# Patient Record
Sex: Male | Born: 1950 | Race: Black or African American | Hispanic: No | Marital: Married | Smoking: Former smoker
Health system: Southern US, Community
[De-identification: ages and names within clinical notes are randomized; demographics above are authoritative.]

## PROBLEM LIST (undated history)

## (undated) DIAGNOSIS — M1990 Unspecified osteoarthritis, unspecified site: Secondary | ICD-10-CM

## (undated) DIAGNOSIS — Z720 Tobacco use: Secondary | ICD-10-CM

## (undated) DIAGNOSIS — I10 Essential (primary) hypertension: Secondary | ICD-10-CM

## (undated) DIAGNOSIS — C61 Malignant neoplasm of prostate: Secondary | ICD-10-CM

## (undated) HISTORY — PX: PROSTATE BIOPSY: SHX241

## (undated) HISTORY — PX: COLONOSCOPY: SHX174

## (undated) HISTORY — DX: Tobacco use: Z72.0

## (undated) HISTORY — DX: Malignant neoplasm of prostate: C61

## (undated) HISTORY — PX: OTHER SURGICAL HISTORY: SHX169

## (undated) HISTORY — PX: ROTATOR CUFF REPAIR: SHX139

## (undated) HISTORY — PX: BACK SURGERY: SHX140

## (undated) HISTORY — PX: WRIST SURGERY: SHX841

---

## 1997-07-14 ENCOUNTER — Ambulatory Visit (HOSPITAL_COMMUNITY): Admission: RE | Admit: 1997-07-14 | Discharge: 1997-07-14 | Payer: Self-pay | Admitting: Cardiology

## 1997-07-15 ENCOUNTER — Ambulatory Visit (HOSPITAL_COMMUNITY): Admission: RE | Admit: 1997-07-15 | Discharge: 1997-07-15 | Payer: Self-pay | Admitting: Cardiology

## 1997-12-13 ENCOUNTER — Encounter: Payer: Self-pay | Admitting: Neurosurgery

## 1997-12-13 ENCOUNTER — Inpatient Hospital Stay (HOSPITAL_COMMUNITY): Admission: RE | Admit: 1997-12-13 | Discharge: 1997-12-16 | Payer: Self-pay | Admitting: Neurosurgery

## 1997-12-30 ENCOUNTER — Emergency Department (HOSPITAL_COMMUNITY): Admission: EM | Admit: 1997-12-30 | Discharge: 1997-12-30 | Payer: Self-pay | Admitting: Emergency Medicine

## 1999-04-30 ENCOUNTER — Encounter: Payer: Self-pay | Admitting: Cardiology

## 1999-04-30 ENCOUNTER — Encounter: Admission: RE | Admit: 1999-04-30 | Discharge: 1999-04-30 | Payer: Self-pay | Admitting: Cardiology

## 1999-06-03 ENCOUNTER — Encounter: Payer: Self-pay | Admitting: Neurosurgery

## 1999-06-03 ENCOUNTER — Ambulatory Visit (HOSPITAL_COMMUNITY): Admission: RE | Admit: 1999-06-03 | Discharge: 1999-06-03 | Payer: Self-pay | Admitting: Neurosurgery

## 2000-05-01 ENCOUNTER — Encounter: Admission: RE | Admit: 2000-05-01 | Discharge: 2000-05-01 | Payer: Self-pay | Admitting: Cardiology

## 2000-05-01 ENCOUNTER — Encounter: Payer: Self-pay | Admitting: Cardiology

## 2003-02-25 ENCOUNTER — Encounter: Admission: RE | Admit: 2003-02-25 | Discharge: 2003-02-25 | Payer: Self-pay | Admitting: Cardiology

## 2003-05-20 ENCOUNTER — Ambulatory Visit (HOSPITAL_BASED_OUTPATIENT_CLINIC_OR_DEPARTMENT_OTHER): Admission: RE | Admit: 2003-05-20 | Discharge: 2003-05-20 | Payer: Self-pay | Admitting: Urology

## 2003-05-20 ENCOUNTER — Ambulatory Visit (HOSPITAL_COMMUNITY): Admission: RE | Admit: 2003-05-20 | Discharge: 2003-05-20 | Payer: Self-pay | Admitting: Urology

## 2007-02-26 ENCOUNTER — Encounter: Admission: RE | Admit: 2007-02-26 | Discharge: 2007-02-26 | Payer: Self-pay | Admitting: Cardiology

## 2009-06-28 ENCOUNTER — Ambulatory Visit (HOSPITAL_BASED_OUTPATIENT_CLINIC_OR_DEPARTMENT_OTHER): Admission: RE | Admit: 2009-06-28 | Discharge: 2009-06-28 | Payer: Self-pay | Admitting: Orthopedic Surgery

## 2010-04-16 LAB — POCT HEMOGLOBIN-HEMACUE: Hemoglobin: 15.7 g/dL (ref 13.0–17.0)

## 2010-04-16 LAB — BASIC METABOLIC PANEL
BUN: 12 mg/dL (ref 6–23)
CO2: 25 mEq/L (ref 19–32)
Calcium: 9.2 mg/dL (ref 8.4–10.5)
Chloride: 109 mEq/L (ref 96–112)
Creatinine, Ser: 0.95 mg/dL (ref 0.4–1.5)
GFR calc Af Amer: >60 mL/min (ref >60)
GFR calc non Af Amer: >60 mL/min (ref >60)
Glucose, Bld: 86 mg/dL (ref 70–99)
Potassium: 3.5 mEq/L (ref 3.5–5.1)
Sodium: 139 mEq/L (ref 135–145)

## 2010-06-15 NOTE — Op Note (Signed)
Jason [name], Jason [name]                         ACCOUNT NO.:  [id number]   MEDICAL RECORD NO.:  [id number]                   PATIENT TYPE:  AMB   LOCATION:  [facility]                                 FACILITY:  [facility]   PHYSICIAN:  Lindaann Slough, M.D.               DATE OF BIRTH:  05-27-1950   DATE OF PROCEDURE:  05/20/2003  DATE OF DISCHARGE:                                 OPERATIVE REPORT   PREOPERATIVE DIAGNOSIS:  Left hydrocele.   POSTOPERATIVE DIAGNOSIS:  Left hydrocele.   PROCEDURE:  Left hydrocelectomy.   SURGEON:  Lindaann Slough, M.D.   ANESTHESIA:  General.   INDICATIONS:  Patient is a 60 year old male who had been complaining of  swelling on the left scrotum, associated with pain.  He was found on  physical examination to have a large left hydrocele.  He is scheduled today  for a left hydrocelectomy.   Under general anesthesia, the patient was prepped and draped and placed in  the supine position.  The scrotum was infiltrated with 0.25% Marcaine.  Then  a longitudinal incision was made on the scrotum.  The incision was carried  down the tunica vaginalis, which was then incised.  About 500 cc of fluid  was drained out of the hydrocele sac.  Then the tunica vaginalis was  plicated using the Lord's technique with #3-0 chromic.  Hemostasis was  secured with electrocautery.  Then the scrotum was closed in two layers with  3-0 chromic.   Patient tolerated the procedure well and left the OR in satisfactory  condition to post anesthesia care unit.                                               Lindaann Slough, M.D.    MN/MEDQ  D:  05/20/2003  T:  05/20/2003  Job:  [id number]   cc:   Osvaldo Shipper. Spruill, M.D.  [address] [address]  [place]  [place] [address]  Fax: [phone]

## 2011-12-19 ENCOUNTER — Other Ambulatory Visit: Payer: Self-pay | Admitting: Cardiology

## 2011-12-19 ENCOUNTER — Ambulatory Visit
Admission: RE | Admit: 2011-12-19 | Discharge: 2011-12-19 | Disposition: A | Discharge status: Home / Self Care | Payer: BC Managed Care – PPO | Source: Ambulatory Visit | Attending: Cardiology | Admitting: Cardiology

## 2011-12-19 DIAGNOSIS — I10 Essential (primary) hypertension: Secondary | ICD-10-CM

## 2012-07-20 ENCOUNTER — Other Ambulatory Visit: Payer: Self-pay | Admitting: Cardiology

## 2012-07-20 DIAGNOSIS — M25511 Pain in right shoulder: Secondary | ICD-10-CM

## 2012-07-24 ENCOUNTER — Ambulatory Visit
Admission: RE | Admit: 2012-07-24 | Discharge: 2012-07-24 | Disposition: A | Discharge status: Home / Self Care | Payer: BC Managed Care – PPO | Source: Ambulatory Visit | Attending: Cardiology | Admitting: Cardiology

## 2012-07-24 DIAGNOSIS — M25511 Pain in right shoulder: Secondary | ICD-10-CM

## 2013-10-25 ENCOUNTER — Ambulatory Visit
Admission: RE | Admit: 2013-10-25 | Discharge: 2013-10-25 | Disposition: A | Discharge status: Home / Self Care | Payer: BC Managed Care – PPO | Source: Ambulatory Visit | Attending: Cardiology | Admitting: Cardiology

## 2013-10-25 ENCOUNTER — Other Ambulatory Visit: Payer: Self-pay | Admitting: Cardiology

## 2013-10-25 DIAGNOSIS — I158 Other secondary hypertension: Secondary | ICD-10-CM

## 2018-01-05 DIAGNOSIS — I10 Essential (primary) hypertension: Secondary | ICD-10-CM | POA: Diagnosis not present

## 2018-01-05 DIAGNOSIS — J209 Acute bronchitis, unspecified: Secondary | ICD-10-CM | POA: Diagnosis not present

## 2018-02-03 DIAGNOSIS — Z1211 Encounter for screening for malignant neoplasm of colon: Secondary | ICD-10-CM | POA: Diagnosis not present

## 2018-02-03 DIAGNOSIS — Z23 Encounter for immunization: Secondary | ICD-10-CM | POA: Diagnosis not present

## 2018-02-03 DIAGNOSIS — Z720 Tobacco use: Secondary | ICD-10-CM | POA: Diagnosis not present

## 2018-02-03 DIAGNOSIS — Z79899 Other long term (current) drug therapy: Secondary | ICD-10-CM | POA: Diagnosis not present

## 2018-02-03 DIAGNOSIS — I10 Essential (primary) hypertension: Secondary | ICD-10-CM | POA: Diagnosis not present

## 2018-09-15 DIAGNOSIS — R972 Elevated prostate specific antigen [PSA]: Secondary | ICD-10-CM | POA: Diagnosis not present

## 2018-10-16 DIAGNOSIS — D125 Benign neoplasm of sigmoid colon: Secondary | ICD-10-CM | POA: Diagnosis not present

## 2018-10-16 DIAGNOSIS — K5730 Diverticulosis of large intestine without perforation or abscess without bleeding: Secondary | ICD-10-CM | POA: Diagnosis not present

## 2018-10-16 DIAGNOSIS — D123 Benign neoplasm of transverse colon: Secondary | ICD-10-CM | POA: Diagnosis not present

## 2018-10-16 DIAGNOSIS — D122 Benign neoplasm of ascending colon: Secondary | ICD-10-CM | POA: Diagnosis not present

## 2018-10-16 DIAGNOSIS — K635 Polyp of colon: Secondary | ICD-10-CM | POA: Diagnosis not present

## 2018-10-16 DIAGNOSIS — Z86010 Personal history of colonic polyps: Secondary | ICD-10-CM | POA: Diagnosis not present

## 2019-06-24 DIAGNOSIS — F17200 Nicotine dependence, unspecified, uncomplicated: Secondary | ICD-10-CM | POA: Insufficient documentation

## 2019-06-24 DIAGNOSIS — J301 Allergic rhinitis due to pollen: Secondary | ICD-10-CM | POA: Insufficient documentation

## 2020-04-12 ENCOUNTER — Other Ambulatory Visit: Payer: Self-pay | Admitting: *Deleted

## 2020-04-12 DIAGNOSIS — F17210 Nicotine dependence, cigarettes, uncomplicated: Secondary | ICD-10-CM

## 2020-04-12 DIAGNOSIS — Z87891 Personal history of nicotine dependence: Secondary | ICD-10-CM

## 2020-05-05 ENCOUNTER — Other Ambulatory Visit: Payer: Self-pay | Admitting: Nephrology

## 2020-05-05 DIAGNOSIS — N1832 Chronic kidney disease, stage 3b: Secondary | ICD-10-CM

## 2020-05-18 ENCOUNTER — Other Ambulatory Visit: Payer: BC Managed Care – PPO

## 2020-05-22 ENCOUNTER — Other Ambulatory Visit: Payer: Self-pay

## 2020-05-22 ENCOUNTER — Ambulatory Visit (INDEPENDENT_AMBULATORY_CARE_PROVIDER_SITE_OTHER): Payer: BC Managed Care – PPO | Admitting: Acute Care

## 2020-05-22 ENCOUNTER — Encounter: Payer: Self-pay | Admitting: Acute Care

## 2020-05-22 ENCOUNTER — Telehealth: Payer: Self-pay | Admitting: Acute Care

## 2020-05-22 ENCOUNTER — Ambulatory Visit
Admission: RE | Admit: 2020-05-22 | Discharge: 2020-05-22 | Disposition: A | Discharge status: Home / Self Care | Payer: BC Managed Care – PPO | Source: Ambulatory Visit | Attending: Acute Care | Admitting: Acute Care

## 2020-05-22 VITALS — BP 118/74 | HR 102 | Temp 98.0°F | Ht 70.0 in | Wt 187.4 lb

## 2020-05-22 DIAGNOSIS — F17210 Nicotine dependence, cigarettes, uncomplicated: Secondary | ICD-10-CM

## 2020-05-22 DIAGNOSIS — Z87891 Personal history of nicotine dependence: Secondary | ICD-10-CM

## 2020-05-22 DIAGNOSIS — Z122 Encounter for screening for malignant neoplasm of respiratory organs: Secondary | ICD-10-CM

## 2020-05-22 NOTE — Progress Notes (Signed)
Shared Decision Making Visit Lung Cancer Screening Program [phone])   Eligibility:  Age 70 y.o.  Pack Years Smoking History Calculation 52 pack year smoking history (# packs/per year x # years smoked)  Recent History of coughing up blood  no  Unexplained weight loss? no ( >Than 15 pounds within the last 6 months )  Prior History Lung / other cancer no (Diagnosis within the last 5 years already requiring surveillance chest CT Scans).  Smoking Status Current Smoker  Former Smokers: Years since quit: NA  Quit Date: NA  Visit Components:  Discussion included one or more decision making aids. yes  Discussion included risk/benefits of screening. yes  Discussion included potential follow up diagnostic testing for abnormal scans. yes  Discussion included meaning and risk of over diagnosis. yes  Discussion included meaning and risk of False Positives. yes  Discussion included meaning of total radiation exposure. yes  Counseling Included:  Importance of adherence to annual lung cancer LDCT screening. yes  Impact of comorbidities on ability to participate in the program. yes  Ability and willingness to under diagnostic treatment. yes  Smoking Cessation Counseling:  Current Smokers:   Discussed importance of smoking cessation. yes  Information about tobacco cessation classes and interventions provided to patient. yes  Patient provided with "ticket" for LDCT Scan. yes  Symptomatic Patient. no  Counseling  Diagnosis Code: Tobacco Use Z72.0  Asymptomatic Patient yes  Counseling (Intermediate counseling: > three minutes counseling) N0539  Former Smokers:   Discussed the importance of maintaining cigarette abstinence. yes  Diagnosis Code: Personal History of Nicotine Dependence. J67.341  Information about tobacco cessation classes and interventions provided to patient. Yes  Patient provided with "ticket" for LDCT Scan. yes  Written Order for Lung Cancer  Screening with LDCT placed in Epic. Yes (CT Chest Lung Cancer Screening Low Dose W/O CM) [id number] Z12.2-Screening of respiratory organs Z87.891-Personal history of nicotine dependence\  I have spent 25 minutes of face to face time with Mr. Masini discussing the risks and benefits of lung cancer screening. We viewed a power point together that explained in detail the above noted topics. We paused at intervals to allow for questions to be asked and answered to ensure understanding.We discussed that the single most powerful action that he can take to decrease his risk of developing lung cancer is to quit smoking. We discussed whether or not he is ready to commit to setting a quit date. We discussed options for tools to aid in quitting smoking including nicotine replacement therapy, non-nicotine medications, support groups, Quit Smart classes, and behavior modification. We discussed that often times setting smaller, more achievable goals, such as eliminating 1 cigarette a day for a week and then 2 cigarettes a day for a week can be helpful in slowly decreasing the number of cigarettes smoked. This allows for a sense of accomplishment as well as providing a clinical benefit. I gave him the " Be Stronger Than Your Excuses" card with contact information for community resources, classes, free nicotine replacement therapy, and access to mobile apps, text messaging, and on-line smoking cessation help. I have also given him my card and contact information in the event he needs to contact me. We discussed the time and location of the scan, and that either Doroteo Glassman RN or I will call with the results within 24-48 hours of receiving them. I have offered him  a copy of the power point we viewed  as a resource in the event they need reinforcement of  the concepts we discussed today in the office. The patient verbalized understanding of all of  the above and had no further questions upon leaving the office. They have my  contact information in the event they have any further questions.  I spent 3-4 minutes counseling on smoking cessation and the health risks of continued tobacco abuse.  I explained to the patient that there has been a high incidence of coronary artery disease noted on these exams. I explained that this is a non-gated exam therefore degree or severity cannot be determined. This patient is currently on statin therapy. I have asked the patient to follow-up with their PCP regarding any incidental finding of coronary artery disease and management with diet or medication as their PCP  feels is clinically indicated. The patient verbalized understanding of the above and had no further questions upon completion of the visit.      Magdalen Spatz, NP 05/22/2020 9:40 AM

## 2020-05-22 NOTE — Telephone Encounter (Signed)
Spoke with Olivia Mackie from [place]. She was calling in regards to patient's lung cancer screening CT. Below are the impressions:   IMPRESSION: 1. Spiculated 21.7 mm apical segment right upper lobe nodule, highly worrisome for adenocarcinoma. Lung-RADS 4X, highly suspicious. Additional imaging evaluation or consultation with Pulmonology or Thoracic Surgery recommended. These results will be called to the ordering clinician or representative by the Radiologist Assistant, and communication documented in the PACS or [employer]. 2. Enlarged low right paratracheal and AP window lymph nodes, worrisome for metastatic disease. Enlarged mediastinal lymph nodes can also be seen in the setting of interstitial lung disease. 3. Lower lung zone predominant coarsened pulmonary parenchymal ground-glass and bronchiectasis, findings which can be seen with interstitial lung disease such as chronic hypersensitivity pneumonitis or nonspecific interstitial pneumonitis. 4.  Aortic atherosclerosis (ICD10-I70.0). 5.  Emphysema (ICD10-J43.9).  SG, can you please advise? Thanks!

## 2020-05-22 NOTE — Patient Instructions (Signed)
Thank you for participating in the [facility] Lung Cancer Screening Program. It was our pleasure to meet you today. We will call you with the results of your scan within the next few days. Your scan will be assigned a Lung RADS category score by the physicians reading the scans.  This Lung RADS score determines follow up scanning.  See below for description of categories, and follow up screening recommendations. We will be in touch to schedule your follow up screening annually or based on recommendations of our providers. We will fax a copy of your scan results to your Primary Care Physician, or the physician who referred you to the program, to ensure they have the results. Please call the office if you have any questions or concerns regarding your scanning experience or results.  Our office number is [phone]. Please speak with Denise Phelps, RN. She is our Lung Cancer Screening RN. If she is unavailable when you call, please have the office staff send her a message. She will return your call at her earliest convenience. Remember, if your scan is normal, we will scan you annually as long as you continue to meet the criteria for the program. (Age 55-77, Current smoker or smoker who has quit within the last 15 years). If you are a smoker, remember, quitting is the single most powerful action that you can take to decrease your risk of lung cancer and other pulmonary, breathing related problems. We know quitting is hard, and we are here to help.  Please let us know if there is anything we can do to help you meet your goal of quitting. If you are a former smoker, congratulations. We are proud of you! Remain smoke free! Remember you can refer friends or family members through the number above.  We will screen them to make sure they meet criteria for the program. Thank you for helping us take better care of you by participating in Lung Screening.  Lung RADS Categories:  Lung RADS 1: no nodules  or definitely non-concerning nodules.  Recommendation is for a repeat annual scan in 12 months.  Lung RADS 2:  nodules that are non-concerning in appearance and behavior with a very low likelihood of becoming an active cancer. Recommendation is for a repeat annual scan in 12 months.  Lung RADS 3: nodules that are probably non-concerning , includes nodules with a low likelihood of becoming an active cancer.  Recommendation is for a 6-month repeat screening scan. Often noted after an upper respiratory illness. We will be in touch to make sure you have no questions, and to schedule your 6-month scan.  Lung RADS 4 A: nodules with concerning findings, recommendation is most often for a follow up scan in 3 months or additional testing based on our provider's assessment of the scan. We will be in touch to make sure you have no questions and to schedule the recommended 3 month follow up scan.  Lung RADS 4 B:  indicates findings that are concerning. We will be in touch with you to schedule additional diagnostic testing based on our provider's  assessment of the scan.   

## 2020-05-23 ENCOUNTER — Other Ambulatory Visit: Payer: Self-pay | Admitting: Acute Care

## 2020-05-23 DIAGNOSIS — R911 Solitary pulmonary nodule: Secondary | ICD-10-CM

## 2020-05-23 NOTE — Progress Notes (Signed)
I have called the patient with the results of his low dose Ct. I explained that his scan was read as a Lung RADS 4 X which  indicates highly suspicious findings for which additional diagnostic testing and or tissue sampling is recommended. I explained that the next steps in his work up would be a PET scan and PFT's.I told him someone would be calling to get these scheduled as soon as possible. I have placed the orders. He has verbalized understanding, and had no questions prior to completion of the call. He is awaiting a call to schedule additional diagnostics. Langley Gauss, please fax results to PCP and let them know we are handling additional diagnostic work up. Thanks so much

## 2020-05-23 NOTE — Telephone Encounter (Signed)
This will be called through the screening program. Thanks so much. 

## 2020-05-23 NOTE — Progress Notes (Signed)
I have attempted to call the patient with the results of his low dose CT. There was no answer. I have asked the patient to call the office at [phone] for the results of his scan. I will await his return call.   Langley Gauss, if he calls the office today, please text me so I can return his call in a timely manner. Thanks so much

## 2020-05-23 NOTE — Telephone Encounter (Signed)
Noted.  Will close encounter.  

## 2020-05-25 ENCOUNTER — Telehealth: Payer: Self-pay | Admitting: Acute Care

## 2020-05-25 NOTE — Telephone Encounter (Signed)
Left message for pt to call to schedule PFT with CT prior.

## 2020-05-29 NOTE — Telephone Encounter (Signed)
Spoke with pt and scheduled Covid test and PFT. Pt verbalized understanding. Nothing further needed.

## 2020-05-29 NOTE — Telephone Encounter (Signed)
LMTC x 1  

## 2020-05-31 ENCOUNTER — Other Ambulatory Visit: Payer: Self-pay

## 2020-05-31 ENCOUNTER — Ambulatory Visit (INDEPENDENT_AMBULATORY_CARE_PROVIDER_SITE_OTHER): Payer: Medicare Other | Admitting: Pulmonary Disease

## 2020-05-31 ENCOUNTER — Encounter: Payer: Self-pay | Admitting: Pulmonary Disease

## 2020-05-31 ENCOUNTER — Telehealth: Payer: Self-pay | Admitting: Pulmonary Disease

## 2020-05-31 VITALS — BP 116/64 | HR 68 | Temp 98.1°F | Ht 70.0 in | Wt 190.0 lb

## 2020-05-31 DIAGNOSIS — Z87891 Personal history of nicotine dependence: Secondary | ICD-10-CM

## 2020-05-31 DIAGNOSIS — R918 Other nonspecific abnormal finding of lung field: Secondary | ICD-10-CM

## 2020-05-31 DIAGNOSIS — R590 Localized enlarged lymph nodes: Secondary | ICD-10-CM

## 2020-05-31 DIAGNOSIS — J432 Centrilobular emphysema: Secondary | ICD-10-CM

## 2020-05-31 NOTE — Patient Instructions (Signed)
Thank you for visiting Dr. Valeta Harms at [facility]. Today we recommend the following:  Orders Placed This Encounter  Procedures  . Procedural/ Surgical Case Request: VIDEO BRONCHOSCOPY WITH ENDOBRONCHIAL NAVIGATION, VIDEO BRONCHOSCOPY WITH ENDOBRONCHIAL ULTRASOUND  . Ambulatory referral to Pulmonology   Tentative Date: 06/06/2020 for bronchoscopy  Expect calls from scheduling as well as pre-op services.   Return in about 6 weeks (around 07/12/2020).    Please do your part to reduce the spread of COVID-19.

## 2020-05-31 NOTE — Telephone Encounter (Signed)
I scheduled pt for ENB/EBUS on 5/10 at 11:00 at [facility].  Pt scheduled for covid test on 5/9.  CT is scheduled for 5/6 at [facility].  I called & spoke to [facility] and she is going to have disk sent to [facility].  I spoke to pt & gave him appt info.

## 2020-05-31 NOTE — H&P (View-Only) (Signed)
Synopsis: Referred in May 2022 for lung nodule by Lujean Amel, MD  Subjective:   PATIENT ID: Jason [name]. GENDER: male DOB: November 07, 1950, MRN: [id number]  Chief Complaint  Patient presents with  . Consult    Pt is being referred by Eric Form, NP due to lung mass seen on CT.  Pt denies any complaints of SOB. States that he does have an occasional cough.    70 yo M, PMH prostate cancer (recent dx),, tobacco abuse, smoker for 30 years.  History of back surgery.  No family history of lung cancer.  Patient was enrolled in our lung cancer screening program.  First lung cancer screening image was 05/22/2020 which revealed a right upper lobe 2.1 cm cavitary nodule with associated paraseptal emphysema and a low right paratracheal lymph node measuring 11 mm.  Concerning for a primary bronchogenic carcinoma.  Patient has also associated centrilobular emphysema.  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  Patient denies shortness of breath or weight loss.  He has had general anesthesia before for a back surgery.  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     Past Medical History:  Diagnosis Date  . Prostate cancer ([place])   . Tobacco use      Family History  Problem Relation Age of Onset  . Heart failure Mother      Past Surgical History:  Procedure Laterality Date  . BACK SURGERY    . PROSTATE BIOPSY      Social History   Socioeconomic History  . Marital status: Married    Spouse name: Not on file  . Number of children: Not on file  . Years of education: Not on file  . Highest education level: Not on file  Occupational History  . Not on file  Tobacco Use  . Smoking status: Former Smoker    Packs/day: 1.50    Years: 35.00    Pack years: 52.50    Types: Cigarettes    Start date: 80    Quit date: 05/26/2020    Years since quitting: 0.0  . Smokeless tobacco: Never Used  Substance and Sexual Activity  .  Alcohol use: Not on file  . Drug use: Not on file  . Sexual activity: Not on file  Other Topics Concern  . Not on file  Social History Narrative  . Not on file   Social Determinants of Health   Financial Resource Strain: Not on file  Food Insecurity: Not on file  Transportation Needs: Not on file  Physical Activity: Not on file  Stress: Not on file  Social Connections: Not on file  Intimate Partner Violence: Not on file     Not on File   Outpatient Medications Prior to Visit  Medication Sig Dispense Refill  . atorvastatin (LIPITOR) 10 MG tablet Take 1 tablet by mouth daily.    Marland Kitchen azelastine (OPTIVAR) 0.05 % ophthalmic solution SMARTSIG:1 Drop(s) In Eye(s) Twice Daily PRN    . fluticasone (FLONASE) 50 MCG/ACT nasal spray 2 sprays    . levocetirizine (XYZAL) 5 MG tablet 1 tablet in the evening    . losartan-hydrochlorothiazide (HYZAAR) 100-12.5 MG tablet Take 1 tablet by mouth daily.    . montelukast (SINGULAIR) 10 MG tablet 1 tablet    . valsartan-hydrochlorothiazide (DIOVAN-HCT) 320-25 MG tablet Take 1 tablet by mouth daily.     No facility-administered medications prior to visit.    Review of  Systems  Constitutional: Negative for chills, fever, malaise/fatigue and weight loss.  HENT: Negative for hearing loss, sore throat and tinnitus.   Eyes: Negative for blurred vision and double vision.  Respiratory: Negative for cough, hemoptysis, sputum production, shortness of breath, wheezing and stridor.   Cardiovascular: Negative for chest pain, palpitations, orthopnea, leg swelling and PND.  Gastrointestinal: Negative for abdominal pain, constipation, diarrhea, heartburn, nausea and vomiting.  Genitourinary: Negative for dysuria, hematuria and urgency.  Musculoskeletal: Negative for joint pain and myalgias.  Skin: Negative for itching and rash.  Neurological: Negative for dizziness, tingling, weakness and headaches.  Endo/Heme/Allergies: Negative for environmental allergies.  Does not bruise/bleed easily.  Psychiatric/Behavioral: Negative for depression. The patient is not nervous/anxious and does not have insomnia.   All other systems reviewed and are negative.    Objective:  Physical Exam Vitals reviewed.  Constitutional:      General: He is not in acute distress.    Appearance: He is well-developed.  HENT:     Head: Normocephalic and atraumatic.  Eyes:     General: No scleral icterus.    Conjunctiva/sclera: Conjunctivae normal.     Pupils: Pupils are equal, round, and reactive to light.  Neck:     Vascular: No JVD.     Trachea: No tracheal deviation.  Cardiovascular:     Rate and Rhythm: Normal rate and regular rhythm.     Heart sounds: Normal heart sounds. No murmur heard.   Pulmonary:     Effort: Pulmonary effort is normal. No tachypnea, accessory muscle usage or respiratory distress.     Breath sounds: Normal breath sounds. No stridor. No wheezing, rhonchi or rales.  Abdominal:     General: Bowel sounds are normal. There is no distension.     Palpations: Abdomen is soft.     Tenderness: There is no abdominal tenderness.  Musculoskeletal:        General: No tenderness.     Cervical back: Neck supple.  Lymphadenopathy:     Cervical: No cervical adenopathy.  Skin:    General: Skin is warm and dry.     Capillary Refill: Capillary refill takes less than 2 seconds.     Findings: No rash.  Neurological:     Mental Status: He is alert and oriented to person, place, and time.  Psychiatric:        Behavior: Behavior normal.      Vitals:   05/31/20 1339  BP: 116/64  Pulse: 68  Temp: 98.1 F (36.7 C)  TempSrc: Temporal  SpO2: 97%  Weight: 190 lb (86.2 kg)  Height: 5\' 10"  (1.778 m)   97% on RA BMI Readings from Last 3 Encounters:  05/31/20 27.26 kg/m  05/22/20 26.89 kg/m   Wt Readings from Last 3 Encounters:  05/31/20 190 lb (86.2 kg)  05/22/20 187 lb 6.4 oz (85 kg)     CBC    Component Value Date/Time   HGB 15.7  06/28/2009 0804    Chest Imaging: 05/22/2020: CT lung cancer screening 2.1 cm right upper lobe apical cavitary nodule concerning for primary bronchogenic carcinoma with associated mediastinal adenopathy concerning for an advanced stage disease. The patient's images have been independently reviewed by me.    Pulmonary Functions Testing Results: No flowsheet data found.  FeNO:   Pathology:   Echocardiogram:   Heart Catheterization:     Assessment & Plan:     ICD-10-CM   1. Lung mass  R91.8 Ambulatory referral to Pulmonology    Procedural/  Surgical Case Request: VIDEO BRONCHOSCOPY WITH ENDOBRONCHIAL NAVIGATION, VIDEO BRONCHOSCOPY WITH ENDOBRONCHIAL ULTRASOUND  2. Mediastinal adenopathy  R59.0   3. Former smoker  Z87.891   4. Centrilobular emphysema ([place])  J43.2     Discussion:  This is a 70 year old gentleman, recent diagnosis of a 2 cm right upper lobe lung nodule with associated mediastinal adenopathy concerning for an advanced age bronchogenic carcinoma.  Also has associated centrilobular emphysema.  He is a former smoker recently quit after his CT scan findings.  Also with a new diagnosis of prostate cancer.  Plan: Discussed risks benefits and alternatives to bronchoscopy  PET scan scheduled  SUPER D CT scheduled  Tentative bronch on 06/06/2020 Orders placed.  Plans for ENB + EBUS, if nodes neg and pet re-assuring patient may be a surgical candidate.     Current Outpatient Medications:  .  atorvastatin (LIPITOR) 10 MG tablet, Take 1 tablet by mouth daily., Disp: , Rfl:  .  azelastine (OPTIVAR) 0.05 % ophthalmic solution, SMARTSIG:1 Drop(s) In Eye(s) Twice Daily PRN, Disp: , Rfl:  .  fluticasone (FLONASE) 50 MCG/ACT nasal spray, 2 sprays, Disp: , Rfl:  .  levocetirizine (XYZAL) 5 MG tablet, 1 tablet in the evening, Disp: , Rfl:  .  losartan-hydrochlorothiazide (HYZAAR) 100-12.5 MG tablet, Take 1 tablet by mouth daily., Disp: , Rfl:  .  montelukast (SINGULAIR) 10 MG  tablet, 1 tablet, Disp: , Rfl:  .  valsartan-hydrochlorothiazide (DIOVAN-HCT) 320-25 MG tablet, Take 1 tablet by mouth daily., Disp: , Rfl:   I spent 62 minutes dedicated to the care of this patient on the date of this encounter to include pre-visit review of records, face-to-face time with the patient discussing conditions above, post visit ordering of testing, clinical documentation with the electronic health record, making appropriate referrals as documented, and communicating necessary findings to members of the patients care team.   Garner Nash, [facility] 05/31/2020 2:05 PM

## 2020-05-31 NOTE — Progress Notes (Signed)
Synopsis: Referred in May 2022 for lung nodule by Lujean Amel, MD  Subjective:   PATIENT ID: Jason [name]. GENDER: male DOB: 07-15-1950, MRN: [id number]  Chief Complaint  Patient presents with  . Consult    Pt is being referred by Eric Form, NP due to lung mass seen on CT.  Pt denies any complaints of SOB. States that he does have an occasional cough.    70 yo M, PMH prostate cancer (recent dx),, tobacco abuse, smoker for 30 years.  History of back surgery.  No family history of lung cancer.  Patient was enrolled in our lung cancer screening program.  First lung cancer screening image was 05/22/2020 which revealed a right upper lobe 2.1 cm cavitary nodule with associated paraseptal emphysema and a low right paratracheal lymph node measuring 11 mm.  Concerning for a primary bronchogenic carcinoma.  Patient has also associated centrilobular emphysema.  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  Patient denies shortness of breath or weight loss.  He has had general anesthesia before for a back surgery.  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     Past Medical History:  Diagnosis Date  . Prostate cancer ([place])   . Tobacco use      Family History  Problem Relation Age of Onset  . Heart failure Mother      Past Surgical History:  Procedure Laterality Date  . BACK SURGERY    . PROSTATE BIOPSY      Social History   Socioeconomic History  . Marital status: Married    Spouse name: Not on file  . Number of children: Not on file  . Years of education: Not on file  . Highest education level: Not on file  Occupational History  . Not on file  Tobacco Use  . Smoking status: Former Smoker    Packs/day: 1.50    Years: 35.00    Pack years: 52.50    Types: Cigarettes    Start date: 62    Quit date: 05/26/2020    Years since quitting: 0.0  . Smokeless tobacco: Never Used  Substance and Sexual Activity  .  Alcohol use: Not on file  . Drug use: Not on file  . Sexual activity: Not on file  Other Topics Concern  . Not on file  Social History Narrative  . Not on file   Social Determinants of Health   Financial Resource Strain: Not on file  Food Insecurity: Not on file  Transportation Needs: Not on file  Physical Activity: Not on file  Stress: Not on file  Social Connections: Not on file  Intimate Partner Violence: Not on file     Not on File   Outpatient Medications Prior to Visit  Medication Sig Dispense Refill  . atorvastatin (LIPITOR) 10 MG tablet Take 1 tablet by mouth daily.    Marland Kitchen azelastine (OPTIVAR) 0.05 % ophthalmic solution SMARTSIG:1 Drop(s) In Eye(s) Twice Daily PRN    . fluticasone (FLONASE) 50 MCG/ACT nasal spray 2 sprays    . levocetirizine (XYZAL) 5 MG tablet 1 tablet in the evening    . losartan-hydrochlorothiazide (HYZAAR) 100-12.5 MG tablet Take 1 tablet by mouth daily.    . montelukast (SINGULAIR) 10 MG tablet 1 tablet    . valsartan-hydrochlorothiazide (DIOVAN-HCT) 320-25 MG tablet Take 1 tablet by mouth daily.     No facility-administered medications prior to visit.    Review of  Systems  Constitutional: Negative for chills, fever, malaise/fatigue and weight loss.  HENT: Negative for hearing loss, sore throat and tinnitus.   Eyes: Negative for blurred vision and double vision.  Respiratory: Negative for cough, hemoptysis, sputum production, shortness of breath, wheezing and stridor.   Cardiovascular: Negative for chest pain, palpitations, orthopnea, leg swelling and PND.  Gastrointestinal: Negative for abdominal pain, constipation, diarrhea, heartburn, nausea and vomiting.  Genitourinary: Negative for dysuria, hematuria and urgency.  Musculoskeletal: Negative for joint pain and myalgias.  Skin: Negative for itching and rash.  Neurological: Negative for dizziness, tingling, weakness and headaches.  Endo/Heme/Allergies: Negative for environmental allergies.  Does not bruise/bleed easily.  Psychiatric/Behavioral: Negative for depression. The patient is not nervous/anxious and does not have insomnia.   All other systems reviewed and are negative.    Objective:  Physical Exam Vitals reviewed.  Constitutional:      General: He is not in acute distress.    Appearance: He is well-developed.  HENT:     Head: Normocephalic and atraumatic.  Eyes:     General: No scleral icterus.    Conjunctiva/sclera: Conjunctivae normal.     Pupils: Pupils are equal, round, and reactive to light.  Neck:     Vascular: No JVD.     Trachea: No tracheal deviation.  Cardiovascular:     Rate and Rhythm: Normal rate and regular rhythm.     Heart sounds: Normal heart sounds. No murmur heard.   Pulmonary:     Effort: Pulmonary effort is normal. No tachypnea, accessory muscle usage or respiratory distress.     Breath sounds: Normal breath sounds. No stridor. No wheezing, rhonchi or rales.  Abdominal:     General: Bowel sounds are normal. There is no distension.     Palpations: Abdomen is soft.     Tenderness: There is no abdominal tenderness.  Musculoskeletal:        General: No tenderness.     Cervical back: Neck supple.  Lymphadenopathy:     Cervical: No cervical adenopathy.  Skin:    General: Skin is warm and dry.     Capillary Refill: Capillary refill takes less than 2 seconds.     Findings: No rash.  Neurological:     Mental Status: He is alert and oriented to person, place, and time.  Psychiatric:        Behavior: Behavior normal.      Vitals:   05/31/20 1339  BP: 116/64  Pulse: 68  Temp: 98.1 F (36.7 C)  TempSrc: Temporal  SpO2: 97%  Weight: 190 lb (86.2 kg)  Height: 5\' 10"  (1.778 m)   97% on RA BMI Readings from Last 3 Encounters:  05/31/20 27.26 kg/m  05/22/20 26.89 kg/m   Wt Readings from Last 3 Encounters:  05/31/20 190 lb (86.2 kg)  05/22/20 187 lb 6.4 oz (85 kg)     CBC    Component Value Date/Time   HGB 15.7  06/28/2009 0804    Chest Imaging: 05/22/2020: CT lung cancer screening 2.1 cm right upper lobe apical cavitary nodule concerning for primary bronchogenic carcinoma with associated mediastinal adenopathy concerning for an advanced stage disease. The patient's images have been independently reviewed by me.    Pulmonary Functions Testing Results: No flowsheet data found.  FeNO:   Pathology:   Echocardiogram:   Heart Catheterization:     Assessment & Plan:     ICD-10-CM   1. Lung mass  R91.8 Ambulatory referral to Pulmonology    Procedural/  Surgical Case Request: VIDEO BRONCHOSCOPY WITH ENDOBRONCHIAL NAVIGATION, VIDEO BRONCHOSCOPY WITH ENDOBRONCHIAL ULTRASOUND  2. Mediastinal adenopathy  R59.0   3. Former smoker  Z87.891   4. Centrilobular emphysema ([place])  J43.2     Discussion:  This is a 70 year old gentleman, recent diagnosis of a 2 cm right upper lobe lung nodule with associated mediastinal adenopathy concerning for an advanced age bronchogenic carcinoma.  Also has associated centrilobular emphysema.  He is a former smoker recently quit after his CT scan findings.  Also with a new diagnosis of prostate cancer.  Plan: Discussed risks benefits and alternatives to bronchoscopy  PET scan scheduled  SUPER D CT scheduled  Tentative bronch on 06/06/2020 Orders placed.  Plans for ENB + EBUS, if nodes neg and pet re-assuring patient may be a surgical candidate.     Current Outpatient Medications:  .  atorvastatin (LIPITOR) 10 MG tablet, Take 1 tablet by mouth daily., Disp: , Rfl:  .  azelastine (OPTIVAR) 0.05 % ophthalmic solution, SMARTSIG:1 Drop(s) In Eye(s) Twice Daily PRN, Disp: , Rfl:  .  fluticasone (FLONASE) 50 MCG/ACT nasal spray, 2 sprays, Disp: , Rfl:  .  levocetirizine (XYZAL) 5 MG tablet, 1 tablet in the evening, Disp: , Rfl:  .  losartan-hydrochlorothiazide (HYZAAR) 100-12.5 MG tablet, Take 1 tablet by mouth daily., Disp: , Rfl:  .  montelukast (SINGULAIR) 10 MG  tablet, 1 tablet, Disp: , Rfl:  .  valsartan-hydrochlorothiazide (DIOVAN-HCT) 320-25 MG tablet, Take 1 tablet by mouth daily., Disp: , Rfl:   I spent 62 minutes dedicated to the care of this patient on the date of this encounter to include pre-visit review of records, face-to-face time with the patient discussing conditions above, post visit ordering of testing, clinical documentation with the electronic health record, making appropriate referrals as documented, and communicating necessary findings to members of the patients care team.   Garner Nash, [facility] 05/31/2020 2:05 PM

## 2020-06-02 ENCOUNTER — Ambulatory Visit (HOSPITAL_COMMUNITY)
Admission: RE | Admit: 2020-06-02 | Discharge: 2020-06-02 | Disposition: A | Discharge status: Home / Self Care | Payer: Medicare Other | Source: Ambulatory Visit | Attending: Acute Care | Admitting: Acute Care

## 2020-06-02 ENCOUNTER — Other Ambulatory Visit: Payer: Self-pay

## 2020-06-02 ENCOUNTER — Encounter (HOSPITAL_COMMUNITY): Payer: Self-pay

## 2020-06-02 DIAGNOSIS — R911 Solitary pulmonary nodule: Secondary | ICD-10-CM | POA: Insufficient documentation

## 2020-06-02 DIAGNOSIS — J439 Emphysema, unspecified: Secondary | ICD-10-CM | POA: Insufficient documentation

## 2020-06-02 DIAGNOSIS — I700 Atherosclerosis of aorta: Secondary | ICD-10-CM | POA: Insufficient documentation

## 2020-06-02 DIAGNOSIS — R590 Localized enlarged lymph nodes: Secondary | ICD-10-CM | POA: Diagnosis not present

## 2020-06-02 LAB — GLUCOSE, CAPILLARY: Glucose-Capillary: 90 mg/dL (ref 70–99)

## 2020-06-02 MED ORDER — FLUDEOXYGLUCOSE F - 18 (FDG) INJECTION
9.6300 | Freq: Once | INTRAVENOUS | Status: AC
  Administered 2020-06-02: 9.63 via INTRAVENOUS

## 2020-06-02 NOTE — Progress Notes (Signed)
Pt. Has follow up with Dr. Valeta Harms 5/10 for EBUS for tissue diagnosis.

## 2020-06-05 ENCOUNTER — Other Ambulatory Visit (HOSPITAL_COMMUNITY)
Admission: RE | Admit: 2020-06-05 | Discharge: 2020-06-05 | Disposition: A | Discharge status: Home / Self Care | Payer: Medicare Other | Source: Ambulatory Visit | Attending: Pulmonary Disease | Admitting: Pulmonary Disease

## 2020-06-05 ENCOUNTER — Other Ambulatory Visit (HOSPITAL_COMMUNITY): Payer: BC Managed Care – PPO

## 2020-06-05 ENCOUNTER — Encounter (HOSPITAL_COMMUNITY): Payer: Self-pay | Admitting: Pulmonary Disease

## 2020-06-05 DIAGNOSIS — Z20822 Contact with and (suspected) exposure to covid-19: Secondary | ICD-10-CM | POA: Diagnosis not present

## 2020-06-05 DIAGNOSIS — Z01812 Encounter for preprocedural laboratory examination: Secondary | ICD-10-CM | POA: Insufficient documentation

## 2020-06-05 LAB — SARS CORONAVIRUS 2 (TAT 6-24 HRS): SARS Coronavirus 2: NEGATIVE

## 2020-06-05 NOTE — Progress Notes (Signed)
Spoke with pt for pre-op call. Pt denies cardiac history. Pt is treated for HTN.   Covid test done today, result pending. Pt states he's been in quarantine since the test was done and understands that he stays in quarantine until he comes to the hospital tomorrow.

## 2020-06-06 ENCOUNTER — Encounter (HOSPITAL_COMMUNITY)
Admission: RE | Disposition: A | Discharge status: Home / Self Care | Payer: Self-pay | Source: Home / Self Care | Attending: Pulmonary Disease

## 2020-06-06 ENCOUNTER — Encounter (HOSPITAL_COMMUNITY): Payer: Self-pay | Admitting: Pulmonary Disease

## 2020-06-06 ENCOUNTER — Ambulatory Visit (HOSPITAL_COMMUNITY)
Admission: RE | Admit: 2020-06-06 | Discharge: 2020-06-06 | Disposition: A | Discharge status: Home / Self Care | Payer: Medicare Other | Attending: Pulmonary Disease | Admitting: Pulmonary Disease

## 2020-06-06 ENCOUNTER — Ambulatory Visit (HOSPITAL_COMMUNITY): Payer: Medicare Other | Admitting: Anesthesiology

## 2020-06-06 ENCOUNTER — Ambulatory Visit (HOSPITAL_COMMUNITY): Payer: Medicare Other

## 2020-06-06 ENCOUNTER — Other Ambulatory Visit: Payer: Self-pay

## 2020-06-06 DIAGNOSIS — R918 Other nonspecific abnormal finding of lung field: Secondary | ICD-10-CM | POA: Diagnosis not present

## 2020-06-06 DIAGNOSIS — Z8546 Personal history of malignant neoplasm of prostate: Secondary | ICD-10-CM | POA: Diagnosis not present

## 2020-06-06 DIAGNOSIS — J432 Centrilobular emphysema: Secondary | ICD-10-CM | POA: Diagnosis not present

## 2020-06-06 DIAGNOSIS — Z79899 Other long term (current) drug therapy: Secondary | ICD-10-CM | POA: Diagnosis not present

## 2020-06-06 DIAGNOSIS — R590 Localized enlarged lymph nodes: Secondary | ICD-10-CM | POA: Diagnosis present

## 2020-06-06 DIAGNOSIS — C3411 Malignant neoplasm of upper lobe, right bronchus or lung: Secondary | ICD-10-CM | POA: Insufficient documentation

## 2020-06-06 DIAGNOSIS — Z9889 Other specified postprocedural states: Secondary | ICD-10-CM

## 2020-06-06 DIAGNOSIS — R911 Solitary pulmonary nodule: Secondary | ICD-10-CM | POA: Diagnosis present

## 2020-06-06 DIAGNOSIS — Z87891 Personal history of nicotine dependence: Secondary | ICD-10-CM | POA: Diagnosis not present

## 2020-06-06 HISTORY — DX: Unspecified osteoarthritis, unspecified site: M19.90

## 2020-06-06 HISTORY — PX: BRONCHIAL WASHINGS: SHX5105

## 2020-06-06 HISTORY — PX: BRONCHIAL BIOPSY: SHX5109

## 2020-06-06 HISTORY — PX: BRONCHIAL NEEDLE ASPIRATION BIOPSY: SHX5106

## 2020-06-06 HISTORY — PX: VIDEO BRONCHOSCOPY WITH ENDOBRONCHIAL ULTRASOUND: SHX6177

## 2020-06-06 HISTORY — DX: Essential (primary) hypertension: I10

## 2020-06-06 HISTORY — PX: BRONCHIAL BRUSHINGS: SHX5108

## 2020-06-06 HISTORY — PX: VIDEO BRONCHOSCOPY WITH ENDOBRONCHIAL NAVIGATION: SHX6175

## 2020-06-06 LAB — BASIC METABOLIC PANEL
Anion gap: 5 (ref 5–15)
BUN: 25 mg/dL — ABNORMAL HIGH (ref 8–23)
CO2: 22 mmol/L (ref 22–32)
Calcium: 8.9 mg/dL (ref 8.9–10.3)
Chloride: 108 mmol/L (ref 98–111)
Creatinine, Ser: 1.87 mg/dL — ABNORMAL HIGH (ref 0.61–1.24)
GFR, Estimated: 38 mL/min — ABNORMAL LOW (ref >60)
Glucose, Bld: 97 mg/dL (ref 70–99)
Potassium: 4.2 mmol/L (ref 3.5–5.1)
Sodium: 135 mmol/L (ref 135–145)

## 2020-06-06 SURGERY — VIDEO BRONCHOSCOPY WITH ENDOBRONCHIAL NAVIGATION
Anesthesia: General | Laterality: Right

## 2020-06-06 MED ORDER — ROCURONIUM BROMIDE 10 MG/ML (PF) SYRINGE
PREFILLED_SYRINGE | INTRAVENOUS | Status: DC | PRN
  Administered 2020-06-06: 10 mg via INTRAVENOUS
  Administered 2020-06-06: 60 mg via INTRAVENOUS

## 2020-06-06 MED ORDER — PHENYLEPHRINE 40 MCG/ML (10ML) SYRINGE FOR IV PUSH (FOR BLOOD PRESSURE SUPPORT)
PREFILLED_SYRINGE | INTRAVENOUS | Status: DC | PRN
  Administered 2020-06-06: 60 ug via INTRAVENOUS
  Administered 2020-06-06: 200 ug via INTRAVENOUS
  Administered 2020-06-06: 40 ug via INTRAVENOUS
  Administered 2020-06-06: 120 ug via INTRAVENOUS
  Administered 2020-06-06: 40 ug via INTRAVENOUS

## 2020-06-06 MED ORDER — LACTATED RINGERS IV SOLN: INTRAVENOUS | Status: DC

## 2020-06-06 MED ORDER — CHLORHEXIDINE GLUCONATE 0.12 % MT SOLN
OROMUCOSAL | Status: AC
  Administered 2020-06-06: 15 mL via OROMUCOSAL
  Filled 2020-06-06: qty 15

## 2020-06-06 MED ORDER — FENTANYL CITRATE (PF) 250 MCG/5ML IJ SOLN
INTRAMUSCULAR | Status: DC | PRN
  Administered 2020-06-06: 100 ug via INTRAVENOUS

## 2020-06-06 MED ORDER — MIDAZOLAM HCL 2 MG/2ML IJ SOLN
INTRAMUSCULAR | Status: DC | PRN
  Administered 2020-06-06: 2 mg via INTRAVENOUS

## 2020-06-06 MED ORDER — SUGAMMADEX SODIUM 200 MG/2ML IV SOLN
INTRAVENOUS | Status: DC | PRN
  Administered 2020-06-06: 400 mg via INTRAVENOUS

## 2020-06-06 MED ORDER — ONDANSETRON HCL 4 MG/2ML IJ SOLN
INTRAMUSCULAR | Status: DC | PRN
  Administered 2020-06-06: 4 mg via INTRAVENOUS

## 2020-06-06 MED ORDER — DEXAMETHASONE SODIUM PHOSPHATE 10 MG/ML IJ SOLN
INTRAMUSCULAR | Status: DC | PRN
  Administered 2020-06-06: 10 mg via INTRAVENOUS

## 2020-06-06 MED ORDER — PROPOFOL 10 MG/ML IV BOLUS
INTRAVENOUS | Status: DC | PRN
  Administered 2020-06-06: 20 mg via INTRAVENOUS
  Administered 2020-06-06: 90 mg via INTRAVENOUS

## 2020-06-06 MED ORDER — LIDOCAINE 2% (20 MG/ML) 5 ML SYRINGE
INTRAMUSCULAR | Status: DC | PRN
  Administered 2020-06-06: 60 mg via INTRAVENOUS

## 2020-06-06 MED ORDER — CHLORHEXIDINE GLUCONATE 0.12 % MT SOLN
15.0000 mL | Freq: Once | OROMUCOSAL | Status: AC
  Filled 2020-06-06: qty 15

## 2020-06-06 SURGICAL SUPPLY — 54 items
ADAPTER BRONCH F/PENTAX (ADAPTER) ×4 IMPLANT
ADAPTER VALVE BIOPSY EBUS (MISCELLANEOUS) IMPLANT
ADPR BSCP EDG PNTX (ADAPTER) ×3
ADPTR VALVE BIOPSY EBUS (MISCELLANEOUS)
BRUSH CYTOL CELLEBRITY 1.5X140 (MISCELLANEOUS) ×4 IMPLANT
BRUSH SUPERTRAX BIOPSY (INSTRUMENTS) IMPLANT
BRUSH SUPERTRAX NDL-TIP CYTO (INSTRUMENTS) ×4 IMPLANT
CANISTER SUCT 3000ML PPV (MISCELLANEOUS) ×4 IMPLANT
CHANNEL WORK EXTEND EDGE 180 (KITS) IMPLANT
CHANNEL WORK EXTEND EDGE 45 (KITS) IMPLANT
CHANNEL WORK EXTEND EDGE 90 (KITS) IMPLANT
CONT SPEC 4OZ CLIKSEAL STRL BL (MISCELLANEOUS) ×4 IMPLANT
COVER BACK TABLE 60X90IN (DRAPES) ×4 IMPLANT
COVER DOME SNAP 22 D (MISCELLANEOUS) ×4 IMPLANT
FILTER STRAW FLUID ASPIR (MISCELLANEOUS) IMPLANT
FORCEPS BIOP RJ4 1.8 (CUTTING FORCEPS) IMPLANT
FORCEPS BIOP SUPERTRX PREMAR (INSTRUMENTS) ×4 IMPLANT
GAUZE SPONGE 4X4 12PLY STRL (GAUZE/BANDAGES/DRESSINGS) ×4 IMPLANT
GLOVE BIO SURGEON STRL SZ7.5 (GLOVE) ×4 IMPLANT
GLOVE SURG SS PI 7.5 STRL IVOR (GLOVE) ×8 IMPLANT
GOWN STRL REUS W/ TWL LRG LVL3 (GOWN DISPOSABLE) ×6 IMPLANT
GOWN STRL REUS W/TWL LRG LVL3 (GOWN DISPOSABLE) ×8
KIT CLEAN ENDO COMPLIANCE (KITS) ×8 IMPLANT
KIT LOCATABLE GUIDE (CANNULA) IMPLANT
KIT MARKER FIDUCIAL DELIVERY (KITS) IMPLANT
KIT PROCEDURE EDGE 180 (KITS) IMPLANT
KIT PROCEDURE EDGE 45 (KITS) IMPLANT
KIT PROCEDURE EDGE 90 (KITS) IMPLANT
KIT TURNOVER KIT B (KITS) ×4 IMPLANT
MARKER FIDUCIAL SL 2BAND .9X13 (Implant Marker) IMPLANT
MARKER SKIN DUAL TIP RULER LAB (MISCELLANEOUS) ×4 IMPLANT
NDL EBUS SONO TIP PENTAX (NEEDLE) ×3 IMPLANT
NDL SUPERTRX PREMARK BIOPSY (NEEDLE) ×3 IMPLANT
NEEDLE EBUS SONO TIP PENTAX (NEEDLE) ×4 IMPLANT
NEEDLE SUPERTRX PREMARK BIOPSY (NEEDLE) ×4 IMPLANT
NS IRRIG 1000ML POUR BTL (IV SOLUTION) ×4 IMPLANT
OIL SILICONE PENTAX (PARTS (SERVICE/REPAIRS)) ×4 IMPLANT
PAD ARMBOARD 7.5X6 YLW CONV (MISCELLANEOUS) ×8 IMPLANT
PATCHES PATIENT (LABEL) ×12 IMPLANT
SOL ANTI FOG 6CC (MISCELLANEOUS) ×3 IMPLANT
SOLUTION ANTI FOG 6CC (MISCELLANEOUS) ×1
SYR 20CC LL (SYRINGE) ×8 IMPLANT
SYR 20ML ECCENTRIC (SYRINGE) ×8 IMPLANT
SYR 50ML SLIP (SYRINGE) ×4 IMPLANT
SYR 5ML LUER SLIP (SYRINGE) ×4 IMPLANT
TOWEL OR 17X24 6PK STRL BLUE (TOWEL DISPOSABLE) ×4 IMPLANT
TRAP SPECIMEN MUCOUS 40CC (MISCELLANEOUS) IMPLANT
TUBE CONNECTING 20X1/4 (TUBING) ×8 IMPLANT
UNDERPAD 30X30 (UNDERPADS AND DIAPERS) ×4 IMPLANT
VALVE BIOPSY  SINGLE USE (MISCELLANEOUS) ×4
VALVE BIOPSY SINGLE USE (MISCELLANEOUS) ×3 IMPLANT
VALVE DISPOSABLE (MISCELLANEOUS) ×4 IMPLANT
VALVE SUCTION BRONCHIO DISP (MISCELLANEOUS) ×4 IMPLANT
WATER STERILE IRR 1000ML POUR (IV SOLUTION) ×4 IMPLANT

## 2020-06-06 NOTE — Discharge Instructions (Signed)
Flexible Bronchoscopy, Care After This sheet gives you information about how to care for yourself after your test. Your doctor may also give you more specific instructions. If you have problems or questions, contact your doctor. Follow these instructions at home: Eating and drinking  Do not eat or drink anything (not even water) for 2 hours after your test, or until your numbing medicine (local anesthetic) wears off.  When your numbness is gone and your cough and gag reflexes have come back, you may: ? Eat only soft foods. ? Slowly drink liquids.  The day after the test, go back to your normal diet. Driving  Do not drive for 24 hours if you were given a medicine to help you relax (sedative).  Do not drive or use heavy machinery while taking prescription pain medicine. General instructions   Take over-the-counter and prescription medicines only as told by your doctor.  Return to your normal activities as told. Ask what activities are safe for you.  Do not use any products that have nicotine or tobacco in them. This includes cigarettes and e-cigarettes. If you need help quitting, ask your doctor.  Keep all follow-up visits as told by your doctor. This is important. It is very important if you had a tissue sample (biopsy) taken. Get help right away if:  You have shortness of breath that gets worse.  You get light-headed.  You feel like you are going to pass out (faint).  You have chest pain.  You cough up: ? More than a little blood. ? More blood than before. Summary  Do not eat or drink anything (not even water) for 2 hours after your test, or until your numbing medicine wears off.  Do not use cigarettes. Do not use e-cigarettes.  Get help right away if you have chest pain.   This information is not intended to replace advice given to you by your health care provider. Make sure you discuss any questions you have with your health care provider. Document Released:  11/11/2008 Document Revised: 12/27/2016 Document Reviewed: 02/02/2016 Elsevier Patient Education  2020 [employer].

## 2020-06-06 NOTE — Transfer of Care (Signed)
Immediate Anesthesia Transfer of Care Note  Patient: Jason [name].  Procedure(s) Performed: VIDEO BRONCHOSCOPY WITH ENDOBRONCHIAL NAVIGATION (Right ) VIDEO BRONCHOSCOPY WITH ENDOBRONCHIAL ULTRASOUND (Bilateral ) BRONCHIAL BRUSHINGS BRONCHIAL NEEDLE ASPIRATION BIOPSIES BRONCHIAL BIOPSIES BRONCHIAL WASHINGS  Patient Location: PACU  Anesthesia Type:General  Level of Consciousness: drowsy and patient cooperative  Airway & Oxygen Therapy: Patient Spontanous Breathing  Post-op Assessment: Report given to RN and Post -op Vital signs reviewed and stable  Post vital signs: Reviewed and stable  Last Vitals:  Vitals Value Taken Time  BP 128/80 06/06/20 1232  Temp    Pulse 82 06/06/20 1239  Resp 22 06/06/20 1239  SpO2 98 % 06/06/20 1239  Vitals shown include unvalidated device data.  Last Pain:  Vitals:   06/06/20 0915  PainSc: 0-No pain      Patients Stated Pain Goal: 5 (91/69/45 0388)  Complications: No complications documented.

## 2020-06-06 NOTE — Op Note (Addendum)
Video Bronchoscopy with Electromagnetic Navigation Procedure Note Video Bronchoscopy with Endobronchial Ultrasound Procedure Note  Date of Operation: 06/06/2020  Pre-op Diagnosis: Right upper lobe nodule, adenopathy  Post-op Diagnosis: Right upper lobe nodule, adenopathy  Surgeon: Garner Nash, DO   Assistants: None   Anesthesia: General endotracheal anesthesia  Operation: Flexible video fiberoptic bronchoscopy with electromagnetic navigation and biopsies.  Estimated Blood Loss: Minimal  Complications: None   Indications and History: Jason [name]. is a 70 y.o. male with right upper lobe nodule, adenopathy.  The risks, benefits, complications, treatment options and expected outcomes were discussed with the patient.  The possibilities of pneumothorax, pneumonia, reaction to medication, pulmonary aspiration, perforation of a viscus, bleeding, failure to diagnose a condition and creating a complication requiring transfusion or operation were discussed with the patient who freely signed the consent.    Description of Procedure: The patient was seen in the Preoperative Area, was examined and was deemed appropriate to proceed.  The patient was taken to [facility] endoscopy room 2, identified as Jason [name]. and the procedure verified as Flexible Video Fiberoptic Bronchoscopy.  A Time Out was held and the above information confirmed.   Prior to the date of the procedure a high-resolution CT scan of the chest was performed. Utilizing [place] a virtual tracheobronchial tree was generated to allow the creation of distinct navigation pathways to the patient's parenchymal abnormalities. After being taken to the operating room general anesthesia was initiated and the patient  was orally intubated. The video fiberoptic bronchoscope was introduced via the endotracheal tube and a general inspection was performed which showed normal right and left lung anatomy no evidence of  endobronchial lesion.   Target #1 right upper lobe nodule: The extendable working channel and locator guide were introduced into the bronchoscope. The distinct navigation pathways prepared prior to this procedure were then utilized to navigate to within 0.8 cm of patient's lesion(s) identified on CT scan.  Full fluoroscopic sleep was obtained from RAO 25 degrees to LAO 25 degrees with an inspiratory breath-hold at 20 cm of water for fluoroscopic navigation. The extendable working channel was secured into place and the locator guide was withdrawn. Under fluoroscopic guidance transbronchial needle brushings, transbronchial Wang needle biopsies, and transbronchial forceps biopsies were performed to be sent for cytology and pathology. A bronchioalveolar lavage was performed in the right upper lobe and sent for cytology and microbiology (bacterial, fungal, AFB smears and cultures).  Target #2 right paratracheal node: The standard scope was then withdrawn and the endobronchial ultrasound was used to identify and characterize the peritracheal, hilar and bronchial lymph nodes. Inspection showed enlarged right paratracheal node station 4R. Using real-time ultrasound guidance Wang needle biopsies were take from Station 4R node and were sent for cytology. The patient tolerated the procedure well without apparent complications. There was no significant blood loss.  At the end of the procedure a general airway inspection was performed and there was no evidence of active bleeding.  Standard therapeutic bronchoscope was used for aspiration of the bilateral mainstem's removal of any remaining blood clots and debris.  All distal subsegments were patent at the termination of the procedure and the bronchoscope was removed the patient's airway. The patient tolerated the procedure well. There was no significant blood loss and there were no obvious complications. A post-procedural chest x-ray is pending.  Samples Target #1  right upper lobe: 1. Transbronchial needle brushings from right upper lobe 2. Transbronchial Wang needle biopsies from right upper lobe 3. Transbronchial  forceps biopsies from right upper lobe 4. Bronchoalveolar lavage from right upper lobe  Samples Target #2 right paratracheal node, endobronchial ultrasound: 1. Wang needle biopsies from 4R node  Plans:  The patient will be discharged from the PACU to home when recovered from anesthesia and after chest x-ray is reviewed. We will review the cytology, pathology and microbiology results with the patient when they become available. Outpatient followup will be with Garner Nash, DO.   Garner Nash, DO [facility] 06/06/2020 12:39 PM

## 2020-06-06 NOTE — Anesthesia Procedure Notes (Signed)
Procedure Name: Intubation Date/Time: 06/06/2020 11:02 AM Performed by: Lance Coon, CRNA Pre-anesthesia Checklist: Patient identified, Emergency Drugs available, Suction available, Patient being monitored and Timeout performed Patient Re-evaluated:Patient Re-evaluated prior to induction Oxygen Delivery Method: Circle system utilized Preoxygenation: Pre-oxygenation with 100% oxygen Induction Type: IV induction Ventilation: Mask ventilation without difficulty Laryngoscope Size: Miller and 3 Grade View: Grade I Tube type: Oral Tube size: 8.5 mm Number of attempts: 1 Airway Equipment and Method: Stylet Placement Confirmation: ETT inserted through vocal cords under direct vision,  positive ETCO2 and breath sounds checked- equal and bilateral Secured at: 23 cm Tube secured with: Tape Dental Injury: Teeth and Oropharynx as per pre-operative assessment

## 2020-06-06 NOTE — Progress Notes (Signed)
No pneumothorax on cxr, ok to go home per dr icard

## 2020-06-06 NOTE — Anesthesia Preprocedure Evaluation (Addendum)
Anesthesia Evaluation  Patient identified by MRN, date of birth, ID band Patient awake    Reviewed: Allergy & Precautions, NPO status , Patient's Chart, lab work & pertinent test results  Airway Mallampati: I  TM Distance: >3 FB Neck ROM: Full    Dental  (+) Edentulous Upper, Missing, Dental Advisory Given,    Pulmonary neg pulmonary ROS, former smoker,    Pulmonary exam normal breath sounds clear to auscultation       Cardiovascular hypertension, Pt. on medications Normal cardiovascular exam Rhythm:Regular Rate:Normal  HLD   Neuro/Psych negative neurological ROS  negative psych ROS   GI/Hepatic negative GI ROS, Neg liver ROS,   Endo/Other  negative endocrine ROS  Renal/GU negative Renal ROS  negative genitourinary   Musculoskeletal  (+) Arthritis ,   Abdominal   Peds  Hematology negative hematology ROS (+)   Anesthesia Other Findings Lung mass  Reproductive/Obstetrics                            Anesthesia Physical Anesthesia Plan  ASA: II  Anesthesia Plan: General   Post-op Pain Management:    Induction: Intravenous  PONV Risk Score and Plan: 2 and Dexamethasone and Ondansetron  Airway Management Planned: Oral ETT  Additional Equipment:   Intra-op Plan:   Post-operative Plan: Extubation in OR  Informed Consent: I have reviewed the patients History and Physical, chart, labs and discussed the procedure including the risks, benefits and alternatives for the proposed anesthesia with the patient or authorized representative who has indicated his/her understanding and acceptance.     Dental advisory given  Plan Discussed with: CRNA  Anesthesia Plan Comments:         Anesthesia Quick Evaluation

## 2020-06-06 NOTE — Interval H&P Note (Signed)
History and Physical Interval Note:  06/06/2020 9:15 AM  Jason [name].  has presented today for surgery, with the diagnosis of lung mass.  The various methods of treatment have been discussed with the patient and family. After consideration of risks, benefits and other options for treatment, the patient has consented to  Procedure(s): VIDEO BRONCHOSCOPY WITH ENDOBRONCHIAL NAVIGATION (Right) VIDEO BRONCHOSCOPY WITH ENDOBRONCHIAL ULTRASOUND (Bilateral) as a surgical intervention.  The patient's history has been reviewed, patient examined, no change in status, stable for surgery.  I have reviewed the patient's chart and labs.  Questions were answered to the patient's satisfaction.     [name]

## 2020-06-07 ENCOUNTER — Telehealth: Payer: Self-pay | Admitting: *Deleted

## 2020-06-07 NOTE — Telephone Encounter (Signed)
Bronch performed on 06/06/2020. Will close encounter.

## 2020-06-07 NOTE — Anesthesia Postprocedure Evaluation (Signed)
Anesthesia Post Note  Patient: Jason [name].  Procedure(s) Performed: VIDEO BRONCHOSCOPY WITH ENDOBRONCHIAL NAVIGATION (Right ) VIDEO BRONCHOSCOPY WITH ENDOBRONCHIAL ULTRASOUND (Bilateral ) BRONCHIAL BRUSHINGS BRONCHIAL NEEDLE ASPIRATION BIOPSIES BRONCHIAL BIOPSIES BRONCHIAL WASHINGS     Patient location during evaluation: PACU Anesthesia Type: General Level of consciousness: awake and alert Pain management: pain level controlled Vital Signs Assessment: post-procedure vital signs reviewed and stable Respiratory status: spontaneous breathing, nonlabored ventilation, respiratory function stable and patient connected to nasal cannula oxygen Cardiovascular status: blood pressure returned to baseline and stable Postop Assessment: no apparent nausea or vomiting Anesthetic complications: no   No complications documented.  Last Vitals:  Vitals:   06/06/20 1250 06/06/20 1300  BP: 124/77 118/83  Pulse: 75 73  Resp: (!) 24 (!) 21  Temp:    SpO2: 95% 94%    Last Pain:  Vitals:   06/06/20 1300  PainSc: 0-No pain                 Danniella Robben L Tayvion Lauder

## 2020-06-07 NOTE — Telephone Encounter (Signed)
I received referral on Jason [name] yesterday. I called him to schedule with Dr. Julien Nordmann. I was unable to reach but did leave vm message with my name and phone number to call.

## 2020-06-08 ENCOUNTER — Other Ambulatory Visit: Payer: Self-pay

## 2020-06-08 ENCOUNTER — Ambulatory Visit: Payer: Medicare Other

## 2020-06-08 DIAGNOSIS — R911 Solitary pulmonary nodule: Secondary | ICD-10-CM

## 2020-06-08 LAB — CULTURE, BAL-QUANTITATIVE W GRAM STAIN: Culture: NO GROWTH

## 2020-06-08 LAB — PULMONARY FUNCTION TEST
DL/VA % pred: 68 %
DL/VA: 2.79 ml/min/mmHg/L
DLCO cor % pred: 55 %
DLCO cor: 13.19 ml/min/mmHg
DLCO unc % pred: 55 %
DLCO unc: 13.19 ml/min/mmHg
FEF 25-75 Post: 2.63 L/sec
FEF 25-75 Pre: 2.46 L/sec
FEF2575-%Change-Post: 6 %
FEF2575-%Pred-Post: 119 %
FEF2575-%Pred-Pre: 111 %
FEV1-%Change-Post: 0 %
FEV1-%Pred-Post: 96 %
FEV1-%Pred-Pre: 95 %
FEV1-Post: 2.46 L
FEV1-Pre: 2.44 L
FEV1FVC-%Change-Post: 2 %
FEV1FVC-%Pred-Pre: 106 %
FEV6-%Change-Post: -2 %
FEV6-%Pred-Post: 90 %
FEV6-%Pred-Pre: 92 %
FEV6-Post: 2.92 L
FEV6-Pre: 2.98 L
FEV6FVC-%Change-Post: 0 %
FEV6FVC-%Pred-Post: 104 %
FEV6FVC-%Pred-Pre: 105 %
FVC-%Change-Post: -1 %
FVC-%Pred-Post: 86 %
FVC-%Pred-Pre: 87 %
FVC-Post: 2.93 L
FVC-Pre: 2.98 L
Post FEV1/FVC ratio: 84 %
Post FEV6/FVC ratio: 100 %
Pre FEV1/FVC ratio: 82 %
Pre FEV6/FVC Ratio: 100 %
RV % pred: 89 %
RV: 2.04 L
TLC % pred: 79 %
TLC: 5.07 L

## 2020-06-08 LAB — CYTOLOGY - NON PAP

## 2020-06-08 LAB — ACID FAST SMEAR (AFB, MYCOBACTERIA): Acid Fast Smear: NEGATIVE

## 2020-06-09 ENCOUNTER — Telehealth: Payer: Self-pay | Admitting: *Deleted

## 2020-06-09 ENCOUNTER — Encounter: Payer: Self-pay | Admitting: *Deleted

## 2020-06-09 NOTE — Telephone Encounter (Signed)
I called patient to schedule him to be seen with Dr. Julien Nordmann next week. I was unable to reach him but did leave a vm message with my name and phone number to call.

## 2020-06-09 NOTE — Progress Notes (Signed)
I spoke to [employer] regarding Mr. Hosang's case.  She would like a PSA to be evaluated on lung bx. I called pathology to request this to be completed. I also received a message from Dr. Tammi Klippel.  He would like patient to see Dr. Julien Nordmann. I did call Mr. Bralley but unable to reach to schedule. I did leave vm message with my name and phone number to call.

## 2020-06-09 NOTE — Progress Notes (Signed)
Mandi, I tried calling the patient. No answer. Looks like he getting setup with onc already.   Thanks,  BLI  Garner Nash, DO [facility] 06/09/2020 12:41 PM

## 2020-06-11 LAB — ANAEROBIC CULTURE W GRAM STAIN

## 2020-06-12 ENCOUNTER — Encounter: Payer: Self-pay | Admitting: Medical Oncology

## 2020-06-12 ENCOUNTER — Encounter: Payer: Self-pay | Admitting: *Deleted

## 2020-06-12 ENCOUNTER — Telehealth: Payer: Self-pay | Admitting: *Deleted

## 2020-06-12 NOTE — Telephone Encounter (Signed)
I called Jason [name] again today to try and schedule him to be seen with Dr. Julien Nordmann. I was unable to reach but left vm message for him to call me.

## 2020-06-12 NOTE — Progress Notes (Signed)
I spoke to Dr. Melina Copa.  She will run PSA on recent lung bx.  She states it should be completed by tomorrow.

## 2020-06-12 NOTE — Progress Notes (Signed)
Left message requesting a return call to discuss referral to the [facility] on 06/20/20.

## 2020-06-13 ENCOUNTER — Encounter: Payer: Self-pay | Admitting: *Deleted

## 2020-06-13 ENCOUNTER — Telehealth: Payer: Self-pay | Admitting: *Deleted

## 2020-06-13 DIAGNOSIS — R918 Other nonspecific abnormal finding of lung field: Secondary | ICD-10-CM

## 2020-06-13 LAB — CYTOLOGY - NON PAP

## 2020-06-13 NOTE — Telephone Encounter (Signed)
I called Mr. Jason [name] again today. I called all phone numbers in EMR.  I was able to leave vm message with my name and phone number.

## 2020-06-13 NOTE — Progress Notes (Signed)
Labs for clinic ordered

## 2020-06-13 NOTE — Telephone Encounter (Signed)
I called Mr. Antuna to schedule with Dr. Julien Nordmann. I was unable to reach again but did leave a vm message with my name and phone number

## 2020-06-13 NOTE — Progress Notes (Signed)
Results have been called to the patient. He had a biopsy 06/06/2020, + diagnosis of squamous  cell carcinoma. Oncology is scheduling for treatment.

## 2020-06-14 ENCOUNTER — Encounter: Payer: Self-pay | Admitting: *Deleted

## 2020-06-14 DIAGNOSIS — R918 Other nonspecific abnormal finding of lung field: Secondary | ICD-10-CM

## 2020-06-15 ENCOUNTER — Encounter: Payer: Self-pay | Admitting: Medical Oncology

## 2020-06-15 ENCOUNTER — Other Ambulatory Visit: Payer: Self-pay | Admitting: *Deleted

## 2020-06-15 ENCOUNTER — Encounter: Payer: Self-pay | Admitting: *Deleted

## 2020-06-15 ENCOUNTER — Other Ambulatory Visit: Payer: Self-pay

## 2020-06-15 ENCOUNTER — Inpatient Hospital Stay: Payer: Medicare Other

## 2020-06-15 ENCOUNTER — Inpatient Hospital Stay: Payer: Medicare Other | Attending: Internal Medicine | Admitting: Internal Medicine

## 2020-06-15 ENCOUNTER — Other Ambulatory Visit: Payer: Self-pay | Admitting: Physician Assistant

## 2020-06-15 ENCOUNTER — Encounter: Payer: Self-pay | Admitting: Internal Medicine

## 2020-06-15 ENCOUNTER — Ambulatory Visit
Admission: RE | Admit: 2020-06-15 | Discharge: 2020-06-15 | Disposition: A | Discharge status: Home / Self Care | Payer: Medicare Other | Source: Ambulatory Visit | Attending: Radiation Oncology | Admitting: Radiation Oncology

## 2020-06-15 DIAGNOSIS — R918 Other nonspecific abnormal finding of lung field: Secondary | ICD-10-CM

## 2020-06-15 DIAGNOSIS — Z8249 Family history of ischemic heart disease and other diseases of the circulatory system: Secondary | ICD-10-CM

## 2020-06-15 DIAGNOSIS — Z79899 Other long term (current) drug therapy: Secondary | ICD-10-CM

## 2020-06-15 DIAGNOSIS — Z5111 Encounter for antineoplastic chemotherapy: Secondary | ICD-10-CM

## 2020-06-15 DIAGNOSIS — Z8546 Personal history of malignant neoplasm of prostate: Secondary | ICD-10-CM

## 2020-06-15 DIAGNOSIS — C3491 Malignant neoplasm of unspecified part of right bronchus or lung: Secondary | ICD-10-CM

## 2020-06-15 DIAGNOSIS — C3411 Malignant neoplasm of upper lobe, right bronchus or lung: Secondary | ICD-10-CM | POA: Diagnosis not present

## 2020-06-15 DIAGNOSIS — C3490 Malignant neoplasm of unspecified part of unspecified bronchus or lung: Secondary | ICD-10-CM

## 2020-06-15 DIAGNOSIS — R590 Localized enlarged lymph nodes: Secondary | ICD-10-CM

## 2020-06-15 DIAGNOSIS — C61 Malignant neoplasm of prostate: Secondary | ICD-10-CM

## 2020-06-15 DIAGNOSIS — Z87891 Personal history of nicotine dependence: Secondary | ICD-10-CM

## 2020-06-15 LAB — CBC WITH DIFFERENTIAL (CANCER CENTER ONLY)
Abs Immature Granulocytes: 0.01 10*3/uL (ref 0.00–0.07)
Basophils Absolute: 0 10*3/uL (ref 0.0–0.1)
Basophils Relative: 1 %
Eosinophils Absolute: 0.3 10*3/uL (ref 0.0–0.5)
Eosinophils Relative: 5 %
HCT: 35.9 % — ABNORMAL LOW (ref 39.0–52.0)
Hemoglobin: 11.8 g/dL — ABNORMAL LOW (ref 13.0–17.0)
Immature Granulocytes: 0 %
Lymphocytes Relative: 42 %
Lymphs Abs: 2.2 10*3/uL (ref 0.7–4.0)
MCH: 28.9 pg (ref 26.0–34.0)
MCHC: 32.9 g/dL (ref 30.0–36.0)
MCV: 88 fL (ref 80.0–100.0)
Monocytes Absolute: 0.6 10*3/uL (ref 0.1–1.0)
Monocytes Relative: 12 %
Neutro Abs: 2.1 10*3/uL (ref 1.7–7.7)
Neutrophils Relative %: 40 %
Platelet Count: 259 10*3/uL (ref 150–400)
RBC: 4.08 MIL/uL — ABNORMAL LOW (ref 4.22–5.81)
RDW: 15.2 % (ref 11.5–15.5)
WBC Count: 5.3 10*3/uL (ref 4.0–10.5)
nRBC: 0 % (ref 0.0–0.2)

## 2020-06-15 LAB — CMP (CANCER CENTER ONLY)
ALT: 13 U/L (ref 0–44)
AST: 15 U/L (ref 15–41)
Albumin: 3.5 g/dL (ref 3.5–5.0)
Alkaline Phosphatase: 94 U/L (ref 38–126)
Anion gap: 9 (ref 5–15)
BUN: 32 mg/dL — ABNORMAL HIGH (ref 8–23)
CO2: 23 mmol/L (ref 22–32)
Calcium: 8.9 mg/dL (ref 8.9–10.3)
Chloride: 108 mmol/L (ref 98–111)
Creatinine: 1.88 mg/dL — ABNORMAL HIGH (ref 0.61–1.24)
GFR, Estimated: 38 mL/min — ABNORMAL LOW (ref >60)
Glucose, Bld: 106 mg/dL — ABNORMAL HIGH (ref 70–99)
Potassium: 4.4 mmol/L (ref 3.5–5.1)
Sodium: 140 mmol/L (ref 135–145)
Total Bilirubin: 0.5 mg/dL (ref 0.3–1.2)
Total Protein: 7 g/dL (ref 6.5–8.1)

## 2020-06-15 MED ORDER — PROCHLORPERAZINE MALEATE 10 MG PO TABS: 10.0000 mg | ORAL_TABLET | Freq: Four times a day (QID) | ORAL | 0 refills | Status: DC | PRN

## 2020-06-15 NOTE — Progress Notes (Signed)
The proposed treatment discussed in cancer conference is for discussion purpose only and is not a binding recommendation.  The patient have not been physically examined nor present for their treatment options.  Therefore, final treatment plans can not be decided.

## 2020-06-15 NOTE — Progress Notes (Signed)
START ON PATHWAY REGIMEN - Non-Small Cell Lung     Administer weekly:     Paclitaxel      Carboplatin   **Always confirm dose/schedule in your pharmacy ordering system**  Patient Characteristics: Preoperative or Nonsurgical Candidate (Clinical Staging), Stage III - Nonsurgical Candidate (Nonsquamous and Squamous), PS = 0, 1 Therapeutic Status: Preoperative or Nonsurgical Candidate (Clinical Staging) AJCC T Category: cT1c AJCC N Category: cN2 AJCC M Category: cM0 AJCC 8 Stage Grouping: IIIA ECOG Performance Status: 1 Intent of Therapy: Curative Intent, Discussed with Patient

## 2020-06-15 NOTE — Progress Notes (Signed)
I called pt to introduce myself as the Prostate Nurse Navigator and the Coordinator of the Prostate [place].  1. I confirmed with the patient he is aware of his referral to the clinic 5/24, arriving @ 12:30 pm..   2. I discussed the format of the clinic and the physicians he will be seeing that day.  3. I discussed where the clinic is located and how to contact me.  4. I confirmed his address and informed him I would be mailing a packet of information and forms to be completed. I asked him to bring them with him the day of his appointment.   He voiced understanding of the above. I asked him to call me if he has any questions or concerns regarding his appointments or the forms he needs to complete.

## 2020-06-15 NOTE — Progress Notes (Signed)
[place] [phone]   [phone]  CONSULT NOTE  REFERRING PHYSICIAN: Dr. Leory Plowman Icard  REASON FOR CONSULTATION:  70 years old African-American male recently diagnosed with lung cancer.  HPI Jason [name]. is a 70 y.o. male with past medical history significant for hypertension, osteoarthritis and recent diagnosis with prostate cancer with Gleason score of 9.  The patient mentioned that he was seen by his primary care physician Dr. Dorthy [name] and wound he was found to have elevated PSA.  Because of his long history of smoking he had CT screening of the chest on 05/22/2020 and that showed a spiculated 2.17 cm apical segment right upper lobe nodule highly worrisome for adenocarcinoma.  This was followed by CT super D of the chest on 06/02/2020 and it showed stable spiculated partially cavitary right upper lobe pulmonary nodule unchanged since the previous exam concerning for primary bronchogenic neoplasm.  There was associated mildly enlarged mediastinal lymph node.  A PET scan on 06/02/2020 showed slightly greater than 2.0 cm right upper lobe pulmonary nodule with maximum SUV of 19.7.  There was enlarged right paratracheal lymph node measuring 1.2 cm with SUV of 8.7 and approximately 1.4 cm AP window lymph node with maximum SUV of 3.1.  There was no additional these are enlarged or hypermetabolic lymph nodes in the chest and no evidence of metastatic disease for hypermetabolic focus in the right prostate gland with maximum SUV of 5.6 in this patient with known prostate cancer. The patient was seen by Dr. Valeta Harms and he underwent bronchoscopy with endobronchial ultrasound and electromagnetic navigational bronchoscopy on 06/06/2020. The final pathology ([id number]) showed malignant cells present. By immunohistochemistry, the neoplastic cells are positive for cytokeratin 5/6 and p40 but negative for TTF-1. Based on the cytology and immunohistochemical profile, this  is most consistent with squamous cell carcinoma.  Previous prostate biopsy on 05/22/2020 was positive for prostate adenocarcinoma with Gleason score of 9 (4+5). The patient was referred to the multidisciplinary thoracic oncology clinic today for evaluation and recommendation regarding treatment of his condition.  When seen today he is feeling fine with no concerning complaints.  He denied having any chest pain, shortness of breath, cough or hemoptysis.  He denied having any fever or chills.  He has no nausea, vomiting, diarrhea or constipation.  He has no headache or visual changes. Family history significant for father died at young age from motor vehicle accident.  Mother had congestive heart failure and diabetes.  Brother had lung cancer. The patient is married and has 3 living children and 1 deceased.  He was accompanied today by his wife Jason [name].  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  He had a history of smoking 1 pack/day for around 53 years and quit 20 years ago.  He also has a history of alcohol but not recently and no history of drug abuse. HPI  Past Medical History:  Diagnosis Date  . Arthritis    back  . Hypertension   . Prostate cancer ([place])   . Tobacco use     Past Surgical History:  Procedure Laterality Date  . BACK SURGERY    . BRONCHIAL BIOPSY  06/06/2020   Procedure: BRONCHIAL BIOPSIES;  Surgeon: Garner Nash, DO;  Location: [facility];  Service: Pulmonary;;  . BRONCHIAL BRUSHINGS  06/06/2020   Procedure: BRONCHIAL BRUSHINGS;  Surgeon: Garner Nash, DO;  Location: [facility];  Service:  Pulmonary;;  . BRONCHIAL NEEDLE ASPIRATION BIOPSY  06/06/2020   Procedure: BRONCHIAL NEEDLE ASPIRATION BIOPSIES;  Surgeon: Garner Nash, DO;  Location: [facility];  Service: Pulmonary;;  . BRONCHIAL WASHINGS  06/06/2020   Procedure: BRONCHIAL WASHINGS;  Surgeon: Garner Nash, DO;   Location: [facility];  Service: Pulmonary;;  . COLONOSCOPY    . OTHER SURGICAL HISTORY     fluid drawn off his testicle  . PROSTATE BIOPSY    . ROTATOR CUFF REPAIR Right   . VIDEO BRONCHOSCOPY WITH ENDOBRONCHIAL NAVIGATION Right 06/06/2020   Procedure: VIDEO BRONCHOSCOPY WITH ENDOBRONCHIAL NAVIGATION;  Surgeon: Garner Nash, DO;  Location: [facility];  Service: Pulmonary;  Laterality: Right;  Marland Kitchen VIDEO BRONCHOSCOPY WITH ENDOBRONCHIAL ULTRASOUND Bilateral 06/06/2020   Procedure: VIDEO BRONCHOSCOPY WITH ENDOBRONCHIAL ULTRASOUND;  Surgeon: Garner Nash, DO;  Location: [facility];  Service: Pulmonary;  Laterality: Bilateral;    Family History  Problem Relation Age of Onset  . Heart failure Mother     Social History Social History   Tobacco Use  . Smoking status: Former Smoker    Packs/day: 1.50    Years: 35.00    Pack years: 52.50    Types: Cigarettes    Start date: 25    Quit date: 05/26/2020    Years since quitting: 0.0  . Smokeless tobacco: Never Used  Substance Use Topics  . Alcohol use: Yes    Comment: rare  . Drug use: Not Currently    No Known Allergies  Current Outpatient Medications  Medication Sig Dispense Refill  . ketoconazole (NIZORAL) 2 % shampoo     . atorvastatin (LIPITOR) 10 MG tablet Take 10 mg by mouth daily.    Marland Kitchen azelastine (ASTELIN) 0.1 % nasal spray Place 1 spray into both nostrils 2 (two) times daily as needed for rhinitis. Use in each nostril as directed    . fluticasone (FLONASE) 50 MCG/ACT nasal spray Place 2 sprays into both nostrils daily.    Marland Kitchen levocetirizine (XYZAL) 5 MG tablet Take 5 mg by mouth every evening.    . montelukast (SINGULAIR) 10 MG tablet Take 10 mg by mouth at bedtime.    . valsartan-hydrochlorothiazide (DIOVAN-HCT) 320-25 MG tablet Take 1 tablet by mouth daily.    . vitamin B-12 (CYANOCOBALAMIN) 500 MCG tablet Take 500 mcg by mouth daily.     No current facility-administered medications for this visit.    Review  of Systems  Constitutional: negative Eyes: negative Ears, nose, mouth, throat, and face: negative Respiratory: negative Cardiovascular: negative Gastrointestinal: negative Genitourinary:negative Integument/breast: negative Hematologic/lymphatic: negative Musculoskeletal:negative Neurological: negative Behavioral/Psych: negative Endocrine: negative Allergic/Immunologic: negative  Physical Exam  CXK:GYJEH, healthy, no distress, well nourished and well developed SKIN: skin color, texture, turgor are normal, no rashes or significant lesions HEAD: Normocephalic, No masses, lesions, tenderness or abnormalities EYES: normal, PERRLA, Conjunctiva are pink and non-injected EARS: External ears normal, Canals clear OROPHARYNX:no exudate, no erythema and lips, buccal mucosa, and tongue normal  NECK: supple, no adenopathy, no JVD LYMPH:  no palpable lymphadenopathy, no hepatosplenomegaly BREAST:not examined LUNGS: clear to auscultation , and palpation HEART: regular rate & rhythm, no murmurs and no gallops ABDOMEN:abdomen soft, non-tender, normal bowel sounds and no masses or organomegaly BACK: No CVA tenderness, Range of motion is normal EXTREMITIES:no joint deformities, effusion, or inflammation, no edema  NEURO: alert & oriented x 3 with fluent speech, no focal motor/sensory deficits  PERFORMANCE STATUS: ECOG 1  LABORATORY DATA: Lab Results  Component Value Date   WBC  5.3 06/15/2020   HGB 11.8 (L) 06/15/2020   HCT 35.9 (L) 06/15/2020   MCV 88.0 06/15/2020   PLT 259 06/15/2020      Chemistry      Component Value Date/Time   NA 135 06/06/2020 1021   K 4.2 06/06/2020 1021   CL 108 06/06/2020 1021   CO2 22 06/06/2020 1021   BUN 25 (H) 06/06/2020 1021   CREATININE 1.87 (H) 06/06/2020 1021      Component Value Date/Time   CALCIUM 8.9 06/06/2020 1021       RADIOGRAPHIC STUDIES: NM PET Image Initial (PI) Skull Base To Thigh  Result Date: 06/02/2020 CLINICAL DATA:   Initial treatment strategy for pulmonary nodule, patient also with reported history of prostate cancer. EXAM: NUCLEAR MEDICINE PET SKULL BASE TO THIGH TECHNIQUE: 9.63 mCi F-18 FDG was injected intravenously. Full-ring PET imaging was performed from the skull base to thigh after the radiotracer. CT data was obtained and used for attenuation correction and anatomic localization. Fasting blood glucose: 90 mg/dl COMPARISON:  Prior chest imaging from today and also from May 22, 2020. FINDINGS: Mediastinal blood pool activity: SUV max 2.43 Liver activity: SUV max NA NECK: No hypermetabolic lymph nodes in the neck. Incidental CT findings: none CHEST: Slightly greater than 2 cm RIGHT upper lobe pulmonary nodule displays maximum SUV of 19.7. Enlarged RIGHT paratracheal lymph node (image 68/4) 12 mm with a maximum SUV of 8.7. Approximately 14 mm AP window lymph node with a maximum SUV of 3.1. No additional either enlarged or hypermetabolic lymph nodes in the chest. Signs of suspected interstitial lung disease with patchy areas of increased metabolic activity. Incidental CT findings: none ABDOMEN/PELVIS: No abnormal hypermetabolic activity within the liver, pancreas, adrenal glands, or spleen. No hypermetabolic lymph nodes in the abdomen or pelvis. Incidental CT findings: Benign appearing thickening of the LEFT adrenal gland low-density compatible with benign process and without hypermetabolic changes. No acute hepatic, biliary or pancreatic findings by noncontrast imaging. Spleen, adrenal glands, kidneys and urinary bladder otherwise unremarkable. Signs of colonic diverticulosis. Normal appendix. No acute gastrointestinal findings. Hypermetabolic focus in the RIGHT prostate gland with a maximum SUV of 5.6 in this patient with known prostate cancer. This is in the RIGHT mid prostate (image 177 measuring approximately 1.1 cm. No signs of pelvic adenopathy hypermetabolic by FDG PET. Small hydrocele on the RIGHT. SKELETON: No  focal hypermetabolic activity to suggest skeletal metastasis. Incidental CT findings: Post multilevel ACDF in the cervical spine, spinal degenerative changes. IMPRESSION: 1. Spiculated RIGHT upper lobe nodule with cavitation, based on morphologic features and location favors bronchogenic neoplasm with associated RIGHT paratracheal nodal involvement. 2. Enlarged AP window lymph node shows only mild FDG uptake, only slightly above mediastinal blood pool. Findings are equivocal, suspicious based mainly on size but potentially reactive in the setting of interstitial lung disease. 3. Signs of prostate cancer with hypermetabolic focus in the prostate bed in the mid gland on the RIGHT. Correlation with biopsy results is suggested. 4. No suspicious bone lesions or pelvic nodal disease. 5. Aortic atherosclerosis. 6. Pulmonary emphysema. 7. Signs of interstitial lung disease. Aortic Atherosclerosis (ICD10-I70.0) and Emphysema (ICD10-J43.9). Electronically Signed   By: Zetta Bills M.D.   On: 06/02/2020 16:16   DG CHEST PORT 1 VIEW  Result Date: 06/06/2020 CLINICAL DATA:  Status post bronchoscopy.  Pulmonary nodule. EXAM: PORTABLE CHEST 1 VIEW COMPARISON:  Single-view of the chest 10/25/2013. CT chest 06/02/2020. FINDINGS: No pneumothorax after bronchoscopy. Right upper lobe pulmonary nodule is identified. There  is some coarsening of the pulmonary interstitium. No consolidative process. Heart size is normal. No acute or focal bony abnormality. IMPRESSION: Negative for pneumothorax after biopsy of a right upper lobe pulmonary nodule. No acute finding. Electronically Signed   By: Inge Rise M.D.   On: 06/06/2020 14:03   CT CHEST LUNG CA SCREEN LOW DOSE W/O CM  Result Date: 05/22/2020 CLINICAL DATA:  Current smoker, 22 pack-year history. EXAM: CT CHEST WITHOUT CONTRAST LOW-DOSE FOR LUNG CANCER SCREENING TECHNIQUE: Multidetector CT imaging of the chest was performed following the standard protocol without IV  contrast. COMPARISON:  None. FINDINGS: Cardiovascular: Atherosclerotic calcification of the aorta. Heart is at the upper limits of normal in size. No pericardial effusion. Mediastinum/Nodes: Low right paratracheal lymph node measures 11 mm. AP window lymph node measures 15 mm. Hilar regions are difficult to evaluate without IV contrast. No axillary adenopathy. Esophagus is grossly unremarkable. Lungs/Pleura: Centrilobular and paraseptal emphysema. Spiculated and slightly cavitary nodule in the apical segment right upper lobe measures 21.7 mm (4/41). There are extensions to the adjacent visceral pleural. Other pulmonary nodules measure 4.4 mm or less in size. Calcified left lower lobe granuloma. Basilar predominant coarsened pulmonary parenchymal ground-glass, bronchiectasis and mucoid impaction with areas of peripheral and basilar sparing. No pleural fluid. Airway is unremarkable. Upper Abdomen: Visualized portions of the liver, gallbladder and right adrenal gland are unremarkable. Nodular thickening of the lateral limb left adrenal gland. Visualized portions of the kidneys, spleen, pancreas, stomach and bowel are otherwise unremarkable. Musculoskeletal: Mild degenerative changes in the spine. No worrisome lytic or sclerotic lesions. IMPRESSION: 1. Spiculated 21.7 mm apical segment right upper lobe nodule, highly worrisome for adenocarcinoma. Lung-RADS 4X, highly suspicious. Additional imaging evaluation or consultation with Pulmonology or Thoracic Surgery recommended. These results will be called to the ordering clinician or representative by the Radiologist Assistant, and communication documented in the PACS or [employer]. 2. Enlarged low right paratracheal and AP window lymph nodes, worrisome for metastatic disease. Enlarged mediastinal lymph nodes can also be seen in the setting of interstitial lung disease. 3. Lower lung zone predominant coarsened pulmonary parenchymal ground-glass and bronchiectasis,  findings which can be seen with interstitial lung disease such as chronic hypersensitivity pneumonitis or nonspecific interstitial pneumonitis. 4.  Aortic atherosclerosis (ICD10-I70.0). 5.  Emphysema (ICD10-J43.9). Electronically Signed   By: Lorin Picket M.D.   On: 05/22/2020 15:53   CT Super D Chest Wo Contrast  Result Date: 06/02/2020 CLINICAL DATA:  History of lung nodules in a 70 year old male. EXAM: CT CHEST WITHOUT CONTRAST TECHNIQUE: Multidetector CT imaging of the chest was performed using thin slice collimation for electromagnetic bronchoscopy planning purposes, without intravenous contrast. COMPARISON:  May 22, 2020 FINDINGS: Cardiovascular: Normal caliber of the thoracic aorta. Scattered aortic atherosclerosis. Heart size normal without pericardial effusion. Limited assessment of cardiovascular structures given lack of intravenous contrast. Mediastinum/Nodes: Thoracic inlet structures are normal. No axillary lymphadenopathy. Scattered mediastinal lymph nodes show no change, see PET evaluation of the same date for further detail. AP window lymph node is the largest lymph node in the chest measuring approximately 14 mm short axis within 1 mm of previous measurement. No subcarinal adenopathy. Mildly enlarged RIGHT paratracheal lymph nodes. No gross hilar lymphadenopathy. Lungs/Pleura: Stable spiculated partially cavitary RIGHT upper lobe pulmonary nodule unchanged since recent imaging measuring approximately 2.2 x 2.0 cm. Bandlike changes extend to the periphery of the RIGHT upper lobe. Signs of pulmonary emphysema. Ground-glass and septal thickening with signs of bronchiectasis with basilar predominance. No signs of  honeycombing. Paraseptal emphysema at the lung apices. No effusion. No consolidation. Upper Abdomen: Incidental imaging of upper abdominal contents without acute process. Musculoskeletal: Signs of cervical and upper thoracic anterior spinal fusion and discectomy with similar appearance,  incompletely imaged. IMPRESSION: 1. Stable spiculated partially cavitary RIGHT upper lobe pulmonary nodule unchanged since recent imaging. Findings remain concerning for primary bronchogenic neoplasm. Referral to multi disciplinary thoracic oncologic setting is suggested if not yet performed. Findings remain associated with mildly enlarged mediastinal lymph nodes. Refer to separate PET interpretation for further detail. 2. Signs of pulmonary emphysema and interstitial lung disease. 3. Emphysema and aortic atherosclerosis. Aortic Atherosclerosis (ICD10-I70.0) and Emphysema (ICD10-J43.9). Electronically Signed   By: Zetta Bills M.D.   On: 06/02/2020 15:52   DG C-ARM BRONCHOSCOPY  Result Date: 06/06/2020 C-ARM BRONCHOSCOPY: Fluoroscopy was utilized by the requesting physician.  No radiographic interpretation.    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  The patient was also recently diagnosed with prostate adenocarcinoma with Gleason score of 06 May 2020.   PLAN: I had a lengthy discussion with the patient and his wife today about his current disease stage, prognosis and treatment options. I personally and independently reviewed the scan images and discussed the results and showed the images to the patient and his wife. I recommended for the patient to complete the staging work-up by ordering MRI of the brain to rule out brain metastasis. For the stage III lung cancer, I recommended for the patient a course of concurrent chemoradiation with weekly carboplatin for AUC of 2 and paclitaxel 45 Mg/M2 for 6-7 weeks followed by consolidation treatment with immunotherapy if the patient has no evidence for disease progression after  the induction phase. I discussed with the patient the adverse effect of this treatment including but not limited to alopecia, myelosuppression, nausea and vomiting, peripheral neuropathy, liver or renal dysfunction.  He is expected to start the first cycle of this treatment on Jun 27, 2020. He will come back for follow-up visit 1 week after the treatment. He will have a chemotherapy education class before the first dose of treatment. I will call his pharmacy with prescription for Compazine 10 mg p.o. every 6 hours as needed for nausea. For the recently diagnosis of prostate carcinoma, the patient will be treated with curative radiotherapy in addition to hormonal therapy after the induction phase of his concurrent chemoradiation for the lung cancer. The patient and his wife agreed to the current plan. He advised to call immediately if he has any concerning symptoms in the interval.  The patient voices understanding of current disease status and treatment options and is in agreement with the current care plan.  All questions were answered. The patient knows to call the clinic with any problems, questions or concerns. We can certainly see the patient much sooner if necessary.  Thank you so much for allowing me to participate in the care of Jason [name].. I will continue to follow up the patient with you and assist in his care.  The total time spent in the appointment was 80 minutes.  Disclaimer: This note was dictated with voice recognition software. Similar sounding words can inadvertently be transcribed and may not be corrected upon review.   Eilleen Kempf Jun 15, 2020, 2:38 PM

## 2020-06-15 NOTE — Progress Notes (Signed)
Radiation Oncology         [phone] ________________________________ Multidisciplinary Thoracic Oncology Clinic [facility]) Initial Outpatient Consultation  Name: Jason [name]. 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 Malignant neoplasm of prostate ([place])  C61   2. Non-small cell lung cancer, right (HCC)  C34.91     HISTORY OF PRESENT ILLNESS: Jason [name]. 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 Valeta Harms today in the management of his lung cancer.  He is a current smoker and was referred for LDCT chest scan on 05/22/20 for lung cancer screening. This study showed a 2.17 cm spiculated mass in the right upper lobe lung as well as an enlarged low right paratracheal and AP node, suspicious for primary bronchogenic carcinoma. A PET scan was performed on 06/02/20 for further evaluation and confirmed the spiculated RIGHT upper lobe nodule with cavitation to be hypermetabolic and based on morphologic features and location, was felt to favor bronchogenic neoplasm with associated RIGHT paratracheal nodal involvement. The enlarged AP window lymph node showed only mild FDG uptake, only slightly above mediastinal blood pool, felt to be equivocal but suspicious, based mainly on size although could potentially be reactive in the setting of interstitial lung disease.    He underwent bronchoscopy with EBUS for biopsy on 06/06/20 under the care of Dr. Valeta Harms and final pathology was positive for squamous cell carcinoma on FNA from the RUL lesion but negative node sample.  The patient was referred today for presentation in the multidisciplinary  thoracic oncology conference. Radiology studies and pathology slides were presented there for review and discussion of treatment options. A consensus was discussed regarding potential next steps and he is here to review results and recommendations.  Of note, he has also recently been diagnosed with high risk prostate cancer and will be seen in our multidisciplinary prostate cancer conference to discuss treatment options on 06/20/20.  PREVIOUS RADIATION THERAPY: No  PAST MEDICAL HISTORY:  Past Medical History:  Diagnosis Date  . Arthritis    back  . Hypertension   . Prostate cancer ([place])   . Tobacco use       PAST SURGICAL HISTORY: Past Surgical History:  Procedure Laterality Date  . BACK SURGERY    . BRONCHIAL BIOPSY  06/06/2020   Procedure: BRONCHIAL BIOPSIES;  Surgeon: Garner Nash, DO;  Location: [place] [facility];  Service: Pulmonary;;  . BRONCHIAL BRUSHINGS  06/06/2020   Procedure: BRONCHIAL BRUSHINGS;  Surgeon: Garner Nash, DO;  Location: [place] [facility];  Service: Pulmonary;;  . BRONCHIAL NEEDLE ASPIRATION BIOPSY  06/06/2020   Procedure: BRONCHIAL NEEDLE ASPIRATION BIOPSIES;  Surgeon: Garner Nash, DO;  Location: [place] [facility];  Service: Pulmonary;;  . BRONCHIAL WASHINGS  06/06/2020   Procedure: BRONCHIAL WASHINGS;  Surgeon: Garner Nash, DO;  Location: [place] [facility];  Service: Pulmonary;;  . COLONOSCOPY    . OTHER SURGICAL HISTORY     fluid drawn off his testicle  . PROSTATE BIOPSY    . ROTATOR CUFF REPAIR Right   . VIDEO BRONCHOSCOPY WITH ENDOBRONCHIAL NAVIGATION Right 06/06/2020   Procedure: VIDEO BRONCHOSCOPY WITH ENDOBRONCHIAL NAVIGATION;  Surgeon: Garner Nash, DO;  Location: [place] [facility];  Service: Pulmonary;  Laterality: Right;  Marland Kitchen VIDEO BRONCHOSCOPY WITH ENDOBRONCHIAL ULTRASOUND Bilateral 06/06/2020   Procedure: VIDEO BRONCHOSCOPY WITH ENDOBRONCHIAL ULTRASOUND;  Surgeon: Garner Nash, DO;  Location: [place];  Service: Pulmonary;  Laterality: Bilateral;     FAMILY HISTORY:  Family History  Problem Relation Age of Onset  . Heart failure Mother     SOCIAL HISTORY:  Social History   Socioeconomic History  . Marital status: Married    Spouse name: Not on file  . Number of children: Not on file  . Years of education: Not on file  . Highest education level: Not on file  Occupational History  . Not on file  Tobacco Use  . Smoking status: Former Smoker    Packs/day: 1.50    Years: 35.00    Pack years: 52.50    Types: Cigarettes    Start date: 80    Quit date: 05/26/2020    Years since quitting: 0.0  . Smokeless tobacco: Never Used  Substance and Sexual Activity  . Alcohol use: Yes    Comment: rare  . Drug use: Not Currently  . Sexual activity: Not on file  Other Topics Concern  . Not on file  Social History Narrative  . Not on file   Social Determinants of Health   Financial Resource Strain: Not on file  Food Insecurity: Not on file  Transportation Needs: Not on file  Physical Activity: Not on file  Stress: Not on file  Social Connections: Not on file  Intimate Partner Violence: Not on file    ALLERGIES: Patient has no known allergies.  MEDICATIONS:  Current Outpatient Medications  Medication Sig Dispense Refill  . acetaminophen (TYLENOL) 325 MG tablet Take 650 mg by mouth every 6 (six) hours as needed.    Marland Kitchen atorvastatin (LIPITOR) 10 MG tablet Take 10 mg by mouth daily.    Marland Kitchen azelastine (ASTELIN) 0.1 % nasal spray Place 1 spray into both nostrils 2 (two) times daily as needed for rhinitis. Use in each nostril as directed    . fluticasone (FLONASE) 50 MCG/ACT nasal spray Place 2 sprays into both nostrils daily.    Marland Kitchen ketoconazole (NIZORAL) 2 % shampoo     . levocetirizine (XYZAL) 5 MG tablet Take 5 mg by mouth every evening.    . montelukast (SINGULAIR) 10 MG tablet Take 10 mg by mouth at bedtime.    . prochlorperazine (COMPAZINE) 10 MG tablet Take 1 tablet (10 mg total) by mouth every 6 (six) hours as needed for  nausea or vomiting. 30 tablet 0  . valsartan-hydrochlorothiazide (DIOVAN-HCT) 320-25 MG tablet Take 1 tablet by mouth daily.    . vitamin B-12 (CYANOCOBALAMIN) 500 MCG tablet Take 500 mcg by mouth daily.     No current facility-administered medications for this encounter.    REVIEW OF SYSTEMS:   A complete review of systems is obtained and is otherwise negative.    PHYSICAL EXAM:  Wt Readings from Last 3 Encounters:  06/15/20 191 lb 14.4 oz (87 kg)  06/06/20 188 lb (85.3 kg)  05/31/20 190 lb (86.2 kg)   Temp Readings from Last 3 Encounters:  06/15/20 (!) 97.3 F (36.3 C) (Tympanic)  06/06/20 98.4 F (36.9 C)  05/31/20 98.1 F (36.7 C) (Temporal)   BP Readings from Last 3 Encounters:  06/15/20 133/77  06/06/20 118/83  05/31/20 116/64   Pulse Readings from Last 3 Encounters:  06/15/20 60  06/06/20  73  05/31/20 68    /10  In general this is a well appearing African American male in no acute distress. He's is alert and oriented x4 and appropriate throughout the examination. HEENT reveals that the patient is normocephalic, atraumatic. EOMs are intact. PERRLA. Skin is intact without any evidence of gross lesions.Cardiopulmonary assessment is negative for acute distress and he exhibits normal effort. The abdomen is soft, non tender, non distended. Lower extremities are negative for pretibial pitting edema, deep calf tenderness, cyanosis or clubbing.   KPS = 100  100 - Normal; no complaints; no evidence of disease. 90   - Able to carry on normal activity; minor signs or symptoms of disease. 80   - Normal activity with effort; some signs or symptoms of disease. 54   - Cares for self; unable to carry on normal activity or to do active work. 60   - Requires occasional assistance, but is able to care for most of his personal needs. 50   - Requires considerable assistance and frequent medical care. 88   - Disabled; requires special care and assistance. 14   - Severely disabled;  hospital admission is indicated although death not imminent. 42   - Very sick; hospital admission necessary; active supportive treatment necessary. 10   - Moribund; fatal processes progressing rapidly. 0     - Dead  Karnofsky DA, Abelmann [place], Craver LS and Burchenal [facility] [phone]) The use of the nitrogen mustards in the palliative treatment of carcinoma: with particular reference to bronchogenic carcinoma Cancer 1 634-56  LABORATORY DATA:  Lab Results  Component Value Date   WBC 5.3 06/15/2020   HGB 11.8 (L) 06/15/2020   HCT 35.9 (L) 06/15/2020   MCV 88.0 06/15/2020   PLT 259 06/15/2020   Lab Results  Component Value Date   NA 140 06/15/2020   K 4.4 06/15/2020   CL 108 06/15/2020   CO2 23 06/15/2020   Lab Results  Component Value Date   ALT 13 06/15/2020   AST 15 06/15/2020   ALKPHOS 94 06/15/2020   BILITOT 0.5 06/15/2020     RADIOGRAPHY: NM PET Image Initial (PI) Skull Base To Thigh  Result Date: 06/02/2020 CLINICAL DATA:  Initial treatment strategy for pulmonary nodule, patient also with reported history of prostate cancer. EXAM: NUCLEAR MEDICINE PET SKULL BASE TO THIGH TECHNIQUE: 9.63 mCi F-18 FDG was injected intravenously. Full-ring PET imaging was performed from the skull base to thigh after the radiotracer. CT data was obtained and used for attenuation correction and anatomic localization. Fasting blood glucose: 90 mg/dl COMPARISON:  Prior chest imaging from today and also from May 22, 2020. FINDINGS: Mediastinal blood pool activity: SUV max 2.43 Liver activity: SUV max NA NECK: No hypermetabolic lymph nodes in the neck. Incidental CT findings: none CHEST: Slightly greater than 2 cm RIGHT upper lobe pulmonary nodule displays maximum SUV of 19.7. Enlarged RIGHT paratracheal lymph node (image 68/4) 12 mm with a maximum SUV of 8.7. Approximately 14 mm AP window lymph node with a maximum SUV of 3.1. No additional either enlarged or hypermetabolic lymph nodes in the chest. Signs of  suspected interstitial lung disease with patchy areas of increased metabolic activity. Incidental CT findings: none ABDOMEN/PELVIS: No abnormal hypermetabolic activity within the liver, pancreas, adrenal glands, or spleen. No hypermetabolic lymph nodes in the abdomen or pelvis. Incidental CT findings: Benign appearing thickening of the LEFT adrenal gland low-density compatible with benign process and without hypermetabolic changes. No acute hepatic, biliary or pancreatic  findings by noncontrast imaging. Spleen, adrenal glands, kidneys and urinary bladder otherwise unremarkable. Signs of colonic diverticulosis. Normal appendix. No acute gastrointestinal findings. Hypermetabolic focus in the RIGHT prostate gland with a maximum SUV of 5.6 in this patient with known prostate cancer. This is in the RIGHT mid prostate (image 177 measuring approximately 1.1 cm. No signs of pelvic adenopathy hypermetabolic by FDG PET. Small hydrocele on the RIGHT. SKELETON: No focal hypermetabolic activity to suggest skeletal metastasis. Incidental CT findings: Post multilevel ACDF in the cervical spine, spinal degenerative changes. IMPRESSION: 1. Spiculated RIGHT upper lobe nodule with cavitation, based on morphologic features and location favors bronchogenic neoplasm with associated RIGHT paratracheal nodal involvement. 2. Enlarged AP window lymph node shows only mild FDG uptake, only slightly above mediastinal blood pool. Findings are equivocal, suspicious based mainly on size but potentially reactive in the setting of interstitial lung disease. 3. Signs of prostate cancer with hypermetabolic focus in the prostate bed in the mid gland on the RIGHT. Correlation with biopsy results is suggested. 4. No suspicious bone lesions or pelvic nodal disease. 5. Aortic atherosclerosis. 6. Pulmonary emphysema. 7. Signs of interstitial lung disease. Aortic Atherosclerosis (ICD10-I70.0) and Emphysema (ICD10-J43.9). Electronically Signed   By: Zetta Bills M.D.   On: 06/02/2020 16:16   DG CHEST PORT 1 VIEW  Result Date: 06/06/2020 CLINICAL DATA:  Status post bronchoscopy.  Pulmonary nodule. EXAM: PORTABLE CHEST 1 VIEW COMPARISON:  Single-view of the chest 10/25/2013. CT chest 06/02/2020. FINDINGS: No pneumothorax after bronchoscopy. Right upper lobe pulmonary nodule is identified. There is some coarsening of the pulmonary interstitium. No consolidative process. Heart size is normal. No acute or focal bony abnormality. IMPRESSION: Negative for pneumothorax after biopsy of a right upper lobe pulmonary nodule. No acute finding. Electronically Signed   By: Inge Rise M.D.   On: 06/06/2020 14:03   CT CHEST LUNG CA SCREEN LOW DOSE W/O CM  Result Date: 05/22/2020 CLINICAL DATA:  Current smoker, 22 pack-year history. EXAM: CT CHEST WITHOUT CONTRAST LOW-DOSE FOR LUNG CANCER SCREENING TECHNIQUE: Multidetector CT imaging of the chest was performed following the standard protocol without IV contrast. COMPARISON:  None. FINDINGS: Cardiovascular: Atherosclerotic calcification of the aorta. Heart is at the upper limits of normal in size. No pericardial effusion. Mediastinum/Nodes: Low right paratracheal lymph node measures 11 mm. AP window lymph node measures 15 mm. Hilar regions are difficult to evaluate without IV contrast. No axillary adenopathy. Esophagus is grossly unremarkable. Lungs/Pleura: Centrilobular and paraseptal emphysema. Spiculated and slightly cavitary nodule in the apical segment right upper lobe measures 21.7 mm (4/41). There are extensions to the adjacent visceral pleural. Other pulmonary nodules measure 4.4 mm or less in size. Calcified left lower lobe granuloma. Basilar predominant coarsened pulmonary parenchymal ground-glass, bronchiectasis and mucoid impaction with areas of peripheral and basilar sparing. No pleural fluid. Airway is unremarkable. Upper Abdomen: Visualized portions of the liver, gallbladder and right adrenal gland are  unremarkable. Nodular thickening of the lateral limb left adrenal gland. Visualized portions of the kidneys, spleen, pancreas, stomach and bowel are otherwise unremarkable. Musculoskeletal: Mild degenerative changes in the spine. No worrisome lytic or sclerotic lesions. IMPRESSION: 1. Spiculated 21.7 mm apical segment right upper lobe nodule, highly worrisome for adenocarcinoma. Lung-RADS 4X, highly suspicious. Additional imaging evaluation or consultation with Pulmonology or Thoracic Surgery recommended. These results will be called to the ordering clinician or representative by the Radiologist Assistant, and communication documented in the PACS or [employer]. 2. Enlarged low right paratracheal and AP window lymph  nodes, worrisome for metastatic disease. Enlarged mediastinal lymph nodes can also be seen in the setting of interstitial lung disease. 3. Lower lung zone predominant coarsened pulmonary parenchymal ground-glass and bronchiectasis, findings which can be seen with interstitial lung disease such as chronic hypersensitivity pneumonitis or nonspecific interstitial pneumonitis. 4.  Aortic atherosclerosis (ICD10-I70.0). 5.  Emphysema (ICD10-J43.9). Electronically Signed   By: Lorin Picket M.D.   On: 05/22/2020 15:53   CT Super D Chest Wo Contrast  Result Date: 06/02/2020 CLINICAL DATA:  History of lung nodules in a 70 year old male. EXAM: CT CHEST WITHOUT CONTRAST TECHNIQUE: Multidetector CT imaging of the chest was performed using thin slice collimation for electromagnetic bronchoscopy planning purposes, without intravenous contrast. COMPARISON:  May 22, 2020 FINDINGS: Cardiovascular: Normal caliber of the thoracic aorta. Scattered aortic atherosclerosis. Heart size normal without pericardial effusion. Limited assessment of cardiovascular structures given lack of intravenous contrast. Mediastinum/Nodes: Thoracic inlet structures are normal. No axillary lymphadenopathy. Scattered mediastinal lymph  nodes show no change, see PET evaluation of the same date for further detail. AP window lymph node is the largest lymph node in the chest measuring approximately 14 mm short axis within 1 mm of previous measurement. No subcarinal adenopathy. Mildly enlarged RIGHT paratracheal lymph nodes. No gross hilar lymphadenopathy. Lungs/Pleura: Stable spiculated partially cavitary RIGHT upper lobe pulmonary nodule unchanged since recent imaging measuring approximately 2.2 x 2.0 cm. Bandlike changes extend to the periphery of the RIGHT upper lobe. Signs of pulmonary emphysema. Ground-glass and septal thickening with signs of bronchiectasis with basilar predominance. No signs of honeycombing. Paraseptal emphysema at the lung apices. No effusion. No consolidation. Upper Abdomen: Incidental imaging of upper abdominal contents without acute process. Musculoskeletal: Signs of cervical and upper thoracic anterior spinal fusion and discectomy with similar appearance, incompletely imaged. IMPRESSION: 1. Stable spiculated partially cavitary RIGHT upper lobe pulmonary nodule unchanged since recent imaging. Findings remain concerning for primary bronchogenic neoplasm. Referral to multi disciplinary thoracic oncologic setting is suggested if not yet performed. Findings remain associated with mildly enlarged mediastinal lymph nodes. Refer to separate PET interpretation for further detail. 2. Signs of pulmonary emphysema and interstitial lung disease. 3. Emphysema and aortic atherosclerosis. Aortic Atherosclerosis (ICD10-I70.0) and Emphysema (ICD10-J43.9). Electronically Signed   By: Zetta Bills M.D.   On: 06/02/2020 15:52   DG C-ARM BRONCHOSCOPY  Result Date: 06/06/2020 C-ARM BRONCHOSCOPY: Fluoroscopy was utilized by the requesting physician.  No radiographic interpretation.      IMPRESSION/PLAN: 19. 70 y.o. 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 Today, we talked  to the patient and family about the findings and workup thus far. We do recommend a Brain MRI to complete his disease staging. We discussed the natural history of non-small cell carcinoma and general treatment, highlighting the role of radiotherapy in the management, pending his MRI brain scan is negative for metastatic disease. We discussed the available radiation techniques, and focused on the details of logistics and delivery. Pending there is no evidence of metastatic disease on brain MRI, the recommendation is to proceed with a 6.5 week course of daily radiotherapy concurrent with systemic chemotherapy. We reviewed the anticipated acute and late sequelae associated with radiation in this setting. The patient was encouraged to ask questions that were answered to his stated satisfaction.  At the conclusion of our conversation, the patient is in agreement to proceed with MRI brain to complete disease staging and pending there  is no evidence of metastatic disease, he would  like to move forward with the recommended 6.5 week course of daily radiotherapy concurrent with systemic chemotherapy. Once we have the results from MRI, we will move forward with treatment planning accordingly. We enjoyed meeting him and his wife today and look forward to continuing to participate in his care.  We spent 70 minutes minutes in consultation with the patient and more than 50% of that time was spent in chart review, documentation, counseling and/or coordination of care.   Nicholos Johns, PA-C    Tyler Pita, MD  [facility] Direct Dial: [phone]  Fax: [phone] [email or web address]  Skype  LinkedIn

## 2020-06-16 DIAGNOSIS — C61 Malignant neoplasm of prostate: Secondary | ICD-10-CM | POA: Insufficient documentation

## 2020-06-20 ENCOUNTER — Encounter: Payer: Self-pay | Admitting: Radiation Oncology

## 2020-06-20 ENCOUNTER — Inpatient Hospital Stay (HOSPITAL_BASED_OUTPATIENT_CLINIC_OR_DEPARTMENT_OTHER): Payer: Medicare Other | Admitting: Oncology

## 2020-06-20 ENCOUNTER — Telehealth: Payer: Self-pay | Admitting: *Deleted

## 2020-06-20 ENCOUNTER — Other Ambulatory Visit: Payer: Self-pay

## 2020-06-20 ENCOUNTER — Encounter: Payer: Self-pay | Admitting: Medical Oncology

## 2020-06-20 ENCOUNTER — Ambulatory Visit
Admission: RE | Admit: 2020-06-20 | Discharge: 2020-06-20 | Disposition: A | Discharge status: Home / Self Care | Payer: Medicare Other | Source: Ambulatory Visit | Attending: Radiation Oncology | Admitting: Radiation Oncology

## 2020-06-20 VITALS — BP 116/81 | HR 80 | Temp 97.2°F | Resp 18 | Ht 70.0 in | Wt 193.1 lb

## 2020-06-20 DIAGNOSIS — I10 Essential (primary) hypertension: Secondary | ICD-10-CM

## 2020-06-20 DIAGNOSIS — N183 Chronic kidney disease, stage 3 unspecified: Secondary | ICD-10-CM | POA: Insufficient documentation

## 2020-06-20 DIAGNOSIS — F17210 Nicotine dependence, cigarettes, uncomplicated: Secondary | ICD-10-CM | POA: Insufficient documentation

## 2020-06-20 DIAGNOSIS — C61 Malignant neoplasm of prostate: Secondary | ICD-10-CM

## 2020-06-20 DIAGNOSIS — M129 Arthropathy, unspecified: Secondary | ICD-10-CM | POA: Diagnosis not present

## 2020-06-20 DIAGNOSIS — C3411 Malignant neoplasm of upper lobe, right bronchus or lung: Secondary | ICD-10-CM

## 2020-06-20 DIAGNOSIS — E7800 Pure hypercholesterolemia, unspecified: Secondary | ICD-10-CM | POA: Diagnosis not present

## 2020-06-20 DIAGNOSIS — C3490 Malignant neoplasm of unspecified part of unspecified bronchus or lung: Secondary | ICD-10-CM

## 2020-06-20 DIAGNOSIS — Z87891 Personal history of nicotine dependence: Secondary | ICD-10-CM

## 2020-06-20 DIAGNOSIS — E785 Hyperlipidemia, unspecified: Secondary | ICD-10-CM | POA: Insufficient documentation

## 2020-06-20 DIAGNOSIS — C3491 Malignant neoplasm of unspecified part of right bronchus or lung: Secondary | ICD-10-CM

## 2020-06-20 DIAGNOSIS — Z720 Tobacco use: Secondary | ICD-10-CM | POA: Insufficient documentation

## 2020-06-20 DIAGNOSIS — Z860100 Personal history of colon polyps, unspecified: Secondary | ICD-10-CM | POA: Insufficient documentation

## 2020-06-20 DIAGNOSIS — N529 Male erectile dysfunction, unspecified: Secondary | ICD-10-CM | POA: Insufficient documentation

## 2020-06-20 DIAGNOSIS — R590 Localized enlarged lymph nodes: Secondary | ICD-10-CM

## 2020-06-20 DIAGNOSIS — Z8249 Family history of ischemic heart disease and other diseases of the circulatory system: Secondary | ICD-10-CM

## 2020-06-20 DIAGNOSIS — M5431 Sciatica, right side: Secondary | ICD-10-CM | POA: Insufficient documentation

## 2020-06-20 NOTE — Consult Note (Signed)
[facility]     06/20/2020   --------------------------------------------------------------------------------   Jason [name]  MRN: [id number]  DOB: July 31, 1950, 70 year old Male  SSN: [id number]   PRIMARY CARE:  [name] Koirala, MD  REFERRING:  Ardis Hughs, MD  PROVIDER:  Louis Meckel, M.D.  TREATING:  Raynelle Bring, M.D.  LOCATION:  [facility] [phone]     --------------------------------------------------------------------------------   CC/HPI: CC: Prostate Cancer   Physician requesting consult: Dr. Burman Nieves  PCP: Dr. 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 He was found to have an elevated PSA of 10.6 prompting a TRUS biopsy of the prostate on 05/22/20. This demonstrated Gleason 4+5=9 adenocarcinoma of the prostate with 8 out of 13 biopsy cores positive for malignancy.   He also underwent a recent chest CT for lung cancer screening on 05/22/20 that demonstrated a 2.5 cm right upper lobe pulmonary nodule with mediastinal lymphadenopathy. FDG PET imaging on 5/6 raised concern for a primary lung cancer. He subsequently underwent bronchoscopy and biopsy that confirmed squamous cell carcinoma of the lung. He has stage IIIA disease and is going to proceed with treatment with chemotherapy/radiation under the care Dr. Earlie Server and Dr. Tammi Klippel. He did have an FDG PET scan that was reviewed today. This demonstrated no evidence of pelvic or retroperitoneal lymphadenopathy or bone lesions. However, he has not yet undergone his bone imaging for his high-risk prostate cancer.   Family history: None.   PMH: He has a history of hyperlipidemia, arthritis, hypertension, and depression.  PSH: No abdominal surgeries.   TNM stage: cT1c N0 M0  PSA: 10.6   Gleason score: 4+5=9 (GG 5)  Biopsy (05/22/20): 8/13 cores positive  Left: L apex (5%), L lateral mid (60%, 4+5=9), L mid (70%, 3+4=7), L lateral base (40%, 4+5=9), L base (5%, 3+3=6)  Right: R mid (10%, 3+4=7), R lateral mid (60%, 4+4=8), R lateral mid #2 (70%, 3+5=8)  Prostate volume: 23.0 cc   Nomogram  OC disease: 17%  EPE: 80%  SVI: 27%  LNI: 27%  PFS (5 year, 10 year): 42%, 28%     ALLERGIES: No Allergies    MEDICATIONS: Tadalafil 5 mg tablet 1-4 tablets daily as needed for erections  Atorvastatin Calcium 10 mg tablet  Azelastine Hcl  Bupropion Hcl  Claritin 10 mg tablet  Flonase Allergy Relief  Levocetirizine Dihydrochloride  Montelukast Sodium  Valsartan-Hydrochlorothiazide 320 mg-25 mg tablet     GU PSH: Prostate Needle Biopsy - 05/22/2020       PSH Notes: Cervical back surgery - disectomy and fusion   NON-GU PSH: Rotator Cuff Surgery.., Right Surgical Pathology, Gross And Microscopic Examination For Prostate Needle - 05/22/2020 Wrist Arthroscopy/surgery, Right     GU PMH: Prostate Cancer - 05/29/2020 ED due to arterial insufficiency - 04/17/2020 Elevated PSA - 04/17/2020, - 09/15/2018    NON-GU PMH: Solitary pulmonary nodule - 05/29/2020 Arthritis Encounter for general adult medical examination without abnormal findings, Encounter for preventive health examination Hypercholesterolemia Hypertension    FAMILY HISTORY: No Family History    SOCIAL HISTORY: Marital Status: Married Preferred Language: English; Ethnicity: Not Hispanic Or Latino; Race: Black or African American Current Smoking Status: Patient smokes.   Tobacco Use Assessment Completed: Used Tobacco in last 30 days? Does not use smokeless tobacco. Does drink.  Does not use  drugs. Drinks 2 caffeinated drinks per day.    REVIEW OF SYSTEMS:    GU Review Male:   Patient denies frequent urination, hard to postpone urination, burning/ pain with urination, get up at night to urinate, leakage of  urine, stream starts and stops, trouble starting your streams, and have to strain to urinate .  Gastrointestinal (Upper):   Patient denies nausea and vomiting.  Gastrointestinal (Lower):   Patient denies diarrhea and constipation.  Constitutional:   Patient denies fever, night sweats, weight loss, and fatigue.  Skin:   Patient denies skin rash/ lesion and itching.  Eyes:   Patient denies blurred vision and double vision.  Ears/ Nose/ Throat:   Patient denies sore throat and sinus problems.  Hematologic/Lymphatic:   Patient denies swollen glands and easy bruising.  Cardiovascular:   Patient denies leg swelling and chest pains.  Respiratory:   Patient denies cough and shortness of breath.  Endocrine:   Patient denies excessive thirst.  Musculoskeletal:   Patient denies back pain and joint pain.  Neurological:   Patient denies headaches and dizziness.  Psychologic:   Patient denies depression and anxiety.   VITAL SIGNS: None   MULTI-SYSTEM PHYSICAL EXAMINATION:    Constitutional: Well-nourished. No physical deformities. Normally developed. Good grooming.     Complexity of Data:  Lab Test Review:   PSA  Records Review:   Pathology Reports, Previous Patient Records  X-Ray Review: PET Scan: Reviewed Films.  C.T. Chest: Reviewed Films.     04/17/20 10/26/18 09/16/18 08/05/18  PSA  Total PSA 10.60 ng/mL 6.64 ng/mL 6.89 ng/mL 7.33 ng/dl  Free PSA   1.03 ng/mL   % Free PSA   15 % PSA     PROCEDURES: None   ASSESSMENT:      ICD-10 Details  1 GU:   Prostate Cancer - C61    PLAN:           Document Letter(s):  Created for Patient: Clinical Summary         Notes:   1. High-risk prostate cancer: I had a detailed discussion with Jason [name] and his wife today. He has already had a discussion with Dr. Alen Blew and Dr. Tammi Klippel. The patient was counseled about the natural history of prostate cancer and the standard treatment options that are available for prostate cancer. It was explained to  him how his age and life expectancy, clinical stage, Gleason score, and PSA affect his prognosis, the decision to proceed with additional staging studies, as well as how that information influences recommended treatment strategies. We discussed the roles for active surveillance, radiation therapy, surgical therapy, androgen deprivation, as well as ablative therapy options for the treatment of prostate cancer as appropriate to his individual cancer situation. We discussed the risks and benefits of these options with regard to their impact on cancer control and also in terms of potential adverse events, complications, and impact on quality of life particularly related to urinary and sexual function. The patient was encouraged to ask questions throughout the discussion today and all questions were answered to his stated satisfaction. In addition, the patient was provided with and/or directed to appropriate resources and literature for further education about prostate cancer and treatment options.   We reviewed options for treatment of high risk prostate cancer today and specifically discussed the option of primary surgical therapy likely in this setting of multimodality therapy vs. upfront androgen deprivation and external beam radiation therapy. Taking into account his need for treatment of his  lung cancer, it does make sense to consider systemic androgen deprivation therapy eventually followed by external beam radiation therapy once he has finished and recovered from his lung cancer treatment. This would likely be easiest on him in still offers him an excellent chance for curative therapy. He does still need to undergo bone imaging and was felt that a bone scan would be sufficient considering his other imaging that has already been obtained. I will notify Dr. Louis Meckel so that he can start androgen deprivation in get his bone imaging done.   Cc: Dr. Burman Nieves  Dr. Lauretta Grill Koirala  Dr. Zola Button  Dr. Tyler Pita    E & M CODES: We spent 34 minutes dedicated to evaluation and management time, including face to face interaction, discussions on coordination of care, documentation, result review, and discussion with others as applicable.

## 2020-06-20 NOTE — Progress Notes (Signed)
Radiation Oncology         [phone]) [phone] ________________________________  Multidisciplinary Prostate Cancer Clinic  Initial Radiation Oncology Consultation  Name: Jason [name]. MRN: [id number]  Date: 06/20/2020  DOB: [birth year]  OE:VOJJKKX, [name], MD  Ardis Hughs, MD   REFERRING PHYSICIAN: Ardis Hughs, MD  DIAGNOSIS: 70 y.o. 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    ICD-10-CM   1. Malignant neoplasm of prostate ([place])  C61   2. Non-small cell lung cancer, right ([place])  C34.91     HISTORY OF PRESENT ILLNESS::Jason [name]. is a 70 y.o. gentleman.  He was noted to have an elevated PSA of 7.33 in 07/2018 by his primary care physician, Dr. Dorthy Cooler.  Accordingly, he was referred for evaluation in urology by Dr. Louis Meckel on 09/15/18, repeat PSA that day remained stable elevated at 6.89. He was lost to follow up at that time. More recently, he was referred back to Dr. Louis Meckel on 04/17/20 due to further increase in his PSA at 8.7 in 01/2020, digital rectal examination performed at that time showed no nodules. The patient proceeded to transrectal ultrasound with 12 biopsies of the prostate on 05/22/20.  The prostate volume measured 23 cc.  Out of 13 core biopsies, 8 were positive.  The maximum Gleason score was 4+5, and this was seen in the left base lateral and left mid lateral. Additionally, Gleason 8 was seen in both the right mid lateral cores (one 3+5, and one 4+4), Gleason 3+4 was seen in the left mid and right mid, and small foci of Gleason 3+3 were seen in the left apex and left base.    On screening chest CT performed on 05/22/20, he was found to have a suspicious, 2 cm spiculated lung nodule in the apicl segment of the right upper lobe as well as enlarged low right paratracheal and AP  window lymph nodes, worrisome for metastatic disease. A PET scan was performed on 06/02/20 for further evaluation and this confirmed hypermetabolism in the right upper lobe nodule with cavitation, favoring primary bronchogenic neoplasm with associated RIGHT paratracheal nodal involvement. Additionally, there was a hypermetabolic focus in the mid gland prostate on the right, consistent with his recent prostate cancer diagnosis but no suspicious bone lesions or pelvic nodal disease.  The patient reviewed the biopsy and imaging results with his urologist and he has kindly been referred today to the multidisciplinary prostate cancer clinic for presentation of pathology and radiology studies in our conference for discussion of potential radiation treatment options and clinical evaluation.   He was seen in the multidisciplinary thoracic oncology clinic on 06/15/20 for discussion of treatment options for the newly diagnosed lung cancer as documented in our consultation note. He is scheduled for brain MRI on 06/22/20 to complete his staging workup for the lung cancer and pending there is no evidence of metastatic disease on this scan, the recommendation is to proceed with a 6.5 week course of daily radiotherapy concurrent with systemic chemotherapy.  PREVIOUS RADIATION THERAPY: No  PAST MEDICAL HISTORY:  has a past medical history of Arthritis, Hypertension, Prostate cancer ([place]), and Tobacco use.    PAST SURGICAL HISTORY: Past Surgical History:  Procedure Laterality Date  . BACK SURGERY    . BRONCHIAL BIOPSY  06/06/2020   Procedure: BRONCHIAL BIOPSIES;  Surgeon: Garner Nash, DO;  Location: [facility];  Service: Pulmonary;;  . BRONCHIAL BRUSHINGS  06/06/2020   Procedure: BRONCHIAL BRUSHINGS;  Surgeon: Garner Nash, DO;  Location: [facility];  Service: Pulmonary;;  . BRONCHIAL NEEDLE ASPIRATION BIOPSY  06/06/2020   Procedure: BRONCHIAL NEEDLE ASPIRATION BIOPSIES;  Surgeon: Garner Nash, DO;   Location: [facility];  Service: Pulmonary;;  . BRONCHIAL WASHINGS  06/06/2020   Procedure: BRONCHIAL WASHINGS;  Surgeon: Garner Nash, DO;  Location: [facility];  Service: Pulmonary;;  . COLONOSCOPY    . OTHER SURGICAL HISTORY     fluid drawn off his testicle  . PROSTATE BIOPSY    . ROTATOR CUFF REPAIR Right   . VIDEO BRONCHOSCOPY WITH ENDOBRONCHIAL NAVIGATION Right 06/06/2020   Procedure: VIDEO BRONCHOSCOPY WITH ENDOBRONCHIAL NAVIGATION;  Surgeon: Garner Nash, DO;  Location: [facility];  Service: Pulmonary;  Laterality: Right;  Marland Kitchen VIDEO BRONCHOSCOPY WITH ENDOBRONCHIAL ULTRASOUND Bilateral 06/06/2020   Procedure: VIDEO BRONCHOSCOPY WITH ENDOBRONCHIAL ULTRASOUND;  Surgeon: Garner Nash, DO;  Location: [facility];  Service: Pulmonary;  Laterality: Bilateral;  . WRIST SURGERY      FAMILY HISTORY: family history includes Heart failure in his mother; Leukemia in his father; Mesothelioma in his brother.  SOCIAL HISTORY:  reports that he quit smoking about 3 weeks ago. His smoking use included cigarettes. He started smoking about 52 years ago. He has a 52.50 pack-year smoking history. He has never used smokeless tobacco. He reports current alcohol use. He reports previous drug use.  ALLERGIES: Patient has no known allergies.  MEDICATIONS:  Current Outpatient Medications  Medication Sig Dispense Refill  . azelastine (ASTELIN) 0.1 % nasal spray Place 1 spray into both nostrils 2 (two) times daily as needed for rhinitis. Use in each nostril as directed    . fluticasone (FLONASE) 50 MCG/ACT nasal spray Place 2 sprays into both nostrils daily.    Marland Kitchen levocetirizine (XYZAL) 5 MG tablet Take 5 mg by mouth every evening.    . montelukast (SINGULAIR) 10 MG tablet Take 10 mg by mouth at bedtime.    . valsartan-hydrochlorothiazide (DIOVAN-HCT) 320-25 MG tablet Take 1 tablet by mouth daily.    Marland Kitchen acetaminophen (TYLENOL) 325 MG tablet Take 650 mg by mouth every 6 (six) hours as needed.  (Patient not taking: Reported on 06/20/2020)    . atorvastatin (LIPITOR) 10 MG tablet Take 10 mg by mouth daily. (Patient not taking: Reported on 06/20/2020)    . ketoconazole (NIZORAL) 2 % shampoo  (Patient not taking: Reported on 06/20/2020)    . prochlorperazine (COMPAZINE) 10 MG tablet Take 1 tablet (10 mg total) by mouth every 6 (six) hours as needed for nausea or vomiting. (Patient not taking: Reported on 06/20/2020) 30 tablet 0  . vitamin B-12 (CYANOCOBALAMIN) 500 MCG tablet Take 500 mcg by mouth daily. (Patient not taking: Reported on 06/20/2020)     No current facility-administered medications for this encounter.    REVIEW OF SYSTEMS:  On review of systems, the patient reports that he is doing well overall. He denies any chest pain, shortness of breath, cough, fevers, chills, night sweats, unintended weight changes. He denies any bowel disturbances, and denies abdominal pain, nausea or vomiting. He denies any new musculoskeletal or joint aches or pains. His IPSS was 6, indicating mild urinary symptoms. His SHIM was 8, indicating he has moderate erectile dysfunction. A complete review of systems is obtained and is otherwise negative.   PHYSICAL EXAM:  Wt Readings from Last 3  Encounters:  06/20/20 193 lb 2 oz (87.6 kg)  06/15/20 191 lb 14.4 oz (87 kg)  06/06/20 188 lb (85.3 kg)   Temp Readings from Last 3 Encounters:  06/20/20 (!) 97.2 F (36.2 C) (Temporal)  06/15/20 (!) 97.3 F (36.3 C) (Tympanic)  06/06/20 98.4 F (36.9 C)   BP Readings from Last 3 Encounters:  06/20/20 116/81  06/15/20 133/77  06/06/20 118/83   Pulse Readings from Last 3 Encounters:  06/20/20 80  06/15/20 60  06/06/20 73   Pain Assessment Pain Score: 0-No pain/10  In general this is a well appearing African American male in no acute distress. He is alert and oriented x4 and appropriate throughout the examination. HEENT reveals that the patient is normocephalic, atraumatic. EOMs are intact. PERRLA. Skin is  intact without any evidence of gross lesions. Cardiopulmonary assessment is negative for acute distress and he exhibits normal effort. The abdomen is soft, non tender, non distended. Lower extremities are negative for pretibial pitting edema, deep calf tenderness, cyanosis or clubbing.  KPS = 100  100 - Normal; no complaints; no evidence of disease. 90   - Able to carry on normal activity; minor signs or symptoms of disease. 80   - Normal activity with effort; some signs or symptoms of disease. 13   - Cares for self; unable to carry on normal activity or to do active work. 60   - Requires occasional assistance, but is able to care for most of his personal needs. 50   - Requires considerable assistance and frequent medical care. 40   - Disabled; requires special care and assistance. 79   - Severely disabled; hospital admission is indicated although death not imminent. 95   - Very sick; hospital admission necessary; active supportive treatment necessary. 10   - Moribund; fatal processes progressing rapidly. 0     - Dead  Karnofsky DA, Abelmann Rufus, Craver LS and Burchenal [facility] [phone]) The use of the nitrogen mustards in the palliative treatment of carcinoma: with particular reference to bronchogenic carcinoma Cancer 1 [id number]   LABORATORY DATA:  Lab Results  Component Value Date   WBC 5.3 06/15/2020   HGB 11.8 (L) 06/15/2020   HCT 35.9 (L) 06/15/2020   MCV 88.0 06/15/2020   PLT 259 06/15/2020   Lab Results  Component Value Date   NA 140 06/15/2020   K 4.4 06/15/2020   CL 108 06/15/2020   CO2 23 06/15/2020   Lab Results  Component Value Date   ALT 13 06/15/2020   AST 15 06/15/2020   ALKPHOS 94 06/15/2020   BILITOT 0.5 06/15/2020     RADIOGRAPHY: NM PET Image Initial (PI) Skull Base To Thigh  Result Date: 06/02/2020 CLINICAL DATA:  Initial treatment strategy for pulmonary nodule, patient also with reported history of prostate cancer. EXAM: NUCLEAR MEDICINE PET SKULL BASE TO THIGH  TECHNIQUE: 9.63 mCi F-18 FDG was injected intravenously. Full-ring PET imaging was performed from the skull base to thigh after the radiotracer. CT data was obtained and used for attenuation correction and anatomic localization. Fasting blood glucose: 90 mg/dl COMPARISON:  Prior chest imaging from today and also from May 22, 2020. FINDINGS: Mediastinal blood pool activity: SUV max 2.43 Liver activity: SUV max NA NECK: No hypermetabolic lymph nodes in the neck. Incidental CT findings: none CHEST: Slightly greater than 2 cm RIGHT upper lobe pulmonary nodule displays maximum SUV of 19.7. Enlarged RIGHT paratracheal lymph node (image 68/4) 12 mm with a maximum SUV of 8.7. Approximately  14 mm AP window lymph node with a maximum SUV of 3.1. No additional either enlarged or hypermetabolic lymph nodes in the chest. Signs of suspected interstitial lung disease with patchy areas of increased metabolic activity. Incidental CT findings: none ABDOMEN/PELVIS: No abnormal hypermetabolic activity within the liver, pancreas, adrenal glands, or spleen. No hypermetabolic lymph nodes in the abdomen or pelvis. Incidental CT findings: Benign appearing thickening of the LEFT adrenal gland low-density compatible with benign process and without hypermetabolic changes. No acute hepatic, biliary or pancreatic findings by noncontrast imaging. Spleen, adrenal glands, kidneys and urinary bladder otherwise unremarkable. Signs of colonic diverticulosis. Normal appendix. No acute gastrointestinal findings. Hypermetabolic focus in the RIGHT prostate gland with a maximum SUV of 5.6 in this patient with known prostate cancer. This is in the RIGHT mid prostate (image 177 measuring approximately 1.1 cm. No signs of pelvic adenopathy hypermetabolic by FDG PET. Small hydrocele on the RIGHT. SKELETON: No focal hypermetabolic activity to suggest skeletal metastasis. Incidental CT findings: Post multilevel ACDF in the cervical spine, spinal degenerative  changes. IMPRESSION: 1. Spiculated RIGHT upper lobe nodule with cavitation, based on morphologic features and location favors bronchogenic neoplasm with associated RIGHT paratracheal nodal involvement. 2. Enlarged AP window lymph node shows only mild FDG uptake, only slightly above mediastinal blood pool. Findings are equivocal, suspicious based mainly on size but potentially reactive in the setting of interstitial lung disease. 3. Signs of prostate cancer with hypermetabolic focus in the prostate bed in the mid gland on the RIGHT. Correlation with biopsy results is suggested. 4. No suspicious bone lesions or pelvic nodal disease. 5. Aortic atherosclerosis. 6. Pulmonary emphysema. 7. Signs of interstitial lung disease. Aortic Atherosclerosis (ICD10-I70.0) and Emphysema (ICD10-J43.9). Electronically Signed   By: Zetta Bills M.D.   On: 06/02/2020 16:16   DG CHEST PORT 1 VIEW  Result Date: 06/06/2020 CLINICAL DATA:  Status post bronchoscopy.  Pulmonary nodule. EXAM: PORTABLE CHEST 1 VIEW COMPARISON:  Single-view of the chest 10/25/2013. CT chest 06/02/2020. FINDINGS: No pneumothorax after bronchoscopy. Right upper lobe pulmonary nodule is identified. There is some coarsening of the pulmonary interstitium. No consolidative process. Heart size is normal. No acute or focal bony abnormality. IMPRESSION: Negative for pneumothorax after biopsy of a right upper lobe pulmonary nodule. No acute finding. Electronically Signed   By: Inge Rise M.D.   On: 06/06/2020 14:03   CT CHEST LUNG CA SCREEN LOW DOSE W/O CM  Result Date: 05/22/2020 CLINICAL DATA:  Current smoker, 22 pack-year history. EXAM: CT CHEST WITHOUT CONTRAST LOW-DOSE FOR LUNG CANCER SCREENING TECHNIQUE: Multidetector CT imaging of the chest was performed following the standard protocol without IV contrast. COMPARISON:  None. FINDINGS: Cardiovascular: Atherosclerotic calcification of the aorta. Heart is at the upper limits of normal in size. No  pericardial effusion. Mediastinum/Nodes: Low right paratracheal lymph node measures 11 mm. AP window lymph node measures 15 mm. Hilar regions are difficult to evaluate without IV contrast. No axillary adenopathy. Esophagus is grossly unremarkable. Lungs/Pleura: Centrilobular and paraseptal emphysema. Spiculated and slightly cavitary nodule in the apical segment right upper lobe measures 21.7 mm (4/41). There are extensions to the adjacent visceral pleural. Other pulmonary nodules measure 4.4 mm or less in size. Calcified left lower lobe granuloma. Basilar predominant coarsened pulmonary parenchymal ground-glass, bronchiectasis and mucoid impaction with areas of peripheral and basilar sparing. No pleural fluid. Airway is unremarkable. Upper Abdomen: Visualized portions of the liver, gallbladder and right adrenal gland are unremarkable. Nodular thickening of the lateral limb left adrenal gland. Visualized  portions of the kidneys, spleen, pancreas, stomach and bowel are otherwise unremarkable. Musculoskeletal: Mild degenerative changes in the spine. No worrisome lytic or sclerotic lesions. IMPRESSION: 1. Spiculated 21.7 mm apical segment right upper lobe nodule, highly worrisome for adenocarcinoma. Lung-RADS 4X, highly suspicious. Additional imaging evaluation or consultation with Pulmonology or Thoracic Surgery recommended. These results will be called to the ordering clinician or representative by the Radiologist Assistant, and communication documented in the PACS or [employer]. 2. Enlarged low right paratracheal and AP window lymph nodes, worrisome for metastatic disease. Enlarged mediastinal lymph nodes can also be seen in the setting of interstitial lung disease. 3. Lower lung zone predominant coarsened pulmonary parenchymal ground-glass and bronchiectasis, findings which can be seen with interstitial lung disease such as chronic hypersensitivity pneumonitis or nonspecific interstitial pneumonitis. 4.   Aortic atherosclerosis (ICD10-I70.0). 5.  Emphysema (ICD10-J43.9). Electronically Signed   By: Lorin Picket M.D.   On: 05/22/2020 15:53   CT Super D Chest Wo Contrast  Result Date: 06/02/2020 CLINICAL DATA:  History of lung nodules in a 70 year old male. EXAM: CT CHEST WITHOUT CONTRAST TECHNIQUE: Multidetector CT imaging of the chest was performed using thin slice collimation for electromagnetic bronchoscopy planning purposes, without intravenous contrast. COMPARISON:  May 22, 2020 FINDINGS: Cardiovascular: Normal caliber of the thoracic aorta. Scattered aortic atherosclerosis. Heart size normal without pericardial effusion. Limited assessment of cardiovascular structures given lack of intravenous contrast. Mediastinum/Nodes: Thoracic inlet structures are normal. No axillary lymphadenopathy. Scattered mediastinal lymph nodes show no change, see PET evaluation of the same date for further detail. AP window lymph node is the largest lymph node in the chest measuring approximately 14 mm short axis within 1 mm of previous measurement. No subcarinal adenopathy. Mildly enlarged RIGHT paratracheal lymph nodes. No gross hilar lymphadenopathy. Lungs/Pleura: Stable spiculated partially cavitary RIGHT upper lobe pulmonary nodule unchanged since recent imaging measuring approximately 2.2 x 2.0 cm. Bandlike changes extend to the periphery of the RIGHT upper lobe. Signs of pulmonary emphysema. Ground-glass and septal thickening with signs of bronchiectasis with basilar predominance. No signs of honeycombing. Paraseptal emphysema at the lung apices. No effusion. No consolidation. Upper Abdomen: Incidental imaging of upper abdominal contents without acute process. Musculoskeletal: Signs of cervical and upper thoracic anterior spinal fusion and discectomy with similar appearance, incompletely imaged. IMPRESSION: 1. Stable spiculated partially cavitary RIGHT upper lobe pulmonary nodule unchanged since recent imaging. Findings  remain concerning for primary bronchogenic neoplasm. Referral to multi disciplinary thoracic oncologic setting is suggested if not yet performed. Findings remain associated with mildly enlarged mediastinal lymph nodes. Refer to separate PET interpretation for further detail. 2. Signs of pulmonary emphysema and interstitial lung disease. 3. Emphysema and aortic atherosclerosis. Aortic Atherosclerosis (ICD10-I70.0) and Emphysema (ICD10-J43.9). Electronically Signed   By: Zetta Bills M.D.   On: 06/02/2020 15:52   DG C-ARM BRONCHOSCOPY  Result Date: 06/06/2020 C-ARM BRONCHOSCOPY: Fluoroscopy was utilized by the requesting physician.  No radiographic interpretation.      IMPRESSION/PLAN: 70 y.o. gentleman with Stage T1c adenocarcinoma of the prostate with a Gleason score of 4+5 and a PSA of 8.7.    We discussed the patient's workup and outlined the nature of prostate cancer in this setting. The patient's T stage, Gleason's score, and PSA put him into the high risk group. We did discuss the recommendation for bone scan to complete his disease staging but pending this confirms localized disease only, he is eligible for a variety of potential treatment options including LT-ADT in combination with 8 weeks of  external radiation or prostatectomy. We discussed the available radiation techniques, and focused on the details and logistics of delivery. We discussed and outlined the risks, benefits, short and long-term effects associated with radiotherapy and compared and contrasted these with prostatectomy. We discussed the role of SpaceOAR gel in reducing the rectal toxicity associated with radiotherapy. We also detailed the role of ADT in the treatment of high risk prostate cancer and outlined the associated side effects that could be expected with this therapy. He appears to have a good understanding of his disease and our treatment recommendations which are of curative intent.  He was encouraged to ask questions  that were answered to his stated satisfaction.  At the end of the conversation the patient is interested in moving forward with bone scan to complete his disease staging but pending this confirms localized disease only, he favors proceeding with LT-ADT in combination with 8 weeks of external radiation to the prostate and pelvic lymph nodes. We will share our discussion with Dr. Louis Meckel so that he can coordinate the bone scan imaging, first available as well as a follow up visit to initiate ADT now. Once we have the results of this scan and confirm appropriate treatment, we will proceed with treatment planning accordingly. He will need fiducial markers with SpaceOAR gel placement in 08/2020, prior to simulation, to reduce rectal toxicity from radiotherapy. We would anticipate starting his daily prostate treatments in August or September 2022, once he has completed and recovered from his lung cancer treatment which we will also be involved in. He and his wife know that they are welcome to call at any time in the interim with questions or concerns regarding radiotherapy.     Nicholos Johns, PA-C    Tyler Pita, MD  [facility] Direct Dial: [phone]  Fax: [phone] [email or web address]  Skype  [email or web address]   This document serves as a record of services personally performed by Tyler Pita, MD and Freeman Caldron, PA-C. It was created on their behalf by Wilburn Mylar, a trained medical scribe. The creation of this record is based on the scribe's personal observations and the provider's statements to them. This document has been checked and approved by the attending provider.

## 2020-06-20 NOTE — Progress Notes (Signed)
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 - T1c adenocarcinoma of the prostate with Gleason 4+5 and PSA of 8.7.   If Prostate Cancer, Gleason Score is (4 + 5) and PSA is (8.7). Prostate volume: 23 g.  Jason [name]. presented as a referral from his PCP, [name] Dorthy Cooler, MD, to Dr. Louis Meckel for further evaluation of an elevated PSA.    Biopsies of prostate (if applicable) revealed:   Past/Anticipated interventions by urology, if any: Prostate biopsy, referral to [facility]  Past/Anticipated interventions by medical oncology, if any: Patient to receive chemo and radiation for lung ca.  Weight changes, if any: no  Bowel/Bladder complaints, if any: IPSS 6. SHIM 8.    Nausea/Vomiting, if any: no  Pain issues, if any:  no  SAFETY ISSUES:  Prior radiation? no  Pacemaker/ICD? no  Possible current pregnancy? no, male patient  Is the patient on methotrexate? no  Current Complaints / other details:  70 year old male. Married. Resides in [place]. 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  Brain MRI scheduled for 06/22/2020

## 2020-06-20 NOTE — Progress Notes (Signed)
                               Care Plan Summary  Name: Jason [name] DOB: 02-Jun-1950   Your Medical Team:   Urologist -  Dr. Raynelle Bring, [facility]  Radiation Oncologist - Dr. Tyler Pita, [facility]   Medical Oncologist - Dr. Zola Button, [place]  Recommendations: 1) Bone scan for staging  2) Androgen deprivation (hormone injection)   * These recommendations are based on information available as of today's consult.      Recommendations may change depending on the results of further tests or exams.  Next Steps: 1) Dr. Carlton Adam office  will schedule bone scan 2) Dr.Herrick's' office will schedule hormone injection.   When appointments need to be scheduled, you will be contacted by [facility] and/or [facility].  Questions?  Please do not hesitate to call Cira Rue, RN, BSN, OCN at [phone] any questions or concerns.  Shirlean Mylar is your Oncology Nurse Navigator and is available to assist you while you're receiving your medical care at [facility].:

## 2020-06-20 NOTE — Progress Notes (Signed)
Reason for the request:    Prostate cancer  HPI: I was asked by Dr. Louis Meckel to evaluate Jason [name] for the evaluation of prostate cancer.  He is a 70 year old man with history of hypertension, hyperlipidemia who was found to have elevated PSA of 8.7.  He was evaluated by Dr. Louis Meckel and subsequently underwent a prostate biopsy on May 22, 2020.  At that time he was found to have a Gleason score 4+ 5 = in at least 3 cores with Gleason score 3+4 = 7 in 2 cores and Gleason 8 in 2 more cores.  He had also a CT scan of the chest for a lung nodule on Jun 02, 2020 which showed a right upper lobe pulmonary nodule concerning for primary bronchogenic lung malignancy.  PET CT scan obtained on Jun 02, 2020 showed a right upper lobe nodule with cavitation with enlarging AP window nodes showed mild uptake without any suspicious metastatic disease.  Based on these findings he was evaluated at the lung multidisciplinary clinic and underwent a bronchoscopy and a biopsy which showed squamous cell carcinoma.  He is currently under evaluation to start radiation with weekly carboplatin and paclitaxel under the care of Dr. Earlie Server.  Clinically, he reports feeling reasonably well without any plaints.  He denies any respiratory complaints.  Denies shortness of breath difficulty breathing with little to no urinary symptoms including urgency and nocturia.  He does not report any headaches, blurry vision, syncope or seizures. Does not report any fevers, chills or sweats.  Does not report any cough, wheezing or hemoptysis.  Does not report any chest pain, palpitation, orthopnea or leg edema.  Does not report any nausea, vomiting or abdominal pain.  Does not report any constipation or diarrhea.  Does not report any skeletal complaints.    Does not report frequency, urgency or hematuria.  Does not report any skin rashes or lesions. Does not report any heat or cold intolerance.  Does not report any lymphadenopathy or petechiae.  Does not  report any anxiety or depression.  Remaining review of systems is negative.    Past Medical History:  Diagnosis Date  . Arthritis    back  . Hypertension   . Prostate cancer ([place])   . Tobacco use   :  Past Surgical History:  Procedure Laterality Date  . BACK SURGERY    . BRONCHIAL BIOPSY  06/06/2020   Procedure: BRONCHIAL BIOPSIES;  Surgeon: Garner Nash, DO;  Location: [facility];  Service: Pulmonary;;  . BRONCHIAL BRUSHINGS  06/06/2020   Procedure: BRONCHIAL BRUSHINGS;  Surgeon: Garner Nash, DO;  Location: [facility];  Service: Pulmonary;;  . BRONCHIAL NEEDLE ASPIRATION BIOPSY  06/06/2020   Procedure: BRONCHIAL NEEDLE ASPIRATION BIOPSIES;  Surgeon: Garner Nash, DO;  Location: [facility];  Service: Pulmonary;;  . BRONCHIAL WASHINGS  06/06/2020   Procedure: BRONCHIAL WASHINGS;  Surgeon: Garner Nash, DO;  Location: [facility];  Service: Pulmonary;;  . COLONOSCOPY    . OTHER SURGICAL HISTORY     fluid drawn off his testicle  . PROSTATE BIOPSY    . ROTATOR CUFF REPAIR Right   . VIDEO BRONCHOSCOPY WITH ENDOBRONCHIAL NAVIGATION Right 06/06/2020   Procedure: VIDEO BRONCHOSCOPY WITH ENDOBRONCHIAL NAVIGATION;  Surgeon: Garner Nash, DO;  Location: [facility];  Service: Pulmonary;  Laterality: Right;  Marland Kitchen VIDEO BRONCHOSCOPY WITH ENDOBRONCHIAL ULTRASOUND Bilateral 06/06/2020   Procedure: VIDEO BRONCHOSCOPY WITH ENDOBRONCHIAL ULTRASOUND;  Surgeon: Garner Nash, DO;  Location: [facility];  Service: Pulmonary;  Laterality:  Bilateral;  :   Current Outpatient Medications:  .  acetaminophen (TYLENOL) 325 MG tablet, Take 650 mg by mouth every 6 (six) hours as needed., Disp: , Rfl:  .  atorvastatin (LIPITOR) 10 MG tablet, Take 10 mg by mouth daily., Disp: , Rfl:  .  azelastine (ASTELIN) 0.1 % nasal spray, Place 1 spray into both nostrils 2 (two) times daily as needed for rhinitis. Use in each nostril as directed, Disp: , Rfl:  .  fluticasone (FLONASE) 50 MCG/ACT  nasal spray, Place 2 sprays into both nostrils daily., Disp: , Rfl:  .  ketoconazole (NIZORAL) 2 % shampoo, , Disp: , Rfl:  .  levocetirizine (XYZAL) 5 MG tablet, Take 5 mg by mouth every evening., Disp: , Rfl:  .  montelukast (SINGULAIR) 10 MG tablet, Take 10 mg by mouth at bedtime., Disp: , Rfl:  .  prochlorperazine (COMPAZINE) 10 MG tablet, Take 1 tablet (10 mg total) by mouth every 6 (six) hours as needed for nausea or vomiting., Disp: 30 tablet, Rfl: 0 .  valsartan-hydrochlorothiazide (DIOVAN-HCT) 320-25 MG tablet, Take 1 tablet by mouth daily., Disp: , Rfl:  .  vitamin B-12 (CYANOCOBALAMIN) 500 MCG tablet, Take 500 mcg by mouth daily., Disp: , Rfl: :  No Known Allergies:  Family History  Problem Relation Age of Onset  . Heart failure Mother   :  Social History   Socioeconomic History  . Marital status: Married    Spouse name: Not on file  . Number of children: Not on file  . Years of education: Not on file  . Highest education level: Not on file  Occupational History  . Not on file  Tobacco Use  . Smoking status: Former Smoker    Packs/day: 1.50    Years: 35.00    Pack years: 52.50    Types: Cigarettes    Start date: 60    Quit date: 05/26/2020    Years since quitting: 0.0  . Smokeless tobacco: Never Used  Substance and Sexual Activity  . Alcohol use: Yes    Comment: rare  . Drug use: Not Currently  . Sexual activity: Not on file  Other Topics Concern  . Not on file  Social History Narrative  . Not on file   Social Determinants of Health   Financial Resource Strain: Not on file  Food Insecurity: Not on file  Transportation Needs: Not on file  Physical Activity: Not on file  Stress: Not on file  Social Connections: Not on file  Intimate Partner Violence: Not on file  :  Pertinent items are noted in HPI.  Exam: ECOG 0 General appearance: alert and cooperative appeared without distress. Head: atraumatic without any abnormalities. Eyes:  conjunctivae/corneas clear. PERRL.  Sclera anicteric. Throat: lips, mucosa, and tongue normal; without oral thrush or ulcers. Resp: clear to auscultation bilaterally without rhonchi, wheezes or dullness to percussion. Cardio: regular rate and rhythm, S1, S2 normal, no murmur, click, rub or gallop GI: soft, non-tender; bowel sounds normal; no masses,  no organomegaly Skin: Skin color, texture, turgor normal. No rashes or lesions Lymph nodes: Cervical, supraclavicular, and axillary nodes normal. Neurologic: Grossly normal without any motor, sensory or deep tendon reflexes. Musculoskeletal: No joint deformity or effusion.    NM PET Image Initial (PI) Skull Base To Thigh  Result Date: 06/02/2020 CLINICAL DATA:  Initial treatment strategy for pulmonary nodule, patient also with reported history of prostate cancer. EXAM: NUCLEAR MEDICINE PET SKULL BASE TO THIGH TECHNIQUE: 9.63 mCi F-18 FDG  was injected intravenously. Full-ring PET imaging was performed from the skull base to thigh after the radiotracer. CT data was obtained and used for attenuation correction and anatomic localization. Fasting blood glucose: 90 mg/dl COMPARISON:  Prior chest imaging from today and also from May 22, 2020. FINDINGS: Mediastinal blood pool activity: SUV max 2.43 Liver activity: SUV max NA NECK: No hypermetabolic lymph nodes in the neck. Incidental CT findings: none CHEST: Slightly greater than 2 cm RIGHT upper lobe pulmonary nodule displays maximum SUV of 19.7. Enlarged RIGHT paratracheal lymph node (image 68/4) 12 mm with a maximum SUV of 8.7. Approximately 14 mm AP window lymph node with a maximum SUV of 3.1. No additional either enlarged or hypermetabolic lymph nodes in the chest. Signs of suspected interstitial lung disease with patchy areas of increased metabolic activity. Incidental CT findings: none ABDOMEN/PELVIS: No abnormal hypermetabolic activity within the liver, pancreas, adrenal glands, or spleen. No  hypermetabolic lymph nodes in the abdomen or pelvis. Incidental CT findings: Benign appearing thickening of the LEFT adrenal gland low-density compatible with benign process and without hypermetabolic changes. No acute hepatic, biliary or pancreatic findings by noncontrast imaging. Spleen, adrenal glands, kidneys and urinary bladder otherwise unremarkable. Signs of colonic diverticulosis. Normal appendix. No acute gastrointestinal findings. Hypermetabolic focus in the RIGHT prostate gland with a maximum SUV of 5.6 in this patient with known prostate cancer. This is in the RIGHT mid prostate (image 177 measuring approximately 1.1 cm. No signs of pelvic adenopathy hypermetabolic by FDG PET. Small hydrocele on the RIGHT. SKELETON: No focal hypermetabolic activity to suggest skeletal metastasis. Incidental CT findings: Post multilevel ACDF in the cervical spine, spinal degenerative changes. IMPRESSION: 1. Spiculated RIGHT upper lobe nodule with cavitation, based on morphologic features and location favors bronchogenic neoplasm with associated RIGHT paratracheal nodal involvement. 2. Enlarged AP window lymph node shows only mild FDG uptake, only slightly above mediastinal blood pool. Findings are equivocal, suspicious based mainly on size but potentially reactive in the setting of interstitial lung disease. 3. Signs of prostate cancer with hypermetabolic focus in the prostate bed in the mid gland on the RIGHT. Correlation with biopsy results is suggested. 4. No suspicious bone lesions or pelvic nodal disease. 5. Aortic atherosclerosis. 6. Pulmonary emphysema. 7. Signs of interstitial lung disease. Aortic Atherosclerosis (ICD10-I70.0) and Emphysema (ICD10-J43.9). Electronically Signed   By: Zetta Bills M.D.   On: 06/02/2020 16:16    CT CHEST LUNG CA SCREEN LOW DOSE W/O CM  Result Date: 05/22/2020 CLINICAL DATA:  Current smoker, 22 pack-year history. EXAM: CT CHEST WITHOUT CONTRAST LOW-DOSE FOR LUNG CANCER  SCREENING TECHNIQUE: Multidetector CT imaging of the chest was performed following the standard protocol without IV contrast. COMPARISON:  None. FINDINGS: Cardiovascular: Atherosclerotic calcification of the aorta. Heart is at the upper limits of normal in size. No pericardial effusion. Mediastinum/Nodes: Low right paratracheal lymph node measures 11 mm. AP window lymph node measures 15 mm. Hilar regions are difficult to evaluate without IV contrast. No axillary adenopathy. Esophagus is grossly unremarkable. Lungs/Pleura: Centrilobular and paraseptal emphysema. Spiculated and slightly cavitary nodule in the apical segment right upper lobe measures 21.7 mm (4/41). There are extensions to the adjacent visceral pleural. Other pulmonary nodules measure 4.4 mm or less in size. Calcified left lower lobe granuloma. Basilar predominant coarsened pulmonary parenchymal ground-glass, bronchiectasis and mucoid impaction with areas of peripheral and basilar sparing. No pleural fluid. Airway is unremarkable. Upper Abdomen: Visualized portions of the liver, gallbladder and right adrenal gland are unremarkable. Nodular thickening of  the lateral limb left adrenal gland. Visualized portions of the kidneys, spleen, pancreas, stomach and bowel are otherwise unremarkable. Musculoskeletal: Mild degenerative changes in the spine. No worrisome lytic or sclerotic lesions. IMPRESSION: 1. Spiculated 21.7 mm apical segment right upper lobe nodule, highly worrisome for adenocarcinoma. Lung-RADS 4X, highly suspicious. Additional imaging evaluation or consultation with Pulmonology or Thoracic Surgery recommended. These results will be called to the ordering clinician or representative by the Radiologist Assistant, and communication documented in the PACS or [employer]. 2. Enlarged low right paratracheal and AP window lymph nodes, worrisome for metastatic disease. Enlarged mediastinal lymph nodes can also be seen in the setting of  interstitial lung disease. 3. Lower lung zone predominant coarsened pulmonary parenchymal ground-glass and bronchiectasis, findings which can be seen with interstitial lung disease such as chronic hypersensitivity pneumonitis or nonspecific interstitial pneumonitis. 4.  Aortic atherosclerosis (ICD10-I70.0). 5.  Emphysema (ICD10-J43.9). Electronically Signed   By: Lorin Picket M.D.   On: 05/22/2020 15:53   CT Super D Chest Wo Contrast  Result Date: 06/02/2020 CLINICAL DATA:  History of lung nodules in a 70 year old male. EXAM: CT CHEST WITHOUT CONTRAST TECHNIQUE: Multidetector CT imaging of the chest was performed using thin slice collimation for electromagnetic bronchoscopy planning purposes, without intravenous contrast. COMPARISON:  May 22, 2020 FINDINGS: Cardiovascular: Normal caliber of the thoracic aorta. Scattered aortic atherosclerosis. Heart size normal without pericardial effusion. Limited assessment of cardiovascular structures given lack of intravenous contrast. Mediastinum/Nodes: Thoracic inlet structures are normal. No axillary lymphadenopathy. Scattered mediastinal lymph nodes show no change, see PET evaluation of the same date for further detail. AP window lymph node is the largest lymph node in the chest measuring approximately 14 mm short axis within 1 mm of previous measurement. No subcarinal adenopathy. Mildly enlarged RIGHT paratracheal lymph nodes. No gross hilar lymphadenopathy. Lungs/Pleura: Stable spiculated partially cavitary RIGHT upper lobe pulmonary nodule unchanged since recent imaging measuring approximately 2.2 x 2.0 cm. Bandlike changes extend to the periphery of the RIGHT upper lobe. Signs of pulmonary emphysema. Ground-glass and septal thickening with signs of bronchiectasis with basilar predominance. No signs of honeycombing. Paraseptal emphysema at the lung apices. No effusion. No consolidation. Upper Abdomen: Incidental imaging of upper abdominal contents without acute  process. Musculoskeletal: Signs of cervical and upper thoracic anterior spinal fusion and discectomy with similar appearance, incompletely imaged. IMPRESSION: 1. Stable spiculated partially cavitary RIGHT upper lobe pulmonary nodule unchanged since recent imaging. Findings remain concerning for primary bronchogenic neoplasm. Referral to multi disciplinary thoracic oncologic setting is suggested if not yet performed. Findings remain associated with mildly enlarged mediastinal lymph nodes. Refer to separate PET interpretation for further detail. 2. Signs of pulmonary emphysema and interstitial lung disease. 3. Emphysema and aortic atherosclerosis. Aortic Atherosclerosis (ICD10-I70.0) and Emphysema (ICD10-J43.9). Electronically Signed   By: Zetta Bills M.D.   On: 06/02/2020 15:52      Assessment and Plan:      70 year old man with:  1.  Prostate cancer diagnosed in April 2022.  He was found to have a PSA of 8.7 and a Gleason score 4+ 5 = in 2 cores.  He has Gleason score 8 and 7 and other cores.  Staging work-up clinic PET CT scan and CT scan of the chest did not show any evidence of metastatic disease.  His case was discussed today in the prostate cancer multidisciplinary clinic including review of his pathology results as well as his imaging studies discussion with radiology.  He has presented with high risk of prostate  cancer although disease that still very much treatable and likely curable at this time.  Treatment options were reviewed including primary surgical therapy versus radiation therapy with androgen depravation.  Given his concomitant malignancy and overall age and presentation it is reasonable to consider radiation and androgen deprivation therapy as a definitive modality.  The role for hormone therapy in the duration was discussed today in detail.  Androgen deprivation for 18 to 24 months would be reasonable concomitantly with radiation.  Complication clinic weight gain, hot flashes,  osteoporosis and sexual dysfunction were discussed as a potential complications.  He will also have discussion with Dr. Alinda Money regarding surgery as an alternative options.  I recommended proceeding with androgen deprivation therapy in the near future and upon completing his lung cancer treatment he can proceed with radiation to the prostate.   2.  Lung cancer: He presented with stage IIIa squamous cell carcinoma.  He will undergo definitive therapy with radiation and carboplatin and paclitaxel.   3.  Prognosis and risk of metastasis: He is dealing with a too high risk treatable malignancy.  The risk of relapse is high for both of them with different pattern.  Bone metastasis and pelvic adenopathy is more consistent with prostate cancer.  Recurrence within the lung possible brain metastasis could be attributed is primary lung neoplasm.   4.  I am happy to see him in the future as needed from a medical oncology standpoint.  60  minutes were dedicated to this visit. The time was spent on reviewing pathology results, imaging studies, discussing treatment options, and answering questions regarding future plan.      A copy of this consult has been forwarded to the requesting physician.

## 2020-06-20 NOTE — Telephone Encounter (Signed)
CALLED PATIENT TO INFORM OF ADT  APPT. ON 07-04-20 - ARRIVAL TIME- 10:15 AM @ DR. HERRICK'S OFFICE, LVM FOR A RETURN CALL

## 2020-06-22 ENCOUNTER — Ambulatory Visit (HOSPITAL_COMMUNITY)
Admission: RE | Admit: 2020-06-22 | Discharge: 2020-06-22 | Disposition: A | Discharge status: Home / Self Care | Payer: Medicare Other | Source: Ambulatory Visit | Attending: Internal Medicine | Admitting: Internal Medicine

## 2020-06-22 ENCOUNTER — Encounter: Payer: Self-pay | Admitting: General Practice

## 2020-06-22 DIAGNOSIS — C3490 Malignant neoplasm of unspecified part of unspecified bronchus or lung: Secondary | ICD-10-CM

## 2020-06-22 MED ORDER — GADOBUTROL 1 MMOL/ML IV SOLN
9.5000 mL | Freq: Once | INTRAVENOUS | Status: AC | PRN
  Administered 2020-06-22: 9.5 mL via INTRAVENOUS

## 2020-06-22 NOTE — Progress Notes (Signed)
[facility] Psychosocial Distress Screening Spiritual Care  Left voicemail for Jarrin following Prostate Multidisciplinary Clinic to introduce [facility] team/resources, reviewing distress screen per protocol.  The patient scored a 3 on the Psychosocial Distress Thermometer which indicates mild distress. Also assessed for distress and other psychosocial needs.   ONCBCN DISTRESS SCREENING 06/22/2020  Screening Type Initial Screening  Distress experienced in past week (1-10) 3  Referral to support programs Yes   Reached out to Mr Dismore by phone following Prostate Clinic to introduce Spiritual Care and support programming resources. Left voicemail encouraging return call.   Follow up needed: Yes.   Given distress of two types of cancer, plan to follow up in infusion if he does not return call.   [place], [facility] Pager [phone] Voicemail [phone]

## 2020-06-23 ENCOUNTER — Other Ambulatory Visit: Payer: Self-pay | Admitting: Urology

## 2020-06-23 DIAGNOSIS — C61 Malignant neoplasm of prostate: Secondary | ICD-10-CM

## 2020-06-27 ENCOUNTER — Encounter: Payer: Self-pay | Admitting: Medical Oncology

## 2020-06-27 ENCOUNTER — Other Ambulatory Visit: Payer: Self-pay

## 2020-06-27 ENCOUNTER — Inpatient Hospital Stay: Payer: Medicare Other

## 2020-06-27 ENCOUNTER — Ambulatory Visit
Admission: RE | Admit: 2020-06-27 | Discharge: 2020-06-27 | Disposition: A | Discharge status: Home / Self Care | Payer: Medicare Other | Source: Ambulatory Visit | Attending: Radiation Oncology | Admitting: Radiation Oncology

## 2020-06-27 DIAGNOSIS — C61 Malignant neoplasm of prostate: Secondary | ICD-10-CM | POA: Diagnosis not present

## 2020-06-27 DIAGNOSIS — C3411 Malignant neoplasm of upper lobe, right bronchus or lung: Secondary | ICD-10-CM | POA: Diagnosis not present

## 2020-06-27 DIAGNOSIS — C3491 Malignant neoplasm of unspecified part of right bronchus or lung: Secondary | ICD-10-CM

## 2020-06-27 LAB — CULTURE, FUNGUS WITHOUT SMEAR

## 2020-06-27 NOTE — Progress Notes (Signed)
  Radiation Oncology         [id number]) [phone] ________________________________  Name: Jason [name]. MRN: [id number]  Date: 06/27/2020  DOB: 1950/04/07  SIMULATION AND TREATMENT PLANNING NOTE    ICD-10-CM   1. 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  Identity was confirmed.  All relevant records and images related to the planned course of therapy were reviewed.  The patient freely provided informed written consent to proceed with treatment after reviewing the details related to the planned course of therapy. The consent form was witnessed and verified by the simulation staff.  Then, the patient was set-up in a stable reproducible  supine position for radiation therapy.  CT images were obtained.  Surface markings were placed.  The CT images were loaded into the planning software.  Then the target and avoidance structures were contoured.  Treatment planning then occurred.  The radiation prescription was entered and confirmed.  Then, I designed and supervised the construction of a total of 6 medically necessary complex treatment devices, including a BodyFix immobilization mold custom fitted to the patient along with 5 multileaf collimators conformally shaped radiation around the treatment target while shielding critical structures such as the heart and spinal cord maximally.  I have requested : 3D Simulation  I have requested a DVH of the following structures: Left lung, right lung, spinal cord, heart, esophagus, and target.  I have ordered:Nutrition Consult  SPECIAL TREATMENT PROCEDURE:  The planned course of therapy using radiation constitutes a special treatment procedure. Special care is required in the management of this patient for the following reasons.  The patient will be  receiving concurrent chemotherapy requiring careful monitoring for increased toxicities of treatment including periodic laboratory values.  The special nature of the planned course of radiotherapy will require increased physician supervision and oversight to ensure patient's safety with optimal treatment outcomes.  PLAN:  The patient will receive 66 Gy in 33 fractions for his lung cancer.  His prostate cancer will be treated now with ADT and later with IMRT.  ________________________________  Sheral Apley. Tammi Klippel, M.D.

## 2020-06-28 ENCOUNTER — Encounter: Payer: Self-pay | Admitting: *Deleted

## 2020-06-28 NOTE — Progress Notes (Signed)
I followed up on Jason [name] treatment plan schedule. His chemo and XRT are set up at this time.

## 2020-06-29 DIAGNOSIS — C3411 Malignant neoplasm of upper lobe, right bronchus or lung: Secondary | ICD-10-CM | POA: Insufficient documentation

## 2020-06-29 DIAGNOSIS — C61 Malignant neoplasm of prostate: Secondary | ICD-10-CM | POA: Insufficient documentation

## 2020-06-30 MED FILL — Dexamethasone Sodium Phosphate Inj 100 MG/10ML: INTRAMUSCULAR | Qty: 2 | Status: AC

## 2020-07-03 ENCOUNTER — Inpatient Hospital Stay: Payer: Medicare Other

## 2020-07-03 ENCOUNTER — Encounter: Payer: Self-pay | Admitting: General Practice

## 2020-07-03 ENCOUNTER — Other Ambulatory Visit: Payer: Self-pay

## 2020-07-03 ENCOUNTER — Ambulatory Visit
Admission: RE | Admit: 2020-07-03 | Discharge: 2020-07-03 | Disposition: A | Discharge status: Home / Self Care | Payer: Medicare Other | Source: Ambulatory Visit | Attending: Radiation Oncology | Admitting: Radiation Oncology

## 2020-07-03 ENCOUNTER — Encounter: Payer: Self-pay | Admitting: Internal Medicine

## 2020-07-03 VITALS — BP 105/75 | HR 63 | Temp 98.2°F | Resp 17

## 2020-07-03 DIAGNOSIS — I129 Hypertensive chronic kidney disease with stage 1 through stage 4 chronic kidney disease, or unspecified chronic kidney disease: Secondary | ICD-10-CM | POA: Insufficient documentation

## 2020-07-03 DIAGNOSIS — M19071 Primary osteoarthritis, right ankle and foot: Secondary | ICD-10-CM | POA: Insufficient documentation

## 2020-07-03 DIAGNOSIS — R590 Localized enlarged lymph nodes: Secondary | ICD-10-CM | POA: Insufficient documentation

## 2020-07-03 DIAGNOSIS — C3491 Malignant neoplasm of unspecified part of right bronchus or lung: Secondary | ICD-10-CM

## 2020-07-03 DIAGNOSIS — R5383 Other fatigue: Secondary | ICD-10-CM | POA: Insufficient documentation

## 2020-07-03 DIAGNOSIS — C61 Malignant neoplasm of prostate: Secondary | ICD-10-CM | POA: Diagnosis not present

## 2020-07-03 DIAGNOSIS — Z79899 Other long term (current) drug therapy: Secondary | ICD-10-CM | POA: Insufficient documentation

## 2020-07-03 DIAGNOSIS — C3411 Malignant neoplasm of upper lobe, right bronchus or lung: Secondary | ICD-10-CM | POA: Insufficient documentation

## 2020-07-03 DIAGNOSIS — Z5111 Encounter for antineoplastic chemotherapy: Secondary | ICD-10-CM | POA: Insufficient documentation

## 2020-07-03 DIAGNOSIS — C3490 Malignant neoplasm of unspecified part of unspecified bronchus or lung: Secondary | ICD-10-CM

## 2020-07-03 DIAGNOSIS — N183 Chronic kidney disease, stage 3 unspecified: Secondary | ICD-10-CM | POA: Insufficient documentation

## 2020-07-03 DIAGNOSIS — Z923 Personal history of irradiation: Secondary | ICD-10-CM | POA: Insufficient documentation

## 2020-07-03 LAB — CMP (CANCER CENTER ONLY)
ALT: 9 U/L (ref 0–44)
AST: 11 U/L — ABNORMAL LOW (ref 15–41)
Albumin: 3.5 g/dL (ref 3.5–5.0)
Alkaline Phosphatase: 97 U/L (ref 38–126)
Anion gap: 11 (ref 5–15)
BUN: 27 mg/dL — ABNORMAL HIGH (ref 8–23)
CO2: 17 mmol/L — ABNORMAL LOW (ref 22–32)
Calcium: 9.4 mg/dL (ref 8.9–10.3)
Chloride: 114 mmol/L — ABNORMAL HIGH (ref 98–111)
Creatinine: 1.91 mg/dL — ABNORMAL HIGH (ref 0.61–1.24)
GFR, Estimated: 37 mL/min — ABNORMAL LOW (ref >60)
Glucose, Bld: 103 mg/dL — ABNORMAL HIGH (ref 70–99)
Potassium: 3.9 mmol/L (ref 3.5–5.1)
Sodium: 142 mmol/L (ref 135–145)
Total Bilirubin: 0.4 mg/dL (ref 0.3–1.2)
Total Protein: 7.2 g/dL (ref 6.5–8.1)

## 2020-07-03 LAB — CBC WITH DIFFERENTIAL (CANCER CENTER ONLY)
Abs Immature Granulocytes: 0 10*3/uL (ref 0.00–0.07)
Basophils Absolute: 0 10*3/uL (ref 0.0–0.1)
Basophils Relative: 1 %
Eosinophils Absolute: 0.4 10*3/uL (ref 0.0–0.5)
Eosinophils Relative: 7 %
HCT: 38.3 % — ABNORMAL LOW (ref 39.0–52.0)
Hemoglobin: 12.3 g/dL — ABNORMAL LOW (ref 13.0–17.0)
Immature Granulocytes: 0 %
Lymphocytes Relative: 41 %
Lymphs Abs: 2.3 10*3/uL (ref 0.7–4.0)
MCH: 28.8 pg (ref 26.0–34.0)
MCHC: 32.1 g/dL (ref 30.0–36.0)
MCV: 89.7 fL (ref 80.0–100.0)
Monocytes Absolute: 0.6 10*3/uL (ref 0.1–1.0)
Monocytes Relative: 11 %
Neutro Abs: 2.3 10*3/uL (ref 1.7–7.7)
Neutrophils Relative %: 40 %
Platelet Count: 257 10*3/uL (ref 150–400)
RBC: 4.27 MIL/uL (ref 4.22–5.81)
RDW: 15.1 % (ref 11.5–15.5)
WBC Count: 5.6 10*3/uL (ref 4.0–10.5)
nRBC: 0 % (ref 0.0–0.2)

## 2020-07-03 MED ORDER — SODIUM CHLORIDE 0.9 % IV SOLN
45.0000 mg/m2 | Freq: Once | INTRAVENOUS | Status: AC
  Administered 2020-07-03: 96 mg via INTRAVENOUS
  Filled 2020-07-03: qty 16

## 2020-07-03 MED ORDER — DIPHENHYDRAMINE HCL 50 MG/ML IJ SOLN
50.0000 mg | Freq: Once | INTRAMUSCULAR | Status: AC
  Administered 2020-07-03: 50 mg via INTRAVENOUS

## 2020-07-03 MED ORDER — SODIUM CHLORIDE 0.9 % IV SOLN
140.0000 mg | Freq: Once | INTRAVENOUS | Status: AC
  Administered 2020-07-03: 140 mg via INTRAVENOUS
  Filled 2020-07-03: qty 14

## 2020-07-03 MED ORDER — SODIUM CHLORIDE 0.9 % IV SOLN
Freq: Once | INTRAVENOUS | Status: AC
  Filled 2020-07-03: qty 250

## 2020-07-03 MED ORDER — SODIUM CHLORIDE 0.9 % IV SOLN
20.0000 mg | Freq: Once | INTRAVENOUS | Status: AC
  Administered 2020-07-03: 20 mg via INTRAVENOUS
  Filled 2020-07-03: qty 20

## 2020-07-03 MED ORDER — PALONOSETRON HCL INJECTION 0.25 MG/5ML
0.2500 mg | Freq: Once | INTRAVENOUS | Status: AC
  Administered 2020-07-03: 0.25 mg via INTRAVENOUS

## 2020-07-03 MED ORDER — FAMOTIDINE 20 MG IN NS 100 ML IVPB
INTRAVENOUS | Status: AC
  Filled 2020-07-03: qty 100

## 2020-07-03 MED ORDER — PALONOSETRON HCL INJECTION 0.25 MG/5ML
INTRAVENOUS | Status: AC
  Filled 2020-07-03: qty 5

## 2020-07-03 MED ORDER — DIPHENHYDRAMINE HCL 50 MG/ML IJ SOLN
INTRAMUSCULAR | Status: AC
  Filled 2020-07-03: qty 1

## 2020-07-03 MED ORDER — FAMOTIDINE 20 MG IN NS 100 ML IVPB
20.0000 mg | Freq: Once | INTRAVENOUS | Status: AC
Start: 2020-07-03 — End: 2020-07-03
  Administered 2020-07-03: 20 mg via INTRAVENOUS

## 2020-07-03 NOTE — Progress Notes (Signed)
Ok to treat with elevated Scr per Dr. Burr Medico

## 2020-07-03 NOTE — Progress Notes (Signed)
[facility] Spiritual Care Note  Met Mr Fecteau in infusion to ensure that he is aware of lung and prostate support groups, as well as ongoing chaplain availability. He reports good support from his wife and daughters, meaning and encouragement through prayer, and enjoyment through golf. Mr Saric welcomes follow-up check-ins in infusion and knows how to contact chaplain in interim as needed/desired.   Chaplain Lisa Lundeen, MDiv, [facility] Pager [phone] Voicemail [phone] 

## 2020-07-03 NOTE — Patient Instructions (Signed)
[facility]  Discharge Instructions: Thank you for choosing [facility] to provide your oncology and hematology care.   If you have a lab appointment with the [facility], please go directly to the [facility] and check in at the registration area.   Wear comfortable clothing and clothing appropriate for easy access to any Portacath or PICC line.   We strive to give you quality time with your provider. You may need to reschedule your appointment if you arrive late (15 or more minutes).  Arriving late affects you and other patients whose appointments are after yours.  Also, if you miss three or more appointments without notifying the office, you may be dismissed from the clinic at the provider's discretion.      For prescription refill requests, have your pharmacy contact our office and allow 72 hours for refills to be completed.    Today you received the following chemotherapy and/or immunotherapy agents: Taxol and carboplatin.   To help prevent nausea and vomiting after your treatment, we encourage you to take your nausea medication as directed.  BELOW ARE SYMPTOMS THAT SHOULD BE REPORTED IMMEDIATELY: . *FEVER GREATER THAN 100.4 F (38 C) OR HIGHER . *CHILLS OR SWEATING . *NAUSEA AND VOMITING THAT IS NOT CONTROLLED WITH YOUR NAUSEA MEDICATION . *UNUSUAL SHORTNESS OF BREATH . *UNUSUAL BRUISING OR BLEEDING . *URINARY PROBLEMS (pain or burning when urinating, or frequent urination) . *BOWEL PROBLEMS (unusual diarrhea, constipation, pain near the anus) . TENDERNESS IN MOUTH AND THROAT WITH OR WITHOUT PRESENCE OF ULCERS (sore throat, sores in mouth, or a toothache) . UNUSUAL RASH, SWELLING OR PAIN  . UNUSUAL VAGINAL DISCHARGE OR ITCHING   Items with * indicate a potential emergency and should be followed up as soon as possible or go to the Emergency Department if any problems should occur.  Please show the CHEMOTHERAPY ALERT CARD or  IMMUNOTHERAPY ALERT CARD at check-in to the Emergency Department and triage nurse.  Should you have questions after your visit or need to cancel or reschedule your appointment, please contact [facility]  Dept: [phone]  and follow the prompts.  Office hours are 8:00 a.m. to 4:30 p.m. Monday - Friday. Please note that voicemails left after 4:00 p.m. may not be returned until the following business day.  We are closed weekends and major holidays. You have access to a nurse at all times for urgent questions. Please call the main number to the clinic Dept: [phone] and follow the prompts.   For any non-urgent questions, you may also contact your provider using MyChart. We now offer e-Visits for anyone 37 and older to request care online for non-urgent symptoms. For details visit [email or web address].   Also download the MyChart app! Go to the app store, search "MyChart", open the app, select [facility], and log in with your MyChart username and password.  Due to Covid, a mask is required upon entering the hospital/clinic. If you do not have a mask, one will be given to you upon arrival. For doctor visits, patients may have 1 support person aged 70 or older with them. For treatment visits, patients cannot have anyone with them due to current Covid guidelines and our immunocompromised population.   Paclitaxel injection What is this medicine? PACLITAXEL (PAK li TAX el) is a chemotherapy drug. It targets fast dividing cells, like cancer cells, and causes these cells to die. This medicine is used to treat ovarian cancer, breast cancer, lung  cancer, Kaposi's sarcoma, and other cancers. This medicine may be used for other purposes; ask your health care provider or pharmacist if you have questions. COMMON BRAND NAME(S): Onxol, Taxol What should I tell my health care provider before I take this medicine? They need to know if you have any of these conditions:  history  of irregular heartbeat  liver disease  low blood counts, like low white cell, platelet, or red cell counts  lung or breathing disease, like asthma  tingling of the fingers or toes, or other nerve disorder  an unusual or allergic reaction to paclitaxel, alcohol, polyoxyethylated castor oil, other chemotherapy, other medicines, foods, dyes, or preservatives  pregnant or trying to get pregnant  breast-feeding How should I use this medicine? This drug is given as an infusion into a vein. It is administered in a hospital or clinic by a specially trained health care professional. Talk to your pediatrician regarding the use of this medicine in children. Special care may be needed. Overdosage: If you think you have taken too much of this medicine contact a poison control center or emergency room at once. NOTE: This medicine is only for you. Do not share this medicine with others. What if I miss a dose? It is important not to miss your dose. Call your doctor or health care professional if you are unable to keep an appointment. What may interact with this medicine? Do not take this medicine with any of the following medications:  live virus vaccines This medicine may also interact with the following medications:  antiviral medicines for hepatitis, HIV or AIDS  certain antibiotics like erythromycin and clarithromycin  certain medicines for fungal infections like ketoconazole and itraconazole  certain medicines for seizures like carbamazepine, phenobarbital, phenytoin  gemfibrozil  nefazodone  rifampin  St. John's wort This list may not describe all possible interactions. Give your health care provider a list of all the medicines, herbs, non-prescription drugs, or dietary supplements you use. Also tell them if you smoke, drink alcohol, or use illegal drugs. Some items may interact with your medicine. What should I watch for while using this medicine? Your condition will be monitored  carefully while you are receiving this medicine. You will need important blood work done while you are taking this medicine. This medicine can cause serious allergic reactions. To reduce your risk you will need to take other medicine(s) before treatment with this medicine. If you experience allergic reactions like skin rash, itching or hives, swelling of the face, lips, or tongue, tell your doctor or health care professional right away. In some cases, you may be given additional medicines to help with side effects. Follow all directions for their use. This drug may make you feel generally unwell. This is not uncommon, as chemotherapy can affect healthy cells as well as cancer cells. Report any side effects. Continue your course of treatment even though you feel ill unless your doctor tells you to stop. Call your doctor or health care professional for advice if you get a fever, chills or sore throat, or other symptoms of a cold or flu. Do not treat yourself. This drug decreases your body's ability to fight infections. Try to avoid being around people who are sick. This medicine may increase your risk to bruise or bleed. Call your doctor or health care professional if you notice any unusual bleeding. Be careful brushing and flossing your teeth or using a toothpick because you may get an infection or bleed more easily. If you have  any dental work done, tell your dentist you are receiving this medicine. Avoid taking products that contain aspirin, acetaminophen, ibuprofen, naproxen, or ketoprofen unless instructed by your doctor. These medicines may hide a fever. Do not become pregnant while taking this medicine. Women should inform their doctor if they wish to become pregnant or think they might be pregnant. There is a potential for serious side effects to an unborn child. Talk to your health care professional or pharmacist for more information. Do not breast-feed an infant while taking this medicine. Men are  advised not to father a child while receiving this medicine. This product may contain alcohol. Ask your pharmacist or healthcare provider if this medicine contains alcohol. Be sure to tell all healthcare providers you are taking this medicine. Certain medicines, like metronidazole and disulfiram, can cause an unpleasant reaction when taken with alcohol. The reaction includes flushing, headache, nausea, vomiting, sweating, and increased thirst. The reaction can last from 30 minutes to several hours. What side effects may I notice from receiving this medicine? Side effects that you should report to your doctor or health care professional as soon as possible:  allergic reactions like skin rash, itching or hives, swelling of the face, lips, or tongue  breathing problems  changes in vision  fast, irregular heartbeat  high or low blood pressure  mouth sores  pain, tingling, numbness in the hands or feet  signs of decreased platelets or bleeding - bruising, pinpoint red spots on the skin, black, tarry stools, blood in the urine  signs of decreased red blood cells - unusually weak or tired, feeling faint or lightheaded, falls  signs of infection - fever or chills, cough, sore throat, pain or difficulty passing urine  signs and symptoms of liver injury like dark yellow or brown urine; general ill feeling or flu-like symptoms; light-colored stools; loss of appetite; nausea; right upper belly pain; unusually weak or tired; yellowing of the eyes or skin  swelling of the ankles, feet, hands  unusually slow heartbeat Side effects that usually do not require medical attention (report to your doctor or health care professional if they continue or are bothersome):  diarrhea  hair loss  loss of appetite  muscle or joint pain  nausea, vomiting  pain, redness, or irritation at site where injected  tiredness This list may not describe all possible side effects. Call your doctor for medical  advice about side effects. You may report side effects to FDA at 1-800-FDA-1088. Where should I keep my medicine? This drug is given in a hospital or clinic and will not be stored at home. NOTE: This sheet is a summary. It may not cover all possible information. If you have questions about this medicine, talk to your doctor, pharmacist, or health care provider.  2021 Elsevier/Gold Standard (2018-12-16 13:37:23)  Carboplatin injection What is this medicine? CARBOPLATIN (KAR boe pla tin) is a chemotherapy drug. It targets fast dividing cells, like cancer cells, and causes these cells to die. This medicine is used to treat ovarian cancer and many other cancers. This medicine may be used for other purposes; ask your health care provider or pharmacist if you have questions. COMMON BRAND NAME(S): Paraplatin What should I tell my health care provider before I take this medicine? They need to know if you have any of these conditions:  blood disorders  hearing problems  kidney disease  recent or ongoing radiation therapy  an unusual or allergic reaction to carboplatin, cisplatin, other chemotherapy, other medicines, foods, dyes,  or preservatives  pregnant or trying to get pregnant  breast-feeding How should I use this medicine? This drug is usually given as an infusion into a vein. It is administered in a hospital or clinic by a specially trained health care professional. Talk to your pediatrician regarding the use of this medicine in children. Special care may be needed. Overdosage: If you think you have taken too much of this medicine contact a poison control center or emergency room at once. NOTE: This medicine is only for you. Do not share this medicine with others. What if I miss a dose? It is important not to miss a dose. Call your doctor or health care professional if you are unable to keep an appointment. What may interact with this medicine?  medicines for seizures  medicines  to increase blood counts like filgrastim, pegfilgrastim, sargramostim  some antibiotics like amikacin, gentamicin, neomycin, streptomycin, tobramycin  vaccines Talk to your doctor or health care professional before taking any of these medicines:  acetaminophen  aspirin  ibuprofen  ketoprofen  naproxen This list may not describe all possible interactions. Give your health care provider a list of all the medicines, herbs, non-prescription drugs, or dietary supplements you use. Also tell them if you smoke, drink alcohol, or use illegal drugs. Some items may interact with your medicine. What should I watch for while using this medicine? Your condition will be monitored carefully while you are receiving this medicine. You will need important blood work done while you are taking this medicine. This drug may make you feel generally unwell. This is not uncommon, as chemotherapy can affect healthy cells as well as cancer cells. Report any side effects. Continue your course of treatment even though you feel ill unless your doctor tells you to stop. In some cases, you may be given additional medicines to help with side effects. Follow all directions for their use. Call your doctor or health care professional for advice if you get a fever, chills or sore throat, or other symptoms of a cold or flu. Do not treat yourself. This drug decreases your body's ability to fight infections. Try to avoid being around people who are sick. This medicine may increase your risk to bruise or bleed. Call your doctor or health care professional if you notice any unusual bleeding. Be careful brushing and flossing your teeth or using a toothpick because you may get an infection or bleed more easily. If you have any dental work done, tell your dentist you are receiving this medicine. Avoid taking products that contain aspirin, acetaminophen, ibuprofen, naproxen, or ketoprofen unless instructed by your doctor. These medicines  may hide a fever. Do not become pregnant while taking this medicine. Women should inform their doctor if they wish to become pregnant or think they might be pregnant. There is a potential for serious side effects to an unborn child. Talk to your health care professional or pharmacist for more information. Do not breast-feed an infant while taking this medicine. What side effects may I notice from receiving this medicine? Side effects that you should report to your doctor or health care professional as soon as possible:  allergic reactions like skin rash, itching or hives, swelling of the face, lips, or tongue  signs of infection - fever or chills, cough, sore throat, pain or difficulty passing urine  signs of decreased platelets or bleeding - bruising, pinpoint red spots on the skin, black, tarry stools, nosebleeds  signs of decreased red blood cells - unusually weak  or tired, fainting spells, lightheadedness  breathing problems  changes in hearing  changes in vision  chest pain  high blood pressure  low blood counts - This drug may decrease the number of white blood cells, red blood cells and platelets. You may be at increased risk for infections and bleeding.  nausea and vomiting  pain, swelling, redness or irritation at the injection site  pain, tingling, numbness in the hands or feet  problems with balance, talking, walking  trouble passing urine or change in the amount of urine Side effects that usually do not require medical attention (report to your doctor or health care professional if they continue or are bothersome):  hair loss  loss of appetite  metallic taste in the mouth or changes in taste This list may not describe all possible side effects. Call your doctor for medical advice about side effects. You may report side effects to FDA at 1-800-FDA-1088. Where should I keep my medicine? This drug is given in a hospital or clinic and will not be stored at  home. NOTE: This sheet is a summary. It may not cover all possible information. If you have questions about this medicine, talk to your doctor, pharmacist, or health care provider.  2021 Elsevier/Gold Standard (2007-04-21 14:38:05)

## 2020-07-03 NOTE — Progress Notes (Signed)
Met w/ pt to introduce myself as his Arboriculturist.  Pt has 2 insurances so copay assistance shouldn't be needed.  I offered the [place] and went over what it covers but pt declined the grant at this time.  I gave him my card in case he changes his mind and for any questions or concerns he may have in the future.

## 2020-07-04 ENCOUNTER — Encounter: Payer: Self-pay | Admitting: Medical Oncology

## 2020-07-04 ENCOUNTER — Telehealth: Payer: Self-pay | Admitting: *Deleted

## 2020-07-04 ENCOUNTER — Ambulatory Visit
Admission: RE | Admit: 2020-07-04 | Discharge: 2020-07-04 | Disposition: A | Discharge status: Home / Self Care | Payer: Medicare Other | Source: Ambulatory Visit | Attending: Radiation Oncology | Admitting: Radiation Oncology

## 2020-07-04 DIAGNOSIS — C61 Malignant neoplasm of prostate: Secondary | ICD-10-CM | POA: Diagnosis not present

## 2020-07-04 NOTE — Telephone Encounter (Signed)
Called pt to see how he did with his treatment & he reports doing well with no c/o's.  He slept well last night. He reports knowing reasons to call & how to reach [place].

## 2020-07-04 NOTE — Telephone Encounter (Signed)
CALLED PATIENT TO INFORM OF FID. MARKERS AND SPACE OAR PLACEMENT ON 08-30-20 @ [facility] AND HIS SIM ON 09-01-20 - ARRIVAL TIME- 9:45 AM @ [facility], SPOKE WITH PATIENT AND HE IS AWARE OF THESE APPTS.

## 2020-07-04 NOTE — Telephone Encounter (Signed)
-----   Message from Tildon Husky, RN sent at 07/03/2020  1:07 PM EDT ----- Regarding: first time treatment call Patient received [place] and taxol for the first time today. He is seen here by dr. Julien Nordmann. Treatment went great

## 2020-07-05 ENCOUNTER — Ambulatory Visit
Admission: RE | Admit: 2020-07-05 | Discharge: 2020-07-05 | Disposition: A | Discharge status: Home / Self Care | Payer: Medicare Other | Source: Ambulatory Visit | Attending: Radiation Oncology | Admitting: Radiation Oncology

## 2020-07-05 ENCOUNTER — Other Ambulatory Visit: Payer: Self-pay

## 2020-07-05 DIAGNOSIS — C61 Malignant neoplasm of prostate: Secondary | ICD-10-CM | POA: Diagnosis not present

## 2020-07-05 NOTE — Progress Notes (Signed)
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  PRIOR THERAPY: None  CURRENT THERAPY: concurrent chemoradiation with weekly carboplatin for AUC of 2 and paclitaxel 45 Mg/M2. Status post 1 cycle. First dose on 07/04/20.   INTERVAL HISTORY: Jason [name]. 70 y.o. male returns to the clinic today for a follow-up visit.  The patient is feeling well today without any concerning complaints. The patient was recently diagnosed with 2 high risk malignancies including stage III lung cancer and prostate adenocarcinoma.  The plan is to complete his initial treatment for his lung cancer followed by androgen deprivation and possibly radiation for the prostate cancer.  Regarding his lung cancer, the patient started his weekly chemotherapy with carboplatin and paclitaxel last week and he tolerated it well.  He denies any recent fever, chills, night sweats, or unexplained weight loss.  He denies any chest pain, shortness of breath, cough, or hemoptysis.  He denies any nausea, vomiting, diarrhea, or constipation.  He denies any headache or visual changes.  The patient is here today for evaluation before starting cycle #2.      MEDICAL HISTORY: Past Medical History:  Diagnosis Date   Arthritis    back   Hypertension    Prostate cancer ([place])    Tobacco use     ALLERGIES:  has No Known Allergies.  MEDICATIONS:  Current Outpatient Medications  Medication Sig Dispense Refill   acetaminophen (TYLENOL) 325 MG tablet Take 650 mg by mouth every 6 (six) hours as needed. (Patient not taking: Reported on 06/20/2020)      atorvastatin (LIPITOR) 10 MG tablet Take 10 mg by mouth daily. (Patient not taking: Reported on 06/20/2020)     azelastine (ASTELIN) 0.1 % nasal spray Place 1 spray into both nostrils 2 (two) times daily as needed for rhinitis. Use in each nostril as directed     fluticasone (FLONASE) 50 MCG/ACT nasal spray Place 2 sprays into both nostrils daily.     ketoconazole (NIZORAL) 2 % shampoo  (Patient not taking: Reported on 06/20/2020)     levocetirizine (XYZAL) 5 MG tablet Take 5 mg by mouth every evening.     montelukast (SINGULAIR) 10 MG tablet Take 10 mg by mouth at bedtime.     prochlorperazine (COMPAZINE) 10 MG tablet Take 1 tablet (10 mg total) by mouth every 6 (six) hours as needed for nausea or vomiting. (Patient not taking: Reported on 06/20/2020) 30 tablet 0   valsartan-hydrochlorothiazide (DIOVAN-HCT) 320-25 MG tablet Take 1 tablet by mouth daily.     vitamin B-12 (CYANOCOBALAMIN) 500 MCG tablet Take 500 mcg by mouth daily. (Patient not taking: Reported on 06/20/2020)     No current facility-administered medications for this visit.    SURGICAL HISTORY:  Past Surgical History:  Procedure Laterality Date   BACK SURGERY     BRONCHIAL BIOPSY  06/06/2020   Procedure: BRONCHIAL BIOPSIES;  Surgeon: Garner Nash, DO;  Location: [facility];  Service: Pulmonary;;   BRONCHIAL BRUSHINGS  06/06/2020   Procedure: BRONCHIAL BRUSHINGS;  Surgeon: Garner Nash, DO;  Location: [facility];  Service: Pulmonary;;   BRONCHIAL NEEDLE ASPIRATION BIOPSY  06/06/2020  Procedure: BRONCHIAL NEEDLE ASPIRATION BIOPSIES;  Surgeon: Garner Nash, DO;  Location: [facility];  Service: Pulmonary;;   BRONCHIAL WASHINGS  06/06/2020   Procedure: BRONCHIAL WASHINGS;  Surgeon: Garner Nash, DO;  Location: [facility];  Service: Pulmonary;;   COLONOSCOPY     OTHER SURGICAL HISTORY     fluid drawn off his testicle   PROSTATE BIOPSY     ROTATOR CUFF REPAIR Right    VIDEO BRONCHOSCOPY WITH ENDOBRONCHIAL  NAVIGATION Right 06/06/2020   Procedure: VIDEO BRONCHOSCOPY WITH ENDOBRONCHIAL NAVIGATION;  Surgeon: Garner Nash, DO;  Location: [facility];  Service: Pulmonary;  Laterality: Right;   VIDEO BRONCHOSCOPY WITH ENDOBRONCHIAL ULTRASOUND Bilateral 06/06/2020   Procedure: VIDEO BRONCHOSCOPY WITH ENDOBRONCHIAL ULTRASOUND;  Surgeon: Garner Nash, DO;  Location: [facility];  Service: Pulmonary;  Laterality: Bilateral;   WRIST SURGERY      REVIEW OF SYSTEMS:   Review of Systems  Constitutional: Negative for appetite change, chills, fatigue, fever and unexpected weight change.  HENT: Negative for mouth sores, nosebleeds, sore throat and trouble swallowing.   Eyes: Negative for eye problems and icterus.  Respiratory: Negative for cough, hemoptysis, shortness of breath and wheezing.   Cardiovascular: Negative for chest pain and leg swelling.  Gastrointestinal: Negative for abdominal pain, constipation, diarrhea, nausea and vomiting.  Genitourinary: Negative for bladder incontinence, difficulty urinating, dysuria, frequency and hematuria.   Musculoskeletal: Negative for back pain, gait problem, neck pain and neck stiffness.  Skin: Negative for itching and rash.  Neurological: Negative for dizziness, extremity weakness, gait problem, headaches, light-headedness and seizures.  Hematological: Negative for adenopathy. Does not bruise/bleed easily.  Psychiatric/Behavioral: Negative for confusion, depression and sleep disturbance. The patient is not nervous/anxious.     PHYSICAL EXAMINATION:  Blood pressure 111/76, pulse 78, temperature 98 F (36.7 C), temperature source Oral, resp. rate 17, weight 194 lb 9.6 oz (88.3 kg), SpO2 99 %.  ECOG PERFORMANCE STATUS: 1  Physical Exam  Constitutional: Oriented to person, place, and time and well-developed, well-nourished, and in no distress.  HENT:  Head: Normocephalic and atraumatic.  Mouth/Throat: Oropharynx is clear and moist. No oropharyngeal  exudate.  Eyes: Conjunctivae are normal. Right eye exhibits no discharge. Left eye exhibits no discharge. No scleral icterus.  Neck: Normal range of motion. Neck supple.  Cardiovascular: Normal rate, regular rhythm, normal heart sounds and intact distal pulses.   Pulmonary/Chest: Effort normal and breath sounds normal. No respiratory distress. No wheezes. No rales.  Abdominal: Soft. Bowel sounds are normal. Exhibits no distension and no mass. There is no tenderness.  Musculoskeletal: Normal range of motion. Exhibits no edema.  Lymphadenopathy:    No cervical adenopathy.  Neurological: Alert and oriented to person, place, and time. Exhibits normal muscle tone. Gait normal. Coordination normal.  Skin: Skin is warm and dry. No rash noted. Not diaphoretic. No erythema. No pallor.  Psychiatric: Mood, memory and judgment normal.  Vitals reviewed.  LABORATORY DATA: Lab Results  Component Value Date   WBC 5.9 07/10/2020   HGB 11.4 (L) 07/10/2020   HCT 35.2 (L) 07/10/2020   MCV 88.9 07/10/2020   PLT 255 07/10/2020      Chemistry      Component Value Date/Time   NA 138 07/10/2020 0901   K 3.9 07/10/2020 0901   CL 109 07/10/2020 0901   CO2 21 (L) 07/10/2020 0901   BUN 27 (H) 07/10/2020 0901   CREATININE 2.22 (H) 07/10/2020 0901      Component Value Date/Time   CALCIUM  9.1 07/10/2020 0901   ALKPHOS 85 07/10/2020 0901   AST 12 (L) 07/10/2020 0901   ALT 10 07/10/2020 0901   BILITOT 0.4 07/10/2020 0901       RADIOGRAPHIC STUDIES:  MR BRAIN W WO CONTRAST  Result Date: 06/23/2020 CLINICAL DATA:  Non-small lung cancer, staging EXAM: MRI HEAD WITHOUT AND WITH CONTRAST TECHNIQUE: Multiplanar, multiecho pulse sequences of the brain and surrounding structures were obtained without and with intravenous contrast. CONTRAST:  9.34mL GADAVIST GADOBUTROL 1 MMOL/ML IV SOLN COMPARISON:  None. FINDINGS: Brain: Ventricle size and cerebral volume normal for age. Scattered small white matter  hyperintensities bilaterally, mild. Mild hyperintensity in the pons bilaterally. Negative for acute infarct. Negative for hemorrhage Negative for metastatic disease.  No enhancing lesions in the brain. Vascular: Normal arterial flow voids. Skull and upper cervical spine: No focal skeletal lesion. Sinuses/Orbits: Paranasal sinuses clear.  Negative orbit Other: None IMPRESSION: Negative for metastatic disease Mild chronic microvascular ischemia. Electronically Signed   By: Franchot Gallo M.D.   On: 06/23/2020 14:52      ASSESSMENT/PLAN:  This is a very pleasant 70 year old African-American male recently diagnosed with stage IIIa/C (T1c, N2-N3, M0) non-small cell lung cancer, squamous cell carcinoma.  He presented with a right upper lobe lung nodule in addition to subcarinal lymphadenopathy and suspicious AP window lymph node involvement.  He was diagnosed in May 2022.  Also diagnosed with prostate adenocarcinoma with a Gleason score of 9.  He was diagnosed in April 2022.  The patient is currently undergoing concurrent chemoradiation with carboplatin for an AUC of 2 and paclitaxel 45 mg per metered squared.  He is status post 1 cycle and tolerated it well.   Labs reviewed.  Recommend that he proceed with cycle #2 today scheduled. The patient has CKD and is followed by nephrology. He is alright to proceed with treatment today with his labs.   We will see him back for follow-up visit in 2 weeks for evaluation before starting cycle #4.  The patient will continue to follow with urology and radiation regarding management of his prostate cancer after completion of concurrent chemoradiation.  The patient was advised to call immediately if he has any concerning symptoms in the interval. The patient voices understanding of current disease status and treatment options and is in agreement with the current care plan. All questions were answered. The patient knows to call the clinic with any problems, questions or  concerns. We can certainly see the patient much sooner if necessary      No orders of the defined types were placed in this encounter.     The total time spent in the appointment was 20-29 minutes  Haydin Dunn L Quadir Muns, PA-C 07/10/20

## 2020-07-06 ENCOUNTER — Encounter: Payer: Self-pay | Admitting: Internal Medicine

## 2020-07-06 ENCOUNTER — Ambulatory Visit
Admission: RE | Admit: 2020-07-06 | Discharge: 2020-07-06 | Disposition: A | Discharge status: Home / Self Care | Payer: Medicare Other | Source: Ambulatory Visit | Attending: Radiation Oncology | Admitting: Radiation Oncology

## 2020-07-06 DIAGNOSIS — C61 Malignant neoplasm of prostate: Secondary | ICD-10-CM | POA: Diagnosis not present

## 2020-07-07 ENCOUNTER — Other Ambulatory Visit: Payer: Self-pay

## 2020-07-07 ENCOUNTER — Ambulatory Visit
Admission: RE | Admit: 2020-07-07 | Discharge: 2020-07-07 | Disposition: A | Discharge status: Home / Self Care | Payer: Medicare Other | Source: Ambulatory Visit | Attending: Radiation Oncology | Admitting: Radiation Oncology

## 2020-07-07 DIAGNOSIS — C61 Malignant neoplasm of prostate: Secondary | ICD-10-CM | POA: Diagnosis not present

## 2020-07-10 ENCOUNTER — Ambulatory Visit
Admission: RE | Admit: 2020-07-10 | Discharge: 2020-07-10 | Disposition: A | Discharge status: Home / Self Care | Payer: Medicare Other | Source: Ambulatory Visit | Attending: Radiation Oncology | Admitting: Radiation Oncology

## 2020-07-10 ENCOUNTER — Inpatient Hospital Stay: Payer: Medicare Other

## 2020-07-10 ENCOUNTER — Inpatient Hospital Stay (HOSPITAL_BASED_OUTPATIENT_CLINIC_OR_DEPARTMENT_OTHER): Payer: Medicare Other | Admitting: Physician Assistant

## 2020-07-10 ENCOUNTER — Encounter: Payer: Self-pay | Admitting: Internal Medicine

## 2020-07-10 ENCOUNTER — Other Ambulatory Visit: Payer: Self-pay

## 2020-07-10 VITALS — BP 111/76 | HR 78 | Temp 98.0°F | Resp 17 | Wt 194.6 lb

## 2020-07-10 DIAGNOSIS — C3491 Malignant neoplasm of unspecified part of right bronchus or lung: Secondary | ICD-10-CM | POA: Diagnosis not present

## 2020-07-10 DIAGNOSIS — C61 Malignant neoplasm of prostate: Secondary | ICD-10-CM | POA: Diagnosis not present

## 2020-07-10 DIAGNOSIS — C3490 Malignant neoplasm of unspecified part of unspecified bronchus or lung: Secondary | ICD-10-CM

## 2020-07-10 DIAGNOSIS — Z5111 Encounter for antineoplastic chemotherapy: Secondary | ICD-10-CM

## 2020-07-10 LAB — CBC WITH DIFFERENTIAL (CANCER CENTER ONLY)
Abs Immature Granulocytes: 0.03 10*3/uL (ref 0.00–0.07)
Basophils Absolute: 0 10*3/uL (ref 0.0–0.1)
Basophils Relative: 1 %
Eosinophils Absolute: 0.2 10*3/uL (ref 0.0–0.5)
Eosinophils Relative: 4 %
HCT: 35.2 % — ABNORMAL LOW (ref 39.0–52.0)
Hemoglobin: 11.4 g/dL — ABNORMAL LOW (ref 13.0–17.0)
Immature Granulocytes: 1 %
Lymphocytes Relative: 34 %
Lymphs Abs: 2 10*3/uL (ref 0.7–4.0)
MCH: 28.8 pg (ref 26.0–34.0)
MCHC: 32.4 g/dL (ref 30.0–36.0)
MCV: 88.9 fL (ref 80.0–100.0)
Monocytes Absolute: 0.6 10*3/uL (ref 0.1–1.0)
Monocytes Relative: 10 %
Neutro Abs: 3 10*3/uL (ref 1.7–7.7)
Neutrophils Relative %: 50 %
Platelet Count: 255 10*3/uL (ref 150–400)
RBC: 3.96 MIL/uL — ABNORMAL LOW (ref 4.22–5.81)
RDW: 14.9 % (ref 11.5–15.5)
WBC Count: 5.9 10*3/uL (ref 4.0–10.5)
nRBC: 0 % (ref 0.0–0.2)

## 2020-07-10 LAB — CMP (CANCER CENTER ONLY)
ALT: 10 U/L (ref 0–44)
AST: 12 U/L — ABNORMAL LOW (ref 15–41)
Albumin: 3.2 g/dL — ABNORMAL LOW (ref 3.5–5.0)
Alkaline Phosphatase: 85 U/L (ref 38–126)
Anion gap: 8 (ref 5–15)
BUN: 27 mg/dL — ABNORMAL HIGH (ref 8–23)
CO2: 21 mmol/L — ABNORMAL LOW (ref 22–32)
Calcium: 9.1 mg/dL (ref 8.9–10.3)
Chloride: 109 mmol/L (ref 98–111)
Creatinine: 2.22 mg/dL — ABNORMAL HIGH (ref 0.61–1.24)
GFR, Estimated: 31 mL/min — ABNORMAL LOW (ref >60)
Glucose, Bld: 110 mg/dL — ABNORMAL HIGH (ref 70–99)
Potassium: 3.9 mmol/L (ref 3.5–5.1)
Sodium: 138 mmol/L (ref 135–145)
Total Bilirubin: 0.4 mg/dL (ref 0.3–1.2)
Total Protein: 6.7 g/dL (ref 6.5–8.1)

## 2020-07-10 MED ORDER — FAMOTIDINE 20 MG IN NS 100 ML IVPB
INTRAVENOUS | Status: AC
  Filled 2020-07-10: qty 100

## 2020-07-10 MED ORDER — SODIUM CHLORIDE 0.9 % IV SOLN
20.0000 mg | Freq: Once | INTRAVENOUS | Status: AC
  Administered 2020-07-10: 20 mg via INTRAVENOUS
  Filled 2020-07-10: qty 20

## 2020-07-10 MED ORDER — PALONOSETRON HCL INJECTION 0.25 MG/5ML
INTRAVENOUS | Status: AC
  Filled 2020-07-10: qty 5

## 2020-07-10 MED ORDER — SODIUM CHLORIDE 0.9 % IV SOLN
45.0000 mg/m2 | Freq: Once | INTRAVENOUS | Status: AC
  Administered 2020-07-10: 96 mg via INTRAVENOUS
  Filled 2020-07-10: qty 16

## 2020-07-10 MED ORDER — SODIUM CHLORIDE 0.9 % IV SOLN
140.0000 mg | Freq: Once | INTRAVENOUS | Status: AC
  Administered 2020-07-10: 140 mg via INTRAVENOUS
  Filled 2020-07-10: qty 14

## 2020-07-10 MED ORDER — PALONOSETRON HCL INJECTION 0.25 MG/5ML
0.2500 mg | Freq: Once | INTRAVENOUS | Status: AC
  Administered 2020-07-10: 0.25 mg via INTRAVENOUS

## 2020-07-10 MED ORDER — DIPHENHYDRAMINE HCL 50 MG/ML IJ SOLN
INTRAMUSCULAR | Status: AC
  Filled 2020-07-10: qty 1

## 2020-07-10 MED ORDER — SODIUM CHLORIDE 0.9 % IV SOLN
Freq: Once | INTRAVENOUS | Status: AC
Start: 2020-07-10 — End: 2020-07-10
  Filled 2020-07-10: qty 250

## 2020-07-10 MED ORDER — DIPHENHYDRAMINE HCL 50 MG/ML IJ SOLN
50.0000 mg | Freq: Once | INTRAMUSCULAR | Status: AC
  Administered 2020-07-10: 50 mg via INTRAVENOUS

## 2020-07-10 MED ORDER — FAMOTIDINE 20 MG IN NS 100 ML IVPB
20.0000 mg | Freq: Once | INTRAVENOUS | Status: AC
  Administered 2020-07-10: 20 mg via INTRAVENOUS

## 2020-07-10 NOTE — Patient Instructions (Signed)
[facility]  Discharge Instructions: Thank you for choosing [facility] to provide your oncology and hematology care.   If you have a lab appointment with the [facility], please go directly to the [facility] and check in at the registration area.   Wear comfortable clothing and clothing appropriate for easy access to any Portacath or PICC line.   We strive to give you quality time with your provider. You may need to reschedule your appointment if you arrive late (15 or more minutes).  Arriving late affects you and other patients whose appointments are after yours.  Also, if you miss three or more appointments without notifying the office, you may be dismissed from the clinic at the provider's discretion.      For prescription refill requests, have your pharmacy contact our office and allow 72 hours for refills to be completed.    Today you received the following chemotherapy and/or immunotherapy agents paclitaxel, carboplatin      To help prevent nausea and vomiting after your treatment, we encourage you to take your nausea medication as directed.  BELOW ARE SYMPTOMS THAT SHOULD BE REPORTED IMMEDIATELY: *FEVER GREATER THAN 100.4 F (38 C) OR HIGHER *CHILLS OR SWEATING *NAUSEA AND VOMITING THAT IS NOT CONTROLLED WITH YOUR NAUSEA MEDICATION *UNUSUAL SHORTNESS OF BREATH *UNUSUAL BRUISING OR BLEEDING *URINARY PROBLEMS (pain or burning when urinating, or frequent urination) *BOWEL PROBLEMS (unusual diarrhea, constipation, pain near the anus) TENDERNESS IN MOUTH AND THROAT WITH OR WITHOUT PRESENCE OF ULCERS (sore throat, sores in mouth, or a toothache) UNUSUAL RASH, SWELLING OR PAIN  UNUSUAL VAGINAL DISCHARGE OR ITCHING   Items with * indicate a potential emergency and should be followed up as soon as possible or go to the Emergency Department if any problems should occur.  Please show the CHEMOTHERAPY ALERT CARD or IMMUNOTHERAPY ALERT CARD at  check-in to the Emergency Department and triage nurse.  Should you have questions after your visit or need to cancel or reschedule your appointment, please contact [facility]  Dept: [phone]  and follow the prompts.  Office hours are 8:00 a.m. to 4:30 p.m. Monday - Friday. Please note that voicemails left after 4:00 p.m. may not be returned until the following business day.  We are closed weekends and major holidays. You have access to a nurse at all times for urgent questions. Please call the main number to the clinic Dept: [phone] and follow the prompts.   For any non-urgent questions, you may also contact your provider using MyChart. We now offer e-Visits for anyone 73 and older to request care online for non-urgent symptoms. For details visit [email or web address].   Also download the MyChart app! Go to the app store, search "MyChart", open the app, select [facility], and log in with your MyChart username and password.  Due to Covid, a mask is required upon entering the hospital/clinic. If you do not have a mask, one will be given to you upon arrival. For doctor visits, patients may have 1 support person aged 10 or older with them. For treatment visits, patients cannot have anyone with them due to current Covid guidelines and our immunocompromised population.

## 2020-07-11 ENCOUNTER — Ambulatory Visit
Admission: RE | Admit: 2020-07-11 | Discharge: 2020-07-11 | Disposition: A | Discharge status: Home / Self Care | Payer: Medicare Other | Source: Ambulatory Visit | Attending: Radiation Oncology | Admitting: Radiation Oncology

## 2020-07-11 DIAGNOSIS — C61 Malignant neoplasm of prostate: Secondary | ICD-10-CM | POA: Diagnosis not present

## 2020-07-12 ENCOUNTER — Encounter: Payer: Self-pay | Admitting: Pulmonary Disease

## 2020-07-12 ENCOUNTER — Encounter (HOSPITAL_COMMUNITY)
Admission: RE | Admit: 2020-07-12 | Discharge: 2020-07-12 | Disposition: A | Discharge status: Home / Self Care | Payer: Medicare Other | Source: Ambulatory Visit | Attending: Urology | Admitting: Urology

## 2020-07-12 ENCOUNTER — Other Ambulatory Visit: Payer: Self-pay

## 2020-07-12 ENCOUNTER — Ambulatory Visit (INDEPENDENT_AMBULATORY_CARE_PROVIDER_SITE_OTHER): Payer: Medicare Other | Admitting: Pulmonary Disease

## 2020-07-12 ENCOUNTER — Ambulatory Visit
Admission: RE | Admit: 2020-07-12 | Discharge: 2020-07-12 | Disposition: A | Discharge status: Home / Self Care | Payer: Medicare Other | Source: Ambulatory Visit | Attending: Radiation Oncology | Admitting: Radiation Oncology

## 2020-07-12 VITALS — BP 118/70 | HR 70 | Ht 70.0 in | Wt 197.0 lb

## 2020-07-12 DIAGNOSIS — R918 Other nonspecific abnormal finding of lung field: Secondary | ICD-10-CM | POA: Diagnosis not present

## 2020-07-12 DIAGNOSIS — J432 Centrilobular emphysema: Secondary | ICD-10-CM

## 2020-07-12 DIAGNOSIS — Z87891 Personal history of nicotine dependence: Secondary | ICD-10-CM

## 2020-07-12 DIAGNOSIS — R590 Localized enlarged lymph nodes: Secondary | ICD-10-CM

## 2020-07-12 DIAGNOSIS — C3491 Malignant neoplasm of unspecified part of right bronchus or lung: Secondary | ICD-10-CM | POA: Diagnosis not present

## 2020-07-12 DIAGNOSIS — R911 Solitary pulmonary nodule: Secondary | ICD-10-CM | POA: Diagnosis not present

## 2020-07-12 DIAGNOSIS — C61 Malignant neoplasm of prostate: Secondary | ICD-10-CM | POA: Insufficient documentation

## 2020-07-12 MED ORDER — TECHNETIUM TC 99M MEDRONATE IV KIT
18.7000 | PACK | Freq: Once | INTRAVENOUS | Status: AC | PRN
  Administered 2020-07-12: 18.7 via INTRAVENOUS

## 2020-07-12 NOTE — Patient Instructions (Signed)
Thank you for visiting Dr. Valeta Harms at [facility]. Today we recommend the following:  Return in about 4 months (around 11/11/2020), or if symptoms worsen or fail to improve, for with APP or Dr. Valeta Harms.    Please do your part to reduce the spread of COVID-19.

## 2020-07-12 NOTE — Progress Notes (Signed)
Synopsis: Referred in May 2022 for lung nodule by Lujean Amel, MD  Subjective:   PATIENT ID: Jason [name]. GENDER: male DOB: 02/19/1950, MRN: [id number]  Chief Complaint  Patient presents with   Follow-up    Lung nodule    70 yo M, PMH prostate cancer (recent dx),, tobacco abuse, smoker for 30 years.  History of back surgery.  No family history of lung cancer.  Patient was enrolled in our lung cancer screening program.  First lung cancer screening image was 05/22/2020 which revealed a right upper lobe 2.1 cm cavitary nodule with associated paraseptal emphysema and a low right paratracheal lymph node measuring 11 mm.  Concerning for a primary bronchogenic carcinoma.  Patient has also associated centrilobular emphysema.  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  Patient denies shortness of breath or weight loss.  He has had general anesthesia before for a back surgery.  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    OV 07/12/2020: Here today for follow-up after recent diagnosis of lung cancer.  Patient was taken for bronchoscopy on 06/06/2020.  Established care with radiation oncology as well as medical oncology.  Medical oncology office visit note from 07/10/2020 reviewed today.  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  Also has prostate adenocarcinoma Gleason score 9 diagnosed in April 2022.  Currently undergoing chemoradiation with carboplatinum plus paclitaxel.  Here today for follow-up with me after tissue diagnosis and initiation of treatment.   Oncology History  Non-small cell lung cancer, right ([place])  06/15/2020 Initial Diagnosis   Non-small cell lung cancer, right ([place])    06/15/2020 Cancer Staging   Staging form: Lung, AJCC 8th Edition - Clinical: Stage IIIA (cT1c, cN2, cM0) - Signed by Curt Bears, MD on 06/15/2020    07/03/2020 -  Chemotherapy     Patient is on Treatment Plan: LUNG CARBOPLATIN / PACLITAXEL + XRT Q7D       Malignant neoplasm of prostate ([place])  05/22/2020 Cancer Staging   Staging form: Prostate, AJCC 8th Edition - Clinical stage from 05/22/2020: Stage IIIC (cT1c, cN0, cM0, PSA: 10.6, Grade Group: 5) - Signed by Freeman Caldron, PA-C on 06/20/2020  Histopathologic type: Adenocarcinoma, NOS  Stage prefix: Initial diagnosis  Prostate specific antigen (PSA) range: 10 to 19  Gleason primary pattern: 4  Gleason secondary pattern: 5  Gleason score: 9  Histologic grading system: 5 grade system  Number of biopsy cores examined: 12  Number of biopsy cores positive: 7  Location of positive needle core biopsies: Both sides    06/16/2020 Initial Diagnosis   Malignant neoplasm of prostate ([place])    06/20/2020 Cancer Staging   Staging form: Prostate, AJCC 8th Edition - Pathologic: No stage assigned - Signed by Wyatt Portela, MD on 06/20/2020       Past Medical History:  Diagnosis Date   Arthritis    back   Hypertension    Prostate cancer ([place])    Tobacco use      Family History  Problem Relation Age of Onset   Heart failure Mother    Leukemia Father    Mesothelioma Brother      Past Surgical History:  Procedure Laterality Date   BACK SURGERY     BRONCHIAL BIOPSY  06/06/2020   Procedure: BRONCHIAL BIOPSIES;  Surgeon: Garner Nash, DO;  Location: [facility];  Service: Pulmonary;;  BRONCHIAL BRUSHINGS  06/06/2020   Procedure: BRONCHIAL BRUSHINGS;  Surgeon: Garner Nash, DO;  Location: [facility];  Service: Pulmonary;;   BRONCHIAL NEEDLE ASPIRATION BIOPSY  06/06/2020   Procedure: BRONCHIAL NEEDLE ASPIRATION BIOPSIES;  Surgeon: Garner Nash, DO;  Location: [facility];  Service: Pulmonary;;   BRONCHIAL WASHINGS  06/06/2020   Procedure: BRONCHIAL WASHINGS;  Surgeon: Garner Nash, DO;  Location: [facility];  Service: Pulmonary;;   COLONOSCOPY     OTHER SURGICAL HISTORY      fluid drawn off his testicle   PROSTATE BIOPSY     ROTATOR CUFF REPAIR Right    VIDEO BRONCHOSCOPY WITH ENDOBRONCHIAL NAVIGATION Right 06/06/2020   Procedure: VIDEO BRONCHOSCOPY WITH ENDOBRONCHIAL NAVIGATION;  Surgeon: Garner Nash, DO;  Location: [facility];  Service: Pulmonary;  Laterality: Right;   VIDEO BRONCHOSCOPY WITH ENDOBRONCHIAL ULTRASOUND Bilateral 06/06/2020   Procedure: VIDEO BRONCHOSCOPY WITH ENDOBRONCHIAL ULTRASOUND;  Surgeon: Garner Nash, DO;  Location: [facility];  Service: Pulmonary;  Laterality: Bilateral;   WRIST SURGERY      Social History   Socioeconomic History   Marital status: Married    Spouse name: Terisa   Number of children: 4   Years of education: Not on file   Highest education level: Not on file  Occupational History    Comment: [place] veteran   Occupation: Materials engineer Homicide    Comment: Retired from [employer]   Occupation: Financial controller    Comment: [place] Use   Smoking status: Former    Packs/day: 1.50    Years: 35.00    Pack years: 52.50    Types: Cigarettes    Start date: 1970    Quit date: 05/26/2020    Years since quitting: 0.1   Smokeless tobacco: Never  Vaping Use   Vaping Use: Never used  Substance and Sexual Activity   Alcohol use: Yes    Comment: rare   Drug use: Not Currently   Sexual activity: Not Currently  Other Topics Concern   Not on file  Social History Narrative   3 living children. One child passed of an aneurysm.    Social Determinants of Health   Financial Resource Strain: Not on file  Food Insecurity: Not on file  Transportation Needs: Not on file  Physical Activity: Not on file  Stress: Not on file  Social Connections: Not on file  Intimate Partner Violence: Not on file     No Known Allergies   Outpatient Medications Prior to Visit  Medication Sig Dispense Refill   azelastine (ASTELIN) 0.1 % nasal spray Place 1 spray into both nostrils 2 (two) times daily as needed for  rhinitis. Use in each nostril as directed     fluticasone (FLONASE) 50 MCG/ACT nasal spray Place 2 sprays into both nostrils daily.     levocetirizine (XYZAL) 5 MG tablet Take 5 mg by mouth every evening.     montelukast (SINGULAIR) 10 MG tablet Take 10 mg by mouth at bedtime.     valsartan-hydrochlorothiazide (DIOVAN-HCT) 320-25 MG tablet Take 1 tablet by mouth daily.     acetaminophen (TYLENOL) 325 MG tablet Take 650 mg by mouth every 6 (six) hours as needed. (Patient not taking: No sig reported)     atorvastatin (LIPITOR) 10 MG tablet Take 10 mg by mouth daily. (Patient not taking: No sig reported)     ketoconazole (NIZORAL) 2 % shampoo  (Patient not taking: No sig reported)     prochlorperazine (COMPAZINE)  10 MG tablet Take 1 tablet (10 mg total) by mouth every 6 (six) hours as needed for nausea or vomiting. (Patient not taking: No sig reported) 30 tablet 0   vitamin B-12 (CYANOCOBALAMIN) 500 MCG tablet Take 500 mcg by mouth daily. (Patient not taking: No sig reported)     No facility-administered medications prior to visit.    Review of Systems  Constitutional:  Negative for chills, fever, malaise/fatigue and weight loss.  HENT:  Negative for hearing loss, sore throat and tinnitus.   Eyes:  Negative for blurred vision and double vision.  Respiratory:  Negative for cough, hemoptysis, sputum production, shortness of breath, wheezing and stridor.   Cardiovascular:  Negative for chest pain, palpitations, orthopnea, leg swelling and PND.  Gastrointestinal:  Negative for abdominal pain, constipation, diarrhea, heartburn, nausea and vomiting.  Genitourinary:  Negative for dysuria, hematuria and urgency.  Musculoskeletal:  Negative for joint pain and myalgias.  Skin:  Negative for itching and rash.  Neurological:  Negative for dizziness, tingling, weakness and headaches.  Endo/Heme/Allergies:  Negative for environmental allergies. Does not bruise/bleed easily.  Psychiatric/Behavioral:   Negative for depression. The patient is not nervous/anxious and does not have insomnia.   All other systems reviewed and are negative.   Objective:  Physical Exam Vitals reviewed.  Constitutional:      General: He is not in acute distress.    Appearance: He is well-developed.  HENT:     Head: Normocephalic and atraumatic.  Eyes:     General: No scleral icterus.    Conjunctiva/sclera: Conjunctivae normal.     Pupils: Pupils are equal, round, and reactive to light.  Neck:     Vascular: No JVD.     Trachea: No tracheal deviation.  Cardiovascular:     Rate and Rhythm: Normal rate and regular rhythm.     Heart sounds: Normal heart sounds. No murmur heard. Pulmonary:     Effort: Pulmonary effort is normal. No tachypnea, accessory muscle usage or respiratory distress.     Breath sounds: No stridor. No wheezing, rhonchi or rales.  Abdominal:     General: Bowel sounds are normal. There is no distension.     Palpations: Abdomen is soft.     Tenderness: There is no abdominal tenderness.  Musculoskeletal:        General: No tenderness.     Cervical back: Neck supple.  Lymphadenopathy:     Cervical: No cervical adenopathy.  Skin:    General: Skin is warm and dry.     Capillary Refill: Capillary refill takes less than 2 seconds.     Findings: No rash.  Neurological:     Mental Status: He is alert and oriented to person, place, and time.  Psychiatric:        Behavior: Behavior normal.     Vitals:   07/12/20 1335  BP: 118/70  Pulse: 70  SpO2: 97%  Weight: 197 lb (89.4 kg)  Height: 5\' 10"  (1.778 m)   97% on RA BMI Readings from Last 3 Encounters:  07/12/20 28.27 kg/m  07/10/20 27.92 kg/m  06/20/20 27.71 kg/m   Wt Readings from Last 3 Encounters:  07/12/20 197 lb (89.4 kg)  07/10/20 194 lb 9.6 oz (88.3 kg)  06/20/20 193 lb 2 oz (87.6 kg)     CBC    Component Value Date/Time   WBC 5.9 07/10/2020 0901   RBC 3.96 (L) 07/10/2020 0901   HGB 11.4 (L) 07/10/2020 0901    HCT 35.2 (L)  07/10/2020 0901   PLT 255 07/10/2020 0901   MCV 88.9 07/10/2020 0901   MCH 28.8 07/10/2020 0901   MCHC 32.4 07/10/2020 0901   RDW 14.9 07/10/2020 0901   LYMPHSABS 2.0 07/10/2020 0901   MONOABS 0.6 07/10/2020 0901   EOSABS 0.2 07/10/2020 0901   BASOSABS 0.0 07/10/2020 0901    Chest Imaging: 05/22/2020: CT lung cancer screening 2.1 cm right upper lobe apical cavitary nodule concerning for primary bronchogenic carcinoma with associated mediastinal adenopathy concerning for an advanced stage disease. The patient's images have been independently reviewed by me.    Pulmonary Functions Testing Results: PFT Results Latest Ref Rng & Units 06/08/2020  FVC-Pre L 2.98  FVC-Predicted Pre % 87  FVC-Post L 2.93  FVC-Predicted Post % 86  Pre FEV1/FVC % % 82  Post FEV1/FCV % % 84  FEV1-Pre L 2.44  FEV1-Predicted Pre % 95  FEV1-Post L 2.46  DLCO uncorrected ml/min/mmHg 13.19  DLCO UNC% % 55  DLCO corrected ml/min/mmHg 13.19  DLCO COR %Predicted % 55  DLVA Predicted % 68  TLC L 5.07  TLC % Predicted % 79  RV % Predicted % 89    FeNO:   Pathology:   Echocardiogram:   Heart Catheterization:     Assessment & Plan:     ICD-10-CM   1. Non-small cell lung cancer, right (HCC)  C34.91     2. Lung nodule  R91.1     3. Lung mass  R91.8     4. Mediastinal adenopathy  R59.0     5. Former smoker  Z87.891     38. Centrilobular emphysema (HCC)  J43.2       Discussion:  70 year old gentleman, diagnosis of a 2 cm right upper lobe pulmonary nodule with associated mediastinal adenopathy.  Concern for advanced age bronchogenic carcinoma patient underwent navigational bronchoscopy with video bronchoscopy and endobronchial ultrasound and nodal staging.  Diagnosed with non-small cell carcinoma.  Plan: Continue follow-up with medical oncology and radiation oncology. Liver lung foundation Hope tote given to patient today. Patient overall breathing well at this time. If his  respiratory symptoms change patient was instructed to call and let us know. May benefit from as needed albuterol at some point. Currently not on any maintenance inhaler. Patient to follow-up with [place] in 4 months.    Current Outpatient Medications:    azelastine (ASTELIN) 0.1 % nasal spray, Place 1 spray into both nostrils 2 (two) times daily as needed for rhinitis. Use in each nostril as directed, Disp: , Rfl:    fluticasone (FLONASE) 50 MCG/ACT nasal spray, Place 2 sprays into both nostrils daily., Disp: , Rfl:    levocetirizine (XYZAL) 5 MG tablet, Take 5 mg by mouth every evening., Disp: , Rfl:    montelukast (SINGULAIR) 10 MG tablet, Take 10 mg by mouth at bedtime., Disp: , Rfl:    valsartan-hydrochlorothiazide (DIOVAN-HCT) 320-25 MG tablet, Take 1 tablet by mouth daily., Disp: , Rfl:    acetaminophen (TYLENOL) 325 MG tablet, Take 650 mg by mouth every 6 (six) hours as needed. (Patient not taking: No sig reported), Disp: , Rfl:    atorvastatin (LIPITOR) 10 MG tablet, Take 10 mg by mouth daily. (Patient not taking: No sig reported), Disp: , Rfl:    ketoconazole (NIZORAL) 2 % shampoo, , Disp: , Rfl:    prochlorperazine (COMPAZINE) 10 MG tablet, Take 1 tablet (10 mg total) by mouth every 6 (six) hours as needed for nausea or vomiting. (Patient not taking: No sig reported),  Disp: 30 tablet, Rfl: 0   vitamin B-12 (CYANOCOBALAMIN) 500 MCG tablet, Take 500 mcg by mouth daily. (Patient not taking: No sig reported), Disp: , Rfl:   I spent 21 minutes dedicated to the care of this patient on the date of this encounter to include pre-visit review of records, face-to-face time with the patient discussing conditions above, post visit ordering of testing, clinical documentation with the electronic health record, making appropriate referrals as documented, and communicating necessary findings to members of the patients care team.    Garner Nash, DO [facility] 07/12/2020 1:45 PM

## 2020-07-13 ENCOUNTER — Ambulatory Visit
Admission: RE | Admit: 2020-07-13 | Discharge: 2020-07-13 | Disposition: A | Discharge status: Home / Self Care | Payer: Medicare Other | Source: Ambulatory Visit | Attending: Radiation Oncology | Admitting: Radiation Oncology

## 2020-07-13 DIAGNOSIS — C61 Malignant neoplasm of prostate: Secondary | ICD-10-CM | POA: Diagnosis not present

## 2020-07-14 ENCOUNTER — Other Ambulatory Visit: Payer: Self-pay

## 2020-07-14 ENCOUNTER — Ambulatory Visit
Admission: RE | Admit: 2020-07-14 | Discharge: 2020-07-14 | Disposition: A | Discharge status: Home / Self Care | Payer: Medicare Other | Source: Ambulatory Visit | Attending: Radiation Oncology | Admitting: Radiation Oncology

## 2020-07-14 DIAGNOSIS — C61 Malignant neoplasm of prostate: Secondary | ICD-10-CM | POA: Diagnosis not present

## 2020-07-17 ENCOUNTER — Encounter: Payer: Self-pay | Admitting: General Practice

## 2020-07-17 ENCOUNTER — Inpatient Hospital Stay: Payer: Medicare Other

## 2020-07-17 ENCOUNTER — Other Ambulatory Visit: Payer: Self-pay

## 2020-07-17 ENCOUNTER — Ambulatory Visit
Admission: RE | Admit: 2020-07-17 | Discharge: 2020-07-17 | Disposition: A | Discharge status: Home / Self Care | Payer: Medicare Other | Source: Ambulatory Visit | Attending: Radiation Oncology | Admitting: Radiation Oncology

## 2020-07-17 VITALS — BP 111/74 | HR 73 | Temp 97.8°F | Resp 16 | Wt 194.5 lb

## 2020-07-17 DIAGNOSIS — C3491 Malignant neoplasm of unspecified part of right bronchus or lung: Secondary | ICD-10-CM

## 2020-07-17 DIAGNOSIS — C3490 Malignant neoplasm of unspecified part of unspecified bronchus or lung: Secondary | ICD-10-CM

## 2020-07-17 DIAGNOSIS — C61 Malignant neoplasm of prostate: Secondary | ICD-10-CM | POA: Diagnosis not present

## 2020-07-17 LAB — CMP (CANCER CENTER ONLY)
ALT: 19 U/L (ref 0–44)
AST: 17 U/L (ref 15–41)
Albumin: 3.7 g/dL (ref 3.5–5.0)
Alkaline Phosphatase: 80 U/L (ref 38–126)
Anion gap: 7 (ref 5–15)
BUN: 31 mg/dL — ABNORMAL HIGH (ref 8–23)
CO2: 22 mmol/L (ref 22–32)
Calcium: 8.9 mg/dL (ref 8.9–10.3)
Chloride: 108 mmol/L (ref 98–111)
Creatinine: 1.77 mg/dL — ABNORMAL HIGH (ref 0.61–1.24)
GFR, Estimated: 41 mL/min — ABNORMAL LOW (ref >60)
Glucose, Bld: 94 mg/dL (ref 70–99)
Potassium: 4.2 mmol/L (ref 3.5–5.1)
Sodium: 137 mmol/L (ref 135–145)
Total Bilirubin: 0.3 mg/dL (ref 0.3–1.2)
Total Protein: 6.8 g/dL (ref 6.5–8.1)

## 2020-07-17 LAB — CBC WITH DIFFERENTIAL (CANCER CENTER ONLY)
Abs Immature Granulocytes: 0.01 10*3/uL (ref 0.00–0.07)
Basophils Absolute: 0 10*3/uL (ref 0.0–0.1)
Basophils Relative: 1 %
Eosinophils Absolute: 0.2 10*3/uL (ref 0.0–0.5)
Eosinophils Relative: 4 %
HCT: 33.7 % — ABNORMAL LOW (ref 39.0–52.0)
Hemoglobin: 10.9 g/dL — ABNORMAL LOW (ref 13.0–17.0)
Immature Granulocytes: 0 %
Lymphocytes Relative: 29 %
Lymphs Abs: 1.2 10*3/uL (ref 0.7–4.0)
MCH: 28.8 pg (ref 26.0–34.0)
MCHC: 32.3 g/dL (ref 30.0–36.0)
MCV: 88.9 fL (ref 80.0–100.0)
Monocytes Absolute: 0.6 10*3/uL (ref 0.1–1.0)
Monocytes Relative: 14 %
Neutro Abs: 2.2 10*3/uL (ref 1.7–7.7)
Neutrophils Relative %: 52 %
Platelet Count: 246 10*3/uL (ref 150–400)
RBC: 3.79 MIL/uL — ABNORMAL LOW (ref 4.22–5.81)
RDW: 14.7 % (ref 11.5–15.5)
WBC Count: 4.1 10*3/uL (ref 4.0–10.5)
nRBC: 0 % (ref 0.0–0.2)

## 2020-07-17 MED ORDER — FAMOTIDINE 20 MG IN NS 100 ML IVPB
20.0000 mg | Freq: Once | INTRAVENOUS | Status: AC
  Administered 2020-07-17: 20 mg via INTRAVENOUS

## 2020-07-17 MED ORDER — SODIUM CHLORIDE 0.9 % IV SOLN
45.0000 mg/m2 | Freq: Once | INTRAVENOUS | Status: AC
  Administered 2020-07-17: 96 mg via INTRAVENOUS
  Filled 2020-07-17: qty 16

## 2020-07-17 MED ORDER — DIPHENHYDRAMINE HCL 50 MG/ML IJ SOLN
50.0000 mg | Freq: Once | INTRAMUSCULAR | Status: AC
  Administered 2020-07-17: 50 mg via INTRAVENOUS

## 2020-07-17 MED ORDER — PALONOSETRON HCL INJECTION 0.25 MG/5ML
INTRAVENOUS | Status: AC
  Filled 2020-07-17: qty 5

## 2020-07-17 MED ORDER — DIPHENHYDRAMINE HCL 50 MG/ML IJ SOLN
INTRAMUSCULAR | Status: AC
  Filled 2020-07-17: qty 1

## 2020-07-17 MED ORDER — SODIUM CHLORIDE 0.9 % IV SOLN
20.0000 mg | Freq: Once | INTRAVENOUS | Status: AC
  Administered 2020-07-17: 20 mg via INTRAVENOUS
  Filled 2020-07-17: qty 20

## 2020-07-17 MED ORDER — SODIUM CHLORIDE 0.9 % IV SOLN
140.0000 mg | Freq: Once | INTRAVENOUS | Status: AC
  Administered 2020-07-17: 140 mg via INTRAVENOUS
  Filled 2020-07-17: qty 14

## 2020-07-17 MED ORDER — PALONOSETRON HCL INJECTION 0.25 MG/5ML
0.2500 mg | Freq: Once | INTRAVENOUS | Status: AC
  Administered 2020-07-17: 0.25 mg via INTRAVENOUS

## 2020-07-17 MED ORDER — SODIUM CHLORIDE 0.9 % IV SOLN
Freq: Once | INTRAVENOUS | Status: AC
  Filled 2020-07-17: qty 250

## 2020-07-17 MED ORDER — FAMOTIDINE 20 MG IN NS 100 ML IVPB
INTRAVENOUS | Status: AC
  Filled 2020-07-17: qty 100

## 2020-07-17 NOTE — Progress Notes (Signed)
Per Dr. Julien Nordmann, ok to treat with elevated creatinine 1.77.

## 2020-07-17 NOTE — Patient Instructions (Signed)
[facility]   Discharge Instructions: Thank you for choosing [facility] to provide your oncology and hematology care.   If you have a lab appointment with the Cancer Center, please go directly to the Cancer Center and check in at the registration area.   Wear comfortable clothing and clothing appropriate for easy access to any Portacath or PICC line.   We strive to give you quality time with your provider. You may need to reschedule your appointment if you arrive late (15 or more minutes).  Arriving late affects you and other patients whose appointments are after yours.  Also, if you miss three or more appointments without notifying the office, you may be dismissed from the clinic at the provider's discretion.      For prescription refill requests, have your pharmacy contact our office and allow 72 hours for refills to be completed.    Today you received the following chemotherapy and/or immunotherapy agents: paclitaxel and carboplatin.      To help prevent nausea and vomiting after your treatment, we encourage you to take your nausea medication as directed.  BELOW ARE SYMPTOMS THAT SHOULD BE REPORTED IMMEDIATELY: *FEVER GREATER THAN 100.4 F (38 C) OR HIGHER *CHILLS OR SWEATING *NAUSEA AND VOMITING THAT IS NOT CONTROLLED WITH YOUR NAUSEA MEDICATION *UNUSUAL SHORTNESS OF BREATH *UNUSUAL BRUISING OR BLEEDING *URINARY PROBLEMS (pain or burning when urinating, or frequent urination) *BOWEL PROBLEMS (unusual diarrhea, constipation, pain near the anus) TENDERNESS IN MOUTH AND THROAT WITH OR WITHOUT PRESENCE OF ULCERS (sore throat, sores in mouth, or a toothache) UNUSUAL RASH, SWELLING OR PAIN  UNUSUAL VAGINAL DISCHARGE OR ITCHING   Items with * indicate a potential emergency and should be followed up as soon as possible or go to the Emergency Department if any problems should occur.  Please show the CHEMOTHERAPY ALERT CARD or IMMUNOTHERAPY ALERT  CARD at check-in to the Emergency Department and triage nurse.  Should you have questions after your visit or need to cancel or reschedule your appointment, please contact [facility]  Dept: [phone]  and follow the prompts.  Office hours are 8:00 a.m. to 4:30 p.m. Monday - Friday. Please note that voicemails left after 4:00 p.m. may not be returned until the following business day.  We are closed weekends and major holidays. You have access to a nurse at all times for urgent questions. Please call the main number to the clinic Dept: [phone] and follow the prompts.   For any non-urgent questions, you may also contact your provider using MyChart. We now offer e-Visits for anyone 18 and older to request care online for non-urgent symptoms. For details visit [email or web address].   Also download the MyChart app! Go to the app store, search "MyChart", open the app, select [facility], and log in with your MyChart username and password.  Due to Covid, a mask is required upon entering the hospital/clinic. If you do not have a mask, one will be given to you upon arrival. For doctor visits, patients may have 1 support person aged 18 or older with them. For treatment visits, patients cannot have anyone with them due to current Covid guidelines and our immunocompromised population.   

## 2020-07-17 NOTE — Progress Notes (Signed)
[facility] Spiritual Care Note  Made follow-up pastoral visit in infusion per Mr [facility] request. He reports good support from his wife and siblings (who look up to him as a father figure because he is the eldest and their dad died when they were young). Golf has been limited by hot weather, but he was in good spirits and reports doing well overall, especially because he is not suffering side effects from treatment. Provided reflective listening, emotional support, and affirmation of strengths. We plan to follow up at a future treatment.   [place], [place], [facility] Pager [phone] Voicemail [phone]

## 2020-07-18 ENCOUNTER — Ambulatory Visit
Admission: RE | Admit: 2020-07-18 | Discharge: 2020-07-18 | Disposition: A | Discharge status: Home / Self Care | Payer: Medicare Other | Source: Ambulatory Visit | Attending: Radiation Oncology | Admitting: Radiation Oncology

## 2020-07-18 DIAGNOSIS — C61 Malignant neoplasm of prostate: Secondary | ICD-10-CM | POA: Diagnosis not present

## 2020-07-19 ENCOUNTER — Ambulatory Visit
Admission: RE | Admit: 2020-07-19 | Discharge: 2020-07-19 | Disposition: A | Discharge status: Home / Self Care | Payer: Medicare Other | Source: Ambulatory Visit | Attending: Radiation Oncology | Admitting: Radiation Oncology

## 2020-07-19 ENCOUNTER — Other Ambulatory Visit: Payer: Self-pay

## 2020-07-19 DIAGNOSIS — C61 Malignant neoplasm of prostate: Secondary | ICD-10-CM | POA: Diagnosis not present

## 2020-07-20 ENCOUNTER — Ambulatory Visit
Admission: RE | Admit: 2020-07-20 | Discharge: 2020-07-20 | Disposition: A | Discharge status: Home / Self Care | Payer: Medicare Other | Source: Ambulatory Visit | Attending: Radiation Oncology | Admitting: Radiation Oncology

## 2020-07-20 DIAGNOSIS — C61 Malignant neoplasm of prostate: Secondary | ICD-10-CM | POA: Diagnosis not present

## 2020-07-20 LAB — ACID FAST CULTURE WITH REFLEXED SENSITIVITIES (MYCOBACTERIA): Acid Fast Culture: NEGATIVE

## 2020-07-21 ENCOUNTER — Other Ambulatory Visit: Payer: Self-pay

## 2020-07-21 ENCOUNTER — Ambulatory Visit
Admission: RE | Admit: 2020-07-21 | Discharge: 2020-07-21 | Disposition: A | Discharge status: Home / Self Care | Payer: Medicare Other | Source: Ambulatory Visit | Attending: Radiation Oncology | Admitting: Radiation Oncology

## 2020-07-21 DIAGNOSIS — C61 Malignant neoplasm of prostate: Secondary | ICD-10-CM | POA: Diagnosis not present

## 2020-07-24 ENCOUNTER — Inpatient Hospital Stay (HOSPITAL_BASED_OUTPATIENT_CLINIC_OR_DEPARTMENT_OTHER): Payer: Medicare Other | Admitting: Internal Medicine

## 2020-07-24 ENCOUNTER — Ambulatory Visit
Admission: RE | Admit: 2020-07-24 | Discharge: 2020-07-24 | Disposition: A | Discharge status: Home / Self Care | Payer: Medicare Other | Source: Ambulatory Visit | Attending: Radiation Oncology | Admitting: Radiation Oncology

## 2020-07-24 ENCOUNTER — Encounter: Payer: Self-pay | Admitting: Internal Medicine

## 2020-07-24 ENCOUNTER — Encounter: Payer: Self-pay | Admitting: General Practice

## 2020-07-24 ENCOUNTER — Other Ambulatory Visit: Payer: Self-pay

## 2020-07-24 ENCOUNTER — Inpatient Hospital Stay: Payer: Medicare Other

## 2020-07-24 VITALS — BP 122/72 | HR 68 | Temp 97.2°F | Resp 18 | Ht 70.0 in | Wt 189.1 lb

## 2020-07-24 DIAGNOSIS — Z5111 Encounter for antineoplastic chemotherapy: Secondary | ICD-10-CM

## 2020-07-24 DIAGNOSIS — C3491 Malignant neoplasm of unspecified part of right bronchus or lung: Secondary | ICD-10-CM | POA: Diagnosis not present

## 2020-07-24 DIAGNOSIS — I10 Essential (primary) hypertension: Secondary | ICD-10-CM | POA: Diagnosis not present

## 2020-07-24 DIAGNOSIS — C61 Malignant neoplasm of prostate: Secondary | ICD-10-CM | POA: Diagnosis not present

## 2020-07-24 DIAGNOSIS — C3490 Malignant neoplasm of unspecified part of unspecified bronchus or lung: Secondary | ICD-10-CM

## 2020-07-24 LAB — CBC WITH DIFFERENTIAL (CANCER CENTER ONLY)
Abs Immature Granulocytes: 0.01 10*3/uL (ref 0.00–0.07)
Basophils Absolute: 0 10*3/uL (ref 0.0–0.1)
Basophils Relative: 1 %
Eosinophils Absolute: 0.1 10*3/uL (ref 0.0–0.5)
Eosinophils Relative: 3 %
HCT: 34.8 % — ABNORMAL LOW (ref 39.0–52.0)
Hemoglobin: 11.3 g/dL — ABNORMAL LOW (ref 13.0–17.0)
Immature Granulocytes: 0 %
Lymphocytes Relative: 22 %
Lymphs Abs: 0.9 10*3/uL (ref 0.7–4.0)
MCH: 29 pg (ref 26.0–34.0)
MCHC: 32.5 g/dL (ref 30.0–36.0)
MCV: 89.5 fL (ref 80.0–100.0)
Monocytes Absolute: 0.6 10*3/uL (ref 0.1–1.0)
Monocytes Relative: 15 %
Neutro Abs: 2.3 10*3/uL (ref 1.7–7.7)
Neutrophils Relative %: 59 %
Platelet Count: 239 10*3/uL (ref 150–400)
RBC: 3.89 MIL/uL — ABNORMAL LOW (ref 4.22–5.81)
RDW: 15.1 % (ref 11.5–15.5)
WBC Count: 4 10*3/uL (ref 4.0–10.5)
nRBC: 0 % (ref 0.0–0.2)

## 2020-07-24 LAB — CMP (CANCER CENTER ONLY)
ALT: 13 U/L (ref 0–44)
AST: 13 U/L — ABNORMAL LOW (ref 15–41)
Albumin: 3.3 g/dL — ABNORMAL LOW (ref 3.5–5.0)
Alkaline Phosphatase: 84 U/L (ref 38–126)
Anion gap: 10 (ref 5–15)
BUN: 39 mg/dL — ABNORMAL HIGH (ref 8–23)
CO2: 19 mmol/L — ABNORMAL LOW (ref 22–32)
Calcium: 9.1 mg/dL (ref 8.9–10.3)
Chloride: 110 mmol/L (ref 98–111)
Creatinine: 2.25 mg/dL — ABNORMAL HIGH (ref 0.61–1.24)
GFR, Estimated: 30 mL/min — ABNORMAL LOW (ref >60)
Glucose, Bld: 91 mg/dL (ref 70–99)
Potassium: 4.6 mmol/L (ref 3.5–5.1)
Sodium: 139 mmol/L (ref 135–145)
Total Bilirubin: 0.4 mg/dL (ref 0.3–1.2)
Total Protein: 7 g/dL (ref 6.5–8.1)

## 2020-07-24 MED ORDER — PALONOSETRON HCL INJECTION 0.25 MG/5ML
INTRAVENOUS | Status: AC
  Filled 2020-07-24: qty 5

## 2020-07-24 MED ORDER — FAMOTIDINE 20 MG IN NS 100 ML IVPB
INTRAVENOUS | Status: AC
  Filled 2020-07-24: qty 100

## 2020-07-24 MED ORDER — DIPHENHYDRAMINE HCL 50 MG/ML IJ SOLN
50.0000 mg | Freq: Once | INTRAMUSCULAR | Status: AC
  Administered 2020-07-24: 50 mg via INTRAVENOUS

## 2020-07-24 MED ORDER — PALONOSETRON HCL INJECTION 0.25 MG/5ML
0.2500 mg | Freq: Once | INTRAVENOUS | Status: AC
  Administered 2020-07-24: 0.25 mg via INTRAVENOUS

## 2020-07-24 MED ORDER — SODIUM CHLORIDE 0.9 % IV SOLN
120.0000 mg | Freq: Once | INTRAVENOUS | Status: AC
  Administered 2020-07-24: 120 mg via INTRAVENOUS
  Filled 2020-07-24: qty 12

## 2020-07-24 MED ORDER — SODIUM CHLORIDE 0.9 % IV SOLN
20.0000 mg | Freq: Once | INTRAVENOUS | Status: AC
  Administered 2020-07-24: 20 mg via INTRAVENOUS
  Filled 2020-07-24: qty 20

## 2020-07-24 MED ORDER — SODIUM CHLORIDE 0.9 % IV SOLN
45.0000 mg/m2 | Freq: Once | INTRAVENOUS | Status: AC
  Administered 2020-07-24: 96 mg via INTRAVENOUS
  Filled 2020-07-24: qty 16

## 2020-07-24 MED ORDER — FAMOTIDINE 20 MG IN NS 100 ML IVPB
20.0000 mg | Freq: Once | INTRAVENOUS | Status: AC
Start: 2020-07-24 — End: 2020-07-24
  Administered 2020-07-24: 20 mg via INTRAVENOUS

## 2020-07-24 MED ORDER — DIPHENHYDRAMINE HCL 50 MG/ML IJ SOLN
INTRAMUSCULAR | Status: AC
  Filled 2020-07-24: qty 1

## 2020-07-24 MED ORDER — SODIUM CHLORIDE 0.9 % IV SOLN
Freq: Once | INTRAVENOUS | Status: AC
  Filled 2020-07-24: qty 250

## 2020-07-24 NOTE — Progress Notes (Signed)
[facility] Spiritual Care Note  Jason [name] was in good spirits in this follow-up visit in infusion, citing having no side effects of treatment. He is playing as much golf as possible and living his life normally, with the exception of the time of treatment itself. We plan a pastoral check-in at his next treatment.   [place], [place], [facility] Pager [phone] Voicemail [phone]

## 2020-07-24 NOTE — Progress Notes (Signed)
Per Dr Julien Nordmann ,it is okay to treat pt today with Carboplatin and taxol and creatinine of 2.25.

## 2020-07-24 NOTE — Progress Notes (Signed)
Creatnine 2.25, okay to proceed per provider

## 2020-07-24 NOTE — Progress Notes (Signed)
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   PRIOR THERAPY: None   CURRENT THERAPY: concurrent chemoradiation with weekly carboplatin for AUC of 2 and paclitaxel 45 Mg/M2. Status post 3 cycles. First dose on 07/04/20.   INTERVAL HISTORY: Jason [name]. 70 y.o. male returns to the clinic today for follow-up visit.  The patient is feeling fine today with no concerning complaints.  He denied having any current chest pain, shortness of breath, cough or hemoptysis.  He denied having any fever or chills.  He has no nausea, vomiting, diarrhea or constipation.  He has no headache or visual changes.  He lost few pounds since his last visit but he is very active and walks his dog.  The patient is here today for evaluation before starting cycle #4 of his treatment.  MEDICAL HISTORY: Past Medical History:  Diagnosis Date   Arthritis    back   Hypertension    Prostate cancer ([facility])    Tobacco use     ALLERGIES:  has No Known Allergies.  MEDICATIONS:  Current Outpatient Medications  Medication Sig Dispense Refill   azelastine (ASTELIN) 0.1 % nasal spray Place 1 spray into both nostrils 2 (two) times daily as needed for rhinitis. Use in each nostril as directed     fluticasone (FLONASE) 50 MCG/ACT nasal spray Place 2 sprays into both nostrils daily.     levocetirizine (XYZAL) 5 MG tablet Take 5 mg by mouth every evening.     montelukast (SINGULAIR) 10 MG tablet Take 10 mg by mouth at bedtime.     valsartan-hydrochlorothiazide (DIOVAN-HCT) 320-25 MG tablet Take  1 tablet by mouth daily.     acetaminophen (TYLENOL) 325 MG tablet Take 650 mg by mouth every 6 (six) hours as needed. (Patient not taking: No sig reported)     ALPRAZolam (XANAX) 0.5 MG tablet Take 0.5 mg by mouth daily as needed.     atorvastatin (LIPITOR) 10 MG tablet Take 10 mg by mouth daily. (Patient not taking: No sig reported)     ketoconazole (NIZORAL) 2 % shampoo  (Patient not taking: No sig reported)     prochlorperazine (COMPAZINE) 10 MG tablet Take 1 tablet (10 mg total) by mouth every 6 (six) hours as needed for nausea or vomiting. (Patient not taking: No sig reported) 30 tablet 0   No current facility-administered medications for this visit.    SURGICAL HISTORY:  Past Surgical History:  Procedure Laterality Date   BACK SURGERY     BRONCHIAL BIOPSY  06/06/2020   Procedure: BRONCHIAL BIOPSIES;  Surgeon: Garner Nash, DO;  Location: [facility];  Service: Pulmonary;;   BRONCHIAL BRUSHINGS  06/06/2020   Procedure: BRONCHIAL BRUSHINGS;  Surgeon: Garner Nash, DO;  Location: [facility];  Service: Pulmonary;;   BRONCHIAL NEEDLE ASPIRATION BIOPSY  06/06/2020   Procedure: BRONCHIAL NEEDLE ASPIRATION BIOPSIES;  Surgeon: Garner Nash, DO;  Location: [facility];  Service: Pulmonary;;   BRONCHIAL WASHINGS  06/06/2020   Procedure: BRONCHIAL WASHINGS;  Surgeon: Garner Nash, DO;  Location: [facility];  Service: Pulmonary;;  COLONOSCOPY     OTHER SURGICAL HISTORY     fluid drawn off his testicle   PROSTATE BIOPSY     ROTATOR CUFF REPAIR Right    VIDEO BRONCHOSCOPY WITH ENDOBRONCHIAL NAVIGATION Right 06/06/2020   Procedure: VIDEO BRONCHOSCOPY WITH ENDOBRONCHIAL NAVIGATION;  Surgeon: Garner Nash, DO;  Location: [facility];  Service: Pulmonary;  Laterality: Right;   VIDEO BRONCHOSCOPY WITH ENDOBRONCHIAL ULTRASOUND Bilateral 06/06/2020   Procedure: VIDEO BRONCHOSCOPY WITH ENDOBRONCHIAL ULTRASOUND;  Surgeon: Garner Nash, DO;  Location: [facility];  Service:  Pulmonary;  Laterality: Bilateral;   WRIST SURGERY      REVIEW OF SYSTEMS:  A comprehensive review of systems was negative except for: Constitutional: positive for fatigue   PHYSICAL EXAMINATION: General appearance: alert, cooperative, and no distress Head: Normocephalic, without obvious abnormality, atraumatic Neck: no adenopathy, no JVD, supple, symmetrical, trachea midline, and thyroid not enlarged, symmetric, no tenderness/mass/nodules Lymph nodes: Cervical, supraclavicular, and axillary nodes normal. Resp: clear to auscultation bilaterally Back: symmetric, no curvature. ROM normal. No CVA tenderness. Cardio: regular rate and rhythm, S1, S2 normal, no murmur, click, rub or gallop GI: soft, non-tender; bowel sounds normal; no masses,  no organomegaly Extremities: extremities normal, atraumatic, no cyanosis or edema  ECOG PERFORMANCE STATUS: 1 - Symptomatic but completely ambulatory  Blood pressure 122/72, pulse 68, temperature (!) 97.2 F (36.2 C), temperature source Tympanic, resp. rate 18, height 5\' 10"  (1.778 m), weight 189 lb 1.6 oz (85.8 kg), SpO2 98 %.  LABORATORY DATA: Lab Results  Component Value Date   WBC 4.0 07/24/2020   HGB 11.3 (L) 07/24/2020   HCT 34.8 (L) 07/24/2020   MCV 89.5 07/24/2020   PLT 239 07/24/2020      Chemistry      Component Value Date/Time   NA 139 07/24/2020 1157   K 4.6 07/24/2020 1157   CL 110 07/24/2020 1157   CO2 19 (L) 07/24/2020 1157   BUN 39 (H) 07/24/2020 1157   CREATININE 2.25 (H) 07/24/2020 1157      Component Value Date/Time   CALCIUM 9.1 07/24/2020 1157   ALKPHOS 84 07/24/2020 1157   AST 13 (L) 07/24/2020 1157   ALT 13 07/24/2020 1157   BILITOT 0.4 07/24/2020 1157       RADIOGRAPHIC STUDIES: NM Bone Scan Whole Body  Result Date: 07/13/2020 CLINICAL DATA:  Prostate cancer. Elevated PSA. New lung lesion also discovered. EXAM: NUCLEAR MEDICINE WHOLE BODY BONE SCAN TECHNIQUE: Whole body anterior and posterior images were  obtained approximately 3 hours after intravenous injection of radiopharmaceutical. RADIOPHARMACEUTICALS:  18.7 mCi Technetium-80m MDP IV COMPARISON:  PET scan 06/02/2020. FINDINGS: There is no skeletal activity to suggest osseous metastatic disease. Mild osteoarthritic changes at the patellofemoral joint on the left, the ankle and foot on the right, minimal changes in the spine and mild degenerative type change in the shoulders and sternoclavicular joints. IMPRESSION: Mild degenerative changes as above, without findings suggestive of osseous metastatic disease. Electronically Signed   By: Nelson Chimes M.D.   On: 07/13/2020 10:27    ASSESSMENT AND PLAN: This is a very pleasant 70 years old African-American male recently diagnosed with a stage IIIB (T1c, N3, M0) non-small cell lung cancer, squamous cell carcinoma presented with right upper lobe lung nodule in addition to subcarinal and suspicious AP window lymphadenopathy diagnosed in May 2022.  The patient also has a history of prostate adenocarcinoma diagnosed in April 2022. He is currently undergoing a course of concurrent chemoradiation with weekly carboplatin for AUC of  2 and paclitaxel 45 Mg/M2 status post 3 cycles.  The patient has been tolerating this treatment well with no concerning adverse effects. I recommended for him to proceed with cycle #4 today as planned. He will come back for follow-up visit in 2 weeks for evaluation before starting cycle #6. The patient was advised to call immediately if he has any concerning symptoms in the interval. The patient voices understanding of current disease status and treatment options and is in agreement with the current care plan.  All questions were answered. The patient knows to call the clinic with any problems, questions or concerns. We can certainly see the patient much sooner if necessary.  The total time spent in the appointment was 20 minutes.  Disclaimer: This note was dictated with voice  recognition software. Similar sounding words can inadvertently be transcribed and may not be corrected upon review.

## 2020-07-24 NOTE — Patient Instructions (Signed)
[facility]  Discharge Instructions: Thank you for choosing [facility] to provide your oncology and hematology care.   If you have a lab appointment with the Cancer Center, please go directly to the Cancer Center and check in at the registration area.   Wear comfortable clothing and clothing appropriate for easy access to any Portacath or PICC line.   We strive to give you quality time with your provider. You may need to reschedule your appointment if you arrive late (15 or more minutes).  Arriving late affects you and other patients whose appointments are after yours.  Also, if you miss three or more appointments without notifying the office, you may be dismissed from the clinic at the provider's discretion.      For prescription refill requests, have your pharmacy contact our office and allow 72 hours for refills to be completed.    Today you received the following chemotherapy and/or immunotherapy agents Taxol and Carboplatin       To help prevent nausea and vomiting after your treatment, we encourage you to take your nausea medication as directed.  BELOW ARE SYMPTOMS THAT SHOULD BE REPORTED IMMEDIATELY: *FEVER GREATER THAN 100.4 F (38 C) OR HIGHER *CHILLS OR SWEATING *NAUSEA AND VOMITING THAT IS NOT CONTROLLED WITH YOUR NAUSEA MEDICATION *UNUSUAL SHORTNESS OF BREATH *UNUSUAL BRUISING OR BLEEDING *URINARY PROBLEMS (pain or burning when urinating, or frequent urination) *BOWEL PROBLEMS (unusual diarrhea, constipation, pain near the anus) TENDERNESS IN MOUTH AND THROAT WITH OR WITHOUT PRESENCE OF ULCERS (sore throat, sores in mouth, or a toothache) UNUSUAL RASH, SWELLING OR PAIN  UNUSUAL VAGINAL DISCHARGE OR ITCHING   Items with * indicate a potential emergency and should be followed up as soon as possible or go to the Emergency Department if any problems should occur.  Please show the CHEMOTHERAPY ALERT CARD or IMMUNOTHERAPY ALERT CARD at  check-in to the Emergency Department and triage nurse.  Should you have questions after your visit or need to cancel or reschedule your appointment, please contact [facility]  Dept: [phone]  and follow the prompts.  Office hours are 8:00 a.m. to 4:30 p.m. Monday - Friday. Please note that voicemails left after 4:00 p.m. may not be returned until the following business day.  We are closed weekends and major holidays. You have access to a nurse at all times for urgent questions. Please call the main number to the clinic Dept: [phone] and follow the prompts.   For any non-urgent questions, you may also contact your provider using MyChart. We now offer e-Visits for anyone 18 and older to request care online for non-urgent symptoms. For details visit [email or web address].   Also download the MyChart app! Go to the app store, search "MyChart", open the app, select , and log in with your MyChart username and password.  Due to Covid, a mask is required upon entering the hospital/clinic. If you do not have a mask, one will be given to you upon arrival. For doctor visits, patients may have 1 support person aged 18 or older with them. For treatment visits, patients cannot have anyone with them due to current Covid guidelines and our immunocompromised population.   

## 2020-07-25 ENCOUNTER — Ambulatory Visit
Admission: RE | Admit: 2020-07-25 | Discharge: 2020-07-25 | Disposition: A | Discharge status: Home / Self Care | Payer: Medicare Other | Source: Ambulatory Visit | Attending: Radiation Oncology | Admitting: Radiation Oncology

## 2020-07-25 ENCOUNTER — Encounter: Payer: Self-pay | Admitting: Internal Medicine

## 2020-07-25 ENCOUNTER — Other Ambulatory Visit: Payer: Self-pay

## 2020-07-25 DIAGNOSIS — C61 Malignant neoplasm of prostate: Secondary | ICD-10-CM | POA: Diagnosis not present

## 2020-07-26 ENCOUNTER — Telehealth: Payer: Self-pay | Admitting: Dietician

## 2020-07-26 ENCOUNTER — Other Ambulatory Visit: Payer: Self-pay

## 2020-07-26 ENCOUNTER — Ambulatory Visit
Admission: RE | Admit: 2020-07-26 | Discharge: 2020-07-26 | Disposition: A | Discharge status: Home / Self Care | Payer: Medicare Other | Source: Ambulatory Visit | Attending: Radiation Oncology | Admitting: Radiation Oncology

## 2020-07-26 DIAGNOSIS — C61 Malignant neoplasm of prostate: Secondary | ICD-10-CM | POA: Diagnosis not present

## 2020-07-26 NOTE — Telephone Encounter (Signed)
Nutrition  Patient identified on MST. Attempted to contact patient via telephone for nutrition assessment. Patient did not answer. Left message on voicemail with request for return call. Contact information provided.

## 2020-07-27 ENCOUNTER — Ambulatory Visit
Admission: RE | Admit: 2020-07-27 | Discharge: 2020-07-27 | Disposition: A | Discharge status: Home / Self Care | Payer: Medicare Other | Source: Ambulatory Visit | Attending: Radiation Oncology | Admitting: Radiation Oncology

## 2020-07-27 DIAGNOSIS — C61 Malignant neoplasm of prostate: Secondary | ICD-10-CM | POA: Diagnosis not present

## 2020-07-28 ENCOUNTER — Ambulatory Visit
Admission: RE | Admit: 2020-07-28 | Discharge: 2020-07-28 | Disposition: A | Discharge status: Home / Self Care | Payer: Medicare Other | Source: Ambulatory Visit | Attending: Radiation Oncology | Admitting: Radiation Oncology

## 2020-07-28 ENCOUNTER — Other Ambulatory Visit: Payer: Self-pay

## 2020-07-28 DIAGNOSIS — Z5111 Encounter for antineoplastic chemotherapy: Secondary | ICD-10-CM | POA: Diagnosis not present

## 2020-07-28 DIAGNOSIS — C61 Malignant neoplasm of prostate: Secondary | ICD-10-CM | POA: Insufficient documentation

## 2020-07-28 DIAGNOSIS — C3411 Malignant neoplasm of upper lobe, right bronchus or lung: Secondary | ICD-10-CM | POA: Insufficient documentation

## 2020-08-01 ENCOUNTER — Ambulatory Visit
Admission: RE | Admit: 2020-08-01 | Discharge: 2020-08-01 | Disposition: A | Discharge status: Home / Self Care | Payer: Medicare Other | Source: Ambulatory Visit | Attending: Radiation Oncology | Admitting: Radiation Oncology

## 2020-08-01 ENCOUNTER — Inpatient Hospital Stay: Payer: Medicare Other | Attending: Internal Medicine

## 2020-08-01 ENCOUNTER — Inpatient Hospital Stay: Payer: Medicare Other

## 2020-08-01 ENCOUNTER — Encounter: Payer: Self-pay | Admitting: General Practice

## 2020-08-01 ENCOUNTER — Other Ambulatory Visit: Payer: Self-pay

## 2020-08-01 VITALS — BP 113/78 | HR 83 | Temp 98.2°F | Resp 18 | Wt 189.8 lb

## 2020-08-01 DIAGNOSIS — C3411 Malignant neoplasm of upper lobe, right bronchus or lung: Secondary | ICD-10-CM | POA: Insufficient documentation

## 2020-08-01 DIAGNOSIS — Z5111 Encounter for antineoplastic chemotherapy: Secondary | ICD-10-CM | POA: Diagnosis not present

## 2020-08-01 DIAGNOSIS — C3491 Malignant neoplasm of unspecified part of right bronchus or lung: Secondary | ICD-10-CM

## 2020-08-01 DIAGNOSIS — C3490 Malignant neoplasm of unspecified part of unspecified bronchus or lung: Secondary | ICD-10-CM

## 2020-08-01 LAB — CBC WITH DIFFERENTIAL (CANCER CENTER ONLY)
Abs Immature Granulocytes: 0.02 10*3/uL (ref 0.00–0.07)
Basophils Absolute: 0 10*3/uL (ref 0.0–0.1)
Basophils Relative: 1 %
Eosinophils Absolute: 0.1 10*3/uL (ref 0.0–0.5)
Eosinophils Relative: 2 %
HCT: 34.5 % — ABNORMAL LOW (ref 39.0–52.0)
Hemoglobin: 11.3 g/dL — ABNORMAL LOW (ref 13.0–17.0)
Immature Granulocytes: 1 %
Lymphocytes Relative: 23 %
Lymphs Abs: 0.9 10*3/uL (ref 0.7–4.0)
MCH: 29.4 pg (ref 26.0–34.0)
MCHC: 32.8 g/dL (ref 30.0–36.0)
MCV: 89.6 fL (ref 80.0–100.0)
Monocytes Absolute: 0.8 10*3/uL (ref 0.1–1.0)
Monocytes Relative: 22 %
Neutro Abs: 2 10*3/uL (ref 1.7–7.7)
Neutrophils Relative %: 51 %
Platelet Count: 177 10*3/uL (ref 150–400)
RBC: 3.85 MIL/uL — ABNORMAL LOW (ref 4.22–5.81)
RDW: 15.1 % (ref 11.5–15.5)
WBC Count: 3.8 10*3/uL — ABNORMAL LOW (ref 4.0–10.5)
nRBC: 0 % (ref 0.0–0.2)

## 2020-08-01 LAB — CMP (CANCER CENTER ONLY)
ALT: 15 U/L (ref 0–44)
AST: 15 U/L (ref 15–41)
Albumin: 3.2 g/dL — ABNORMAL LOW (ref 3.5–5.0)
Alkaline Phosphatase: 90 U/L (ref 38–126)
Anion gap: 6 (ref 5–15)
BUN: 43 mg/dL — ABNORMAL HIGH (ref 8–23)
CO2: 23 mmol/L (ref 22–32)
Calcium: 9.6 mg/dL (ref 8.9–10.3)
Chloride: 108 mmol/L (ref 98–111)
Creatinine: 2.12 mg/dL — ABNORMAL HIGH (ref 0.61–1.24)
GFR, Estimated: 33 mL/min — ABNORMAL LOW (ref >60)
Glucose, Bld: 84 mg/dL (ref 70–99)
Potassium: 4.9 mmol/L (ref 3.5–5.1)
Sodium: 137 mmol/L (ref 135–145)
Total Bilirubin: 0.4 mg/dL (ref 0.3–1.2)
Total Protein: 7.2 g/dL (ref 6.5–8.1)

## 2020-08-01 MED ORDER — PALONOSETRON HCL INJECTION 0.25 MG/5ML
0.2500 mg | Freq: Once | INTRAVENOUS | Status: AC
  Administered 2020-08-01: 0.25 mg via INTRAVENOUS

## 2020-08-01 MED ORDER — DEXAMETHASONE SODIUM PHOSPHATE 100 MG/10ML IJ SOLN
20.0000 mg | Freq: Once | INTRAMUSCULAR | Status: AC
  Administered 2020-08-01: 20 mg via INTRAVENOUS
  Filled 2020-08-01: qty 2
  Filled 2020-08-01: qty 20
  Filled 2020-08-01: qty 2

## 2020-08-01 MED ORDER — PALONOSETRON HCL INJECTION 0.25 MG/5ML
INTRAVENOUS | Status: AC
  Filled 2020-08-01: qty 5

## 2020-08-01 MED ORDER — DIPHENHYDRAMINE HCL 50 MG/ML IJ SOLN
INTRAMUSCULAR | Status: AC
  Filled 2020-08-01: qty 1

## 2020-08-01 MED ORDER — SODIUM CHLORIDE 0.9 % IV SOLN
45.0000 mg/m2 | Freq: Once | INTRAVENOUS | Status: AC
  Administered 2020-08-01: 96 mg via INTRAVENOUS
  Filled 2020-08-01: qty 16

## 2020-08-01 MED ORDER — DIPHENHYDRAMINE HCL 50 MG/ML IJ SOLN
50.0000 mg | Freq: Once | INTRAMUSCULAR | Status: AC
  Administered 2020-08-01: 50 mg via INTRAVENOUS

## 2020-08-01 MED ORDER — SODIUM CHLORIDE 0.9 % IV SOLN
Freq: Once | INTRAVENOUS | Status: AC
  Filled 2020-08-01: qty 250

## 2020-08-01 MED ORDER — FAMOTIDINE 20 MG IN NS 100 ML IVPB
INTRAVENOUS | Status: AC
  Filled 2020-08-01: qty 100

## 2020-08-01 MED ORDER — SODIUM CHLORIDE 0.9 % IV SOLN
120.0000 mg | Freq: Once | INTRAVENOUS | Status: AC
  Administered 2020-08-01: 120 mg via INTRAVENOUS
  Filled 2020-08-01: qty 12

## 2020-08-01 MED ORDER — FAMOTIDINE 20 MG IN NS 100 ML IVPB
20.0000 mg | Freq: Once | INTRAVENOUS | Status: AC
  Administered 2020-08-01: 20 mg via INTRAVENOUS

## 2020-08-01 NOTE — Patient Instructions (Signed)
[facility]  Discharge Instructions: Thank you for choosing [facility] to provide your oncology and hematology care.   If you have a lab appointment with the Cancer Center, please go directly to the Cancer Center and check in at the registration area.   Wear comfortable clothing and clothing appropriate for easy access to any Portacath or PICC line.   We strive to give you quality time with your provider. You may need to reschedule your appointment if you arrive late (15 or more minutes).  Arriving late affects you and other patients whose appointments are after yours.  Also, if you miss three or more appointments without notifying the office, you may be dismissed from the clinic at the provider's discretion.      For prescription refill requests, have your pharmacy contact our office and allow 72 hours for refills to be completed.    Today you received the following chemotherapy and/or immunotherapy agents: Paclitaxel (Taxol) and Carboplatin.   To help prevent nausea and vomiting after your treatment, we encourage you to take your nausea medication as directed.  BELOW ARE SYMPTOMS THAT SHOULD BE REPORTED IMMEDIATELY: *FEVER GREATER THAN 100.4 F (38 C) OR HIGHER *CHILLS OR SWEATING *NAUSEA AND VOMITING THAT IS NOT CONTROLLED WITH YOUR NAUSEA MEDICATION *UNUSUAL SHORTNESS OF BREATH *UNUSUAL BRUISING OR BLEEDING *URINARY PROBLEMS (pain or burning when urinating, or frequent urination) *BOWEL PROBLEMS (unusual diarrhea, constipation, pain near the anus) TENDERNESS IN MOUTH AND THROAT WITH OR WITHOUT PRESENCE OF ULCERS (sore throat, sores in mouth, or a toothache) UNUSUAL RASH, SWELLING OR PAIN  UNUSUAL VAGINAL DISCHARGE OR ITCHING   Items with * indicate a potential emergency and should be followed up as soon as possible or go to the Emergency Department if any problems should occur.  Please show the CHEMOTHERAPY ALERT CARD or IMMUNOTHERAPY  ALERT CARD at check-in to the Emergency Department and triage nurse.  Should you have questions after your visit or need to cancel or reschedule your appointment, please contact [facility]  Dept: [phone]  and follow the prompts.  Office hours are 8:00 a.m. to 4:30 p.m. Monday - Friday. Please note that voicemails left after 4:00 p.m. may not be returned until the following business day.  We are closed weekends and major holidays. You have access to a nurse at all times for urgent questions. Please call the main number to the clinic Dept: [phone] and follow the prompts.   For any non-urgent questions, you may also contact your provider using MyChart. We now offer e-Visits for anyone 18 and older to request care online for non-urgent symptoms. For details visit [email or web address].   Also download the MyChart app! Go to the app store, search "MyChart", open the app, select [facility], and log in with your MyChart username and password.  Due to Covid, a mask is required upon entering the hospital/clinic. If you do not have a mask, one will be given to you upon arrival. For doctor visits, patients may have 1 support person aged 18 or older with them. For treatment visits, patients cannot have anyone with them due to current Covid guidelines and our immunocompromised population.   

## 2020-08-01 NOTE — Progress Notes (Signed)
[facility] Spiritual Care Note  Followed up with Jason [name] in infusion as planned. He was lower energy today, citing significant yard work this weekend. He reports being well and in good spirits overall and notes that he has my card and will call as needed/desired.   [place], [place], [facility] Pager [phone] Voicemail [phone]

## 2020-08-01 NOTE — Progress Notes (Signed)
Per Dr. Julien Nordmann - okay to treat with elevated creatinine levels.

## 2020-08-02 ENCOUNTER — Ambulatory Visit
Admission: RE | Admit: 2020-08-02 | Discharge: 2020-08-02 | Disposition: A | Discharge status: Home / Self Care | Payer: Medicare Other | Source: Ambulatory Visit | Attending: Radiation Oncology | Admitting: Radiation Oncology

## 2020-08-02 DIAGNOSIS — Z5111 Encounter for antineoplastic chemotherapy: Secondary | ICD-10-CM | POA: Diagnosis not present

## 2020-08-03 ENCOUNTER — Ambulatory Visit
Admission: RE | Admit: 2020-08-03 | Discharge: 2020-08-03 | Disposition: A | Discharge status: Home / Self Care | Payer: Medicare Other | Source: Ambulatory Visit | Attending: Radiation Oncology | Admitting: Radiation Oncology

## 2020-08-03 ENCOUNTER — Other Ambulatory Visit: Payer: Self-pay

## 2020-08-03 DIAGNOSIS — Z5111 Encounter for antineoplastic chemotherapy: Secondary | ICD-10-CM | POA: Diagnosis not present

## 2020-08-04 ENCOUNTER — Ambulatory Visit
Admission: RE | Admit: 2020-08-04 | Discharge: 2020-08-04 | Disposition: A | Discharge status: Home / Self Care | Payer: Medicare Other | Source: Ambulatory Visit | Attending: Radiation Oncology | Admitting: Radiation Oncology

## 2020-08-04 DIAGNOSIS — Z5111 Encounter for antineoplastic chemotherapy: Secondary | ICD-10-CM | POA: Diagnosis not present

## 2020-08-07 ENCOUNTER — Other Ambulatory Visit: Payer: Self-pay

## 2020-08-07 ENCOUNTER — Inpatient Hospital Stay: Payer: Medicare Other

## 2020-08-07 ENCOUNTER — Encounter: Payer: Self-pay | Admitting: Internal Medicine

## 2020-08-07 ENCOUNTER — Inpatient Hospital Stay (HOSPITAL_BASED_OUTPATIENT_CLINIC_OR_DEPARTMENT_OTHER): Payer: Medicare Other | Admitting: Internal Medicine

## 2020-08-07 ENCOUNTER — Ambulatory Visit
Admission: RE | Admit: 2020-08-07 | Discharge: 2020-08-07 | Disposition: A | Discharge status: Home / Self Care | Payer: Medicare Other | Source: Ambulatory Visit | Attending: Radiation Oncology | Admitting: Radiation Oncology

## 2020-08-07 VITALS — BP 121/70 | HR 65 | Temp 98.5°F | Resp 19 | Ht 70.0 in | Wt 192.9 lb

## 2020-08-07 DIAGNOSIS — C3490 Malignant neoplasm of unspecified part of unspecified bronchus or lung: Secondary | ICD-10-CM

## 2020-08-07 DIAGNOSIS — C3491 Malignant neoplasm of unspecified part of right bronchus or lung: Secondary | ICD-10-CM

## 2020-08-07 DIAGNOSIS — Z5111 Encounter for antineoplastic chemotherapy: Secondary | ICD-10-CM

## 2020-08-07 LAB — CMP (CANCER CENTER ONLY)
ALT: 20 U/L (ref 0–44)
AST: 16 U/L (ref 15–41)
Albumin: 3.1 g/dL — ABNORMAL LOW (ref 3.5–5.0)
Alkaline Phosphatase: 87 U/L (ref 38–126)
Anion gap: 9 (ref 5–15)
BUN: 31 mg/dL — ABNORMAL HIGH (ref 8–23)
CO2: 19 mmol/L — ABNORMAL LOW (ref 22–32)
Calcium: 9.4 mg/dL (ref 8.9–10.3)
Chloride: 109 mmol/L (ref 98–111)
Creatinine: 1.74 mg/dL — ABNORMAL HIGH (ref 0.61–1.24)
GFR, Estimated: 41 mL/min — ABNORMAL LOW (ref >60)
Glucose, Bld: 104 mg/dL — ABNORMAL HIGH (ref 70–99)
Potassium: 4.4 mmol/L (ref 3.5–5.1)
Sodium: 137 mmol/L (ref 135–145)
Total Bilirubin: 0.3 mg/dL (ref 0.3–1.2)
Total Protein: 6.9 g/dL (ref 6.5–8.1)

## 2020-08-07 LAB — CBC WITH DIFFERENTIAL (CANCER CENTER ONLY)
Abs Immature Granulocytes: 0.02 10*3/uL (ref 0.00–0.07)
Basophils Absolute: 0 10*3/uL (ref 0.0–0.1)
Basophils Relative: 1 %
Eosinophils Absolute: 0.1 10*3/uL (ref 0.0–0.5)
Eosinophils Relative: 2 %
HCT: 33.5 % — ABNORMAL LOW (ref 39.0–52.0)
Hemoglobin: 11.2 g/dL — ABNORMAL LOW (ref 13.0–17.0)
Immature Granulocytes: 1 %
Lymphocytes Relative: 21 %
Lymphs Abs: 0.7 10*3/uL (ref 0.7–4.0)
MCH: 29.5 pg (ref 26.0–34.0)
MCHC: 33.4 g/dL (ref 30.0–36.0)
MCV: 88.2 fL (ref 80.0–100.0)
Monocytes Absolute: 0.5 10*3/uL (ref 0.1–1.0)
Monocytes Relative: 13 %
Neutro Abs: 2.1 10*3/uL (ref 1.7–7.7)
Neutrophils Relative %: 62 %
Platelet Count: 162 10*3/uL (ref 150–400)
RBC: 3.8 MIL/uL — ABNORMAL LOW (ref 4.22–5.81)
RDW: 14.9 % (ref 11.5–15.5)
WBC Count: 3.4 10*3/uL — ABNORMAL LOW (ref 4.0–10.5)
nRBC: 0 % (ref 0.0–0.2)

## 2020-08-07 MED ORDER — SODIUM CHLORIDE 0.9 % IV SOLN
20.0000 mg | Freq: Once | INTRAVENOUS | Status: AC
  Administered 2020-08-07: 20 mg via INTRAVENOUS
  Filled 2020-08-07: qty 20

## 2020-08-07 MED ORDER — SODIUM CHLORIDE 0.9 % IV SOLN
140.0000 mg | Freq: Once | INTRAVENOUS | Status: AC
  Administered 2020-08-07: 140 mg via INTRAVENOUS
  Filled 2020-08-07: qty 14

## 2020-08-07 MED ORDER — PALONOSETRON HCL INJECTION 0.25 MG/5ML
0.2500 mg | Freq: Once | INTRAVENOUS | Status: AC
  Administered 2020-08-07: 0.25 mg via INTRAVENOUS

## 2020-08-07 MED ORDER — SODIUM CHLORIDE 0.9 % IV SOLN
45.0000 mg/m2 | Freq: Once | INTRAVENOUS | Status: AC
  Administered 2020-08-07: 96 mg via INTRAVENOUS
  Filled 2020-08-07: qty 16

## 2020-08-07 MED ORDER — DIPHENHYDRAMINE HCL 50 MG/ML IJ SOLN
INTRAMUSCULAR | Status: AC
  Filled 2020-08-07: qty 1

## 2020-08-07 MED ORDER — SODIUM CHLORIDE 0.9 % IV SOLN
Freq: Once | INTRAVENOUS | Status: AC
  Filled 2020-08-07: qty 250

## 2020-08-07 MED ORDER — DIPHENHYDRAMINE HCL 50 MG/ML IJ SOLN
50.0000 mg | Freq: Once | INTRAMUSCULAR | Status: AC
  Administered 2020-08-07: 50 mg via INTRAVENOUS

## 2020-08-07 MED ORDER — FAMOTIDINE 20 MG IN NS 100 ML IVPB
20.0000 mg | Freq: Once | INTRAVENOUS | Status: AC
  Administered 2020-08-07: 20 mg via INTRAVENOUS

## 2020-08-07 MED ORDER — PALONOSETRON HCL INJECTION 0.25 MG/5ML
INTRAVENOUS | Status: AC
  Filled 2020-08-07: qty 5

## 2020-08-07 MED ORDER — FAMOTIDINE 20 MG IN NS 100 ML IVPB
INTRAVENOUS | Status: AC
  Filled 2020-08-07: qty 100

## 2020-08-07 NOTE — Patient Instructions (Signed)
[facility]  Discharge Instructions: Thank you for choosing [facility] to provide your oncology and hematology care.   If you have a lab appointment with the [facility], please go directly to the [facility] and check in at the registration area.   Wear comfortable clothing and clothing appropriate for easy access to any Portacath or PICC line.   We strive to give you quality time with your provider. You may need to reschedule your appointment if you arrive late (15 or more minutes).  Arriving late affects you and other patients whose appointments are after yours.  Also, if you miss three or more appointments without notifying the office, you may be dismissed from the clinic at the provider's discretion.      For prescription refill requests, have your pharmacy contact our office and allow 72 hours for refills to be completed.    Today you received the following chemotherapy and/or immunotherapy agents paclitaxel, carboplatin      To help prevent nausea and vomiting after your treatment, we encourage you to take your nausea medication as directed.  BELOW ARE SYMPTOMS THAT SHOULD BE REPORTED IMMEDIATELY: *FEVER GREATER THAN 100.4 F (38 C) OR HIGHER *CHILLS OR SWEATING *NAUSEA AND VOMITING THAT IS NOT CONTROLLED WITH YOUR NAUSEA MEDICATION *UNUSUAL SHORTNESS OF BREATH *UNUSUAL BRUISING OR BLEEDING *URINARY PROBLEMS (pain or burning when urinating, or frequent urination) *BOWEL PROBLEMS (unusual diarrhea, constipation, pain near the anus) TENDERNESS IN MOUTH AND THROAT WITH OR WITHOUT PRESENCE OF ULCERS (sore throat, sores in mouth, or a toothache) UNUSUAL RASH, SWELLING OR PAIN  UNUSUAL VAGINAL DISCHARGE OR ITCHING   Items with * indicate a potential emergency and should be followed up as soon as possible or go to the Emergency Department if any problems should occur.  Please show the CHEMOTHERAPY ALERT CARD or IMMUNOTHERAPY ALERT CARD at  check-in to the Emergency Department and triage nurse.  Should you have questions after your visit or need to cancel or reschedule your appointment, please contact [facility]  Dept: [phone]  and follow the prompts.  Office hours are 8:00 a.m. to 4:30 p.m. Monday - Friday. Please note that voicemails left after 4:00 p.m. may not be returned until the following business day.  We are closed weekends and major holidays. You have access to a nurse at all times for urgent questions. Please call the main number to the clinic Dept: [phone] and follow the prompts.   For any non-urgent questions, you may also contact your provider using MyChart. We now offer e-Visits for anyone 86 and older to request care online for non-urgent symptoms. For details visit [email or web address].   Also download the MyChart app! Go to the app store, search "MyChart", open the app, select [facility], and log in with your MyChart username and password.  Due to Covid, a mask is required upon entering the hospital/clinic. If you do not have a mask, one will be given to you upon arrival. For doctor visits, patients may have 1 support person aged 47 or older with them. For treatment visits, patients cannot have anyone with them due to current Covid guidelines and our immunocompromised population.

## 2020-08-07 NOTE — Progress Notes (Signed)
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   PRIOR THERAPY: None   CURRENT THERAPY: concurrent chemoradiation with weekly carboplatin for AUC of 2 and paclitaxel 45 Mg/M2. Status post 5 cycles. First dose on 07/04/20.   INTERVAL HISTORY: Jason [name]. 70 y.o. male returns to the clinic today for follow-up visit.  The patient is feeling fine today with no concerning complaints except for mild hoarseness of his voice started last week.  He denied having any current chest pain, shortness of breath, cough or hemoptysis.  He denied having any nausea, vomiting, diarrhea or constipation.  He has no dysphagia or odynophagia.  He has no recent weight loss or night sweats.  He continues to tolerate his course of concurrent chemoradiation fairly well.  He is here today for evaluation before starting cycle #6 of his treatment.  MEDICAL HISTORY: Past Medical History:  Diagnosis Date   Arthritis    back   Hypertension    Prostate cancer ([place])    Tobacco use     ALLERGIES:  has No Known Allergies.  MEDICATIONS:  Current Outpatient Medications  Medication Sig Dispense Refill   acetaminophen (TYLENOL) 325 MG tablet Take 650 mg by mouth every 6 (six) hours as needed.     azelastine (ASTELIN) 0.1 % nasal spray Place 1 spray into both nostrils 2 (two) times daily as needed for rhinitis. Use in each nostril as directed     fluticasone (FLONASE) 50 MCG/ACT nasal spray Place 2 sprays into both nostrils daily.     levocetirizine (XYZAL) 5 MG tablet Take 5 mg by mouth  every evening.     montelukast (SINGULAIR) 10 MG tablet Take 10 mg by mouth at bedtime.     valsartan-hydrochlorothiazide (DIOVAN-HCT) 320-25 MG tablet Take 1 tablet by mouth daily.     ALPRAZolam (XANAX) 0.5 MG tablet Take 0.5 mg by mouth daily as needed. (Patient not taking: Reported on 08/07/2020)     atorvastatin (LIPITOR) 10 MG tablet Take 10 mg by mouth daily. (Patient not taking: No sig reported)     ketoconazole (NIZORAL) 2 % shampoo  (Patient not taking: No sig reported)     prochlorperazine (COMPAZINE) 10 MG tablet Take 1 tablet (10 mg total) by mouth every 6 (six) hours as needed for nausea or vomiting. (Patient not taking: No sig reported) 30 tablet 0   No current facility-administered medications for this visit.    SURGICAL HISTORY:  Past Surgical History:  Procedure Laterality Date   BACK SURGERY     BRONCHIAL BIOPSY  06/06/2020   Procedure: BRONCHIAL BIOPSIES;  Surgeon: Garner Nash, DO;  Location: [facility];  Service: Pulmonary;;   BRONCHIAL BRUSHINGS  06/06/2020   Procedure: BRONCHIAL BRUSHINGS;  Surgeon: Garner Nash, DO;  Location: [facility];  Service: Pulmonary;;   BRONCHIAL NEEDLE ASPIRATION BIOPSY  06/06/2020   Procedure: BRONCHIAL NEEDLE ASPIRATION BIOPSIES;  Surgeon: Garner Nash, DO;  Location: [facility];  Service: Pulmonary;;   BRONCHIAL WASHINGS  06/06/2020   Procedure: BRONCHIAL WASHINGS;  Surgeon: Garner Nash, DO;  Location: [facility];  Service: Pulmonary;;   COLONOSCOPY     OTHER SURGICAL HISTORY     fluid drawn off his testicle   PROSTATE BIOPSY     ROTATOR CUFF REPAIR Right    VIDEO BRONCHOSCOPY WITH ENDOBRONCHIAL NAVIGATION Right 06/06/2020   Procedure: VIDEO BRONCHOSCOPY WITH ENDOBRONCHIAL NAVIGATION;  Surgeon: Garner Nash, DO;  Location: [facility];  Service: Pulmonary;  Laterality: Right;   VIDEO BRONCHOSCOPY WITH ENDOBRONCHIAL ULTRASOUND Bilateral 06/06/2020   Procedure: VIDEO BRONCHOSCOPY WITH ENDOBRONCHIAL ULTRASOUND;   Surgeon: Garner Nash, DO;  Location: [facility];  Service: Pulmonary;  Laterality: Bilateral;   WRIST SURGERY      REVIEW OF SYSTEMS:  A comprehensive review of systems was negative except for: Ears, nose, mouth, throat, and face: positive for hoarseness   PHYSICAL EXAMINATION: General appearance: alert, cooperative, and no distress Head: Normocephalic, without obvious abnormality, atraumatic Neck: no adenopathy, no JVD, supple, symmetrical, trachea midline, and thyroid not enlarged, symmetric, no tenderness/mass/nodules Lymph nodes: Cervical, supraclavicular, and axillary nodes normal. Resp: clear to auscultation bilaterally Back: symmetric, no curvature. ROM normal. No CVA tenderness. Cardio: regular rate and rhythm, S1, S2 normal, no murmur, click, rub or gallop GI: soft, non-tender; bowel sounds normal; no masses,  no organomegaly Extremities: extremities normal, atraumatic, no cyanosis or edema  ECOG PERFORMANCE STATUS: 1 - Symptomatic but completely ambulatory  Blood pressure 121/70, pulse 65, temperature 98.5 F (36.9 C), temperature source Oral, resp. rate 19, height 5\' 10"  (1.778 m), weight 192 lb 14.4 oz (87.5 kg), SpO2 100 %.  LABORATORY DATA: Lab Results  Component Value Date   WBC 3.4 (L) 08/07/2020   HGB 11.2 (L) 08/07/2020   HCT 33.5 (L) 08/07/2020   MCV 88.2 08/07/2020   PLT 162 08/07/2020      Chemistry      Component Value Date/Time   NA 137 08/01/2020 1205   K 4.9 08/01/2020 1205   CL 108 08/01/2020 1205   CO2 23 08/01/2020 1205   BUN 43 (H) 08/01/2020 1205   CREATININE 2.12 (H) 08/01/2020 1205      Component Value Date/Time   CALCIUM 9.6 08/01/2020 1205   ALKPHOS 90 08/01/2020 1205   AST 15 08/01/2020 1205   ALT 15 08/01/2020 1205   BILITOT 0.4 08/01/2020 1205       RADIOGRAPHIC STUDIES: NM Bone Scan Whole Body  Result Date: 07/13/2020 CLINICAL DATA:  Prostate cancer. Elevated PSA. New lung lesion also discovered. EXAM: NUCLEAR MEDICINE  WHOLE BODY BONE SCAN TECHNIQUE: Whole body anterior and posterior images were obtained approximately 3 hours after intravenous injection of radiopharmaceutical. RADIOPHARMACEUTICALS:  18.7 mCi Technetium-41m MDP IV COMPARISON:  PET scan 06/02/2020. FINDINGS: There is no skeletal activity to suggest osseous metastatic disease. Mild osteoarthritic changes at the patellofemoral joint on the left, the ankle and foot on the right, minimal changes in the spine and mild degenerative type change in the shoulders and sternoclavicular joints. IMPRESSION: Mild degenerative changes as above, without findings suggestive of osseous metastatic disease. Electronically Signed   By: Nelson Chimes M.D.   On: 07/13/2020 10:27    ASSESSMENT AND PLAN: This is a very pleasant 70 years old African-American male recently diagnosed with a stage IIIB (T1c, N3, M0) non-small cell lung cancer, squamous cell carcinoma presented with right upper lobe lung nodule in addition to subcarinal and suspicious AP window lymphadenopathy diagnosed in May 2022.  The patient also has a history of prostate adenocarcinoma diagnosed in April 2022. He is currently undergoing a course  of concurrent chemoradiation with weekly carboplatin for AUC of 2 and paclitaxel 45 Mg/M2 status post 5 cycles.   The patient continues to tolerate his treatment fairly well with no concerning adverse effects. I recommended for him to proceed with cycle #6 today as planned.  He is expected to complete this course of concurrent chemoradiation next week. I will see him back for follow-up visit in 1 months for evaluation with repeat CT scan of the chest for restaging of his disease. The patient was advised to call immediately if he has any concerning symptoms in the interval. The patient voices understanding of current disease status and treatment options and is in agreement with the current care plan.  All questions were answered. The patient knows to call the clinic with any  problems, questions or concerns. We can certainly see the patient much sooner if necessary.  Disclaimer: This note was dictated with voice recognition software. Similar sounding words can inadvertently be transcribed and may not be corrected upon review.

## 2020-08-07 NOTE — Progress Notes (Signed)
Per Dr. Julien Nordmann, ok to treat with Creat 1.74

## 2020-08-08 ENCOUNTER — Ambulatory Visit
Admission: RE | Admit: 2020-08-08 | Discharge: 2020-08-08 | Disposition: A | Discharge status: Home / Self Care | Payer: Medicare Other | Source: Ambulatory Visit | Attending: Radiation Oncology | Admitting: Radiation Oncology

## 2020-08-08 ENCOUNTER — Telehealth: Payer: Self-pay | Admitting: Internal Medicine

## 2020-08-08 DIAGNOSIS — Z5111 Encounter for antineoplastic chemotherapy: Secondary | ICD-10-CM | POA: Diagnosis not present

## 2020-08-08 NOTE — Telephone Encounter (Signed)
Scheduled per los. Called and left msg. Mailed printout  °

## 2020-08-09 ENCOUNTER — Ambulatory Visit
Admission: RE | Admit: 2020-08-09 | Discharge: 2020-08-09 | Disposition: A | Discharge status: Home / Self Care | Payer: Medicare Other | Source: Ambulatory Visit | Attending: Radiation Oncology | Admitting: Radiation Oncology

## 2020-08-09 ENCOUNTER — Other Ambulatory Visit: Payer: Self-pay

## 2020-08-09 DIAGNOSIS — Z5111 Encounter for antineoplastic chemotherapy: Secondary | ICD-10-CM | POA: Diagnosis not present

## 2020-08-10 ENCOUNTER — Ambulatory Visit
Admission: RE | Admit: 2020-08-10 | Discharge: 2020-08-10 | Disposition: A | Discharge status: Home / Self Care | Payer: Medicare Other | Source: Ambulatory Visit | Attending: Radiation Oncology | Admitting: Radiation Oncology

## 2020-08-10 ENCOUNTER — Other Ambulatory Visit: Payer: Self-pay

## 2020-08-10 DIAGNOSIS — Z5111 Encounter for antineoplastic chemotherapy: Secondary | ICD-10-CM | POA: Diagnosis not present

## 2020-08-11 ENCOUNTER — Ambulatory Visit
Admission: RE | Admit: 2020-08-11 | Discharge: 2020-08-11 | Disposition: A | Discharge status: Home / Self Care | Payer: Medicare Other | Source: Ambulatory Visit | Attending: Radiation Oncology | Admitting: Radiation Oncology

## 2020-08-11 DIAGNOSIS — Z5111 Encounter for antineoplastic chemotherapy: Secondary | ICD-10-CM | POA: Diagnosis not present

## 2020-08-14 ENCOUNTER — Inpatient Hospital Stay: Payer: Medicare Other

## 2020-08-14 ENCOUNTER — Other Ambulatory Visit: Payer: Self-pay

## 2020-08-14 ENCOUNTER — Ambulatory Visit
Admission: RE | Admit: 2020-08-14 | Discharge: 2020-08-14 | Disposition: A | Discharge status: Home / Self Care | Payer: Medicare Other | Source: Ambulatory Visit | Attending: Radiation Oncology | Admitting: Radiation Oncology

## 2020-08-14 VITALS — BP 114/79 | HR 72 | Temp 98.1°F | Resp 18

## 2020-08-14 DIAGNOSIS — Z5111 Encounter for antineoplastic chemotherapy: Secondary | ICD-10-CM | POA: Diagnosis not present

## 2020-08-14 DIAGNOSIS — C3490 Malignant neoplasm of unspecified part of unspecified bronchus or lung: Secondary | ICD-10-CM

## 2020-08-14 DIAGNOSIS — C3491 Malignant neoplasm of unspecified part of right bronchus or lung: Secondary | ICD-10-CM

## 2020-08-14 LAB — CMP (CANCER CENTER ONLY)
ALT: 18 U/L (ref 0–44)
AST: 14 U/L — ABNORMAL LOW (ref 15–41)
Albumin: 3.1 g/dL — ABNORMAL LOW (ref 3.5–5.0)
Alkaline Phosphatase: 85 U/L (ref 38–126)
Anion gap: 9 (ref 5–15)
BUN: 37 mg/dL — ABNORMAL HIGH (ref 8–23)
CO2: 21 mmol/L — ABNORMAL LOW (ref 22–32)
Calcium: 9.3 mg/dL (ref 8.9–10.3)
Chloride: 111 mmol/L (ref 98–111)
Creatinine: 1.89 mg/dL — ABNORMAL HIGH (ref 0.61–1.24)
GFR, Estimated: 37 mL/min — ABNORMAL LOW (ref >60)
Glucose, Bld: 103 mg/dL — ABNORMAL HIGH (ref 70–99)
Potassium: 4.2 mmol/L (ref 3.5–5.1)
Sodium: 141 mmol/L (ref 135–145)
Total Bilirubin: 0.3 mg/dL (ref 0.3–1.2)
Total Protein: 6.7 g/dL (ref 6.5–8.1)

## 2020-08-14 LAB — CBC WITH DIFFERENTIAL (CANCER CENTER ONLY)
Abs Immature Granulocytes: 0.01 10*3/uL (ref 0.00–0.07)
Basophils Absolute: 0 10*3/uL (ref 0.0–0.1)
Basophils Relative: 0 %
Eosinophils Absolute: 0.1 10*3/uL (ref 0.0–0.5)
Eosinophils Relative: 3 %
HCT: 32.3 % — ABNORMAL LOW (ref 39.0–52.0)
Hemoglobin: 10.8 g/dL — ABNORMAL LOW (ref 13.0–17.0)
Immature Granulocytes: 0 %
Lymphocytes Relative: 24 %
Lymphs Abs: 0.7 10*3/uL (ref 0.7–4.0)
MCH: 29.8 pg (ref 26.0–34.0)
MCHC: 33.4 g/dL (ref 30.0–36.0)
MCV: 89.2 fL (ref 80.0–100.0)
Monocytes Absolute: 0.3 10*3/uL (ref 0.1–1.0)
Monocytes Relative: 10 %
Neutro Abs: 1.8 10*3/uL (ref 1.7–7.7)
Neutrophils Relative %: 63 %
Platelet Count: 180 10*3/uL (ref 150–400)
RBC: 3.62 MIL/uL — ABNORMAL LOW (ref 4.22–5.81)
RDW: 15.2 % (ref 11.5–15.5)
WBC Count: 2.9 10*3/uL — ABNORMAL LOW (ref 4.0–10.5)
nRBC: 0 % (ref 0.0–0.2)

## 2020-08-14 MED ORDER — SODIUM CHLORIDE 0.9 % IV SOLN
45.0000 mg/m2 | Freq: Once | INTRAVENOUS | Status: AC
  Administered 2020-08-14: 96 mg via INTRAVENOUS
  Filled 2020-08-14: qty 16

## 2020-08-14 MED ORDER — FAMOTIDINE 20 MG IN NS 100 ML IVPB
INTRAVENOUS | Status: AC
  Filled 2020-08-14: qty 100

## 2020-08-14 MED ORDER — SODIUM CHLORIDE 0.9 % IV SOLN
140.0000 mg | Freq: Once | INTRAVENOUS | Status: AC
  Administered 2020-08-14: 140 mg via INTRAVENOUS
  Filled 2020-08-14: qty 14

## 2020-08-14 MED ORDER — SODIUM CHLORIDE 0.9 % IV SOLN
20.0000 mg | Freq: Once | INTRAVENOUS | Status: AC
  Administered 2020-08-14: 20 mg via INTRAVENOUS
  Filled 2020-08-14: qty 20

## 2020-08-14 MED ORDER — FAMOTIDINE 20 MG IN NS 100 ML IVPB
20.0000 mg | Freq: Once | INTRAVENOUS | Status: AC
  Administered 2020-08-14: 20 mg via INTRAVENOUS

## 2020-08-14 MED ORDER — PALONOSETRON HCL INJECTION 0.25 MG/5ML
0.2500 mg | Freq: Once | INTRAVENOUS | Status: AC
  Administered 2020-08-14: 0.25 mg via INTRAVENOUS

## 2020-08-14 MED ORDER — DIPHENHYDRAMINE HCL 50 MG/ML IJ SOLN
INTRAMUSCULAR | Status: AC
  Filled 2020-08-14: qty 1

## 2020-08-14 MED ORDER — DIPHENHYDRAMINE HCL 50 MG/ML IJ SOLN
50.0000 mg | Freq: Once | INTRAMUSCULAR | Status: AC
  Administered 2020-08-14: 50 mg via INTRAVENOUS

## 2020-08-14 MED ORDER — SODIUM CHLORIDE 0.9 % IV SOLN
Freq: Once | INTRAVENOUS | Status: AC
  Filled 2020-08-14: qty 250

## 2020-08-14 MED ORDER — PALONOSETRON HCL INJECTION 0.25 MG/5ML
INTRAVENOUS | Status: AC
  Filled 2020-08-14: qty 5

## 2020-08-14 NOTE — Progress Notes (Signed)
Okay to treat with creat 1.89 per Dr. Julien Nordmann

## 2020-08-15 ENCOUNTER — Ambulatory Visit
Admission: RE | Admit: 2020-08-15 | Discharge: 2020-08-15 | Disposition: A | Discharge status: Home / Self Care | Payer: Medicare Other | Source: Ambulatory Visit | Attending: Radiation Oncology | Admitting: Radiation Oncology

## 2020-08-15 DIAGNOSIS — Z5111 Encounter for antineoplastic chemotherapy: Secondary | ICD-10-CM | POA: Diagnosis not present

## 2020-08-16 ENCOUNTER — Ambulatory Visit
Admission: RE | Admit: 2020-08-16 | Discharge: 2020-08-16 | Disposition: A | Discharge status: Home / Self Care | Payer: Medicare Other | Source: Ambulatory Visit | Attending: Radiation Oncology | Admitting: Radiation Oncology

## 2020-08-16 ENCOUNTER — Other Ambulatory Visit: Payer: Self-pay

## 2020-08-16 DIAGNOSIS — Z5111 Encounter for antineoplastic chemotherapy: Secondary | ICD-10-CM | POA: Diagnosis not present

## 2020-08-17 ENCOUNTER — Encounter: Payer: Self-pay | Admitting: Radiation Oncology

## 2020-08-17 ENCOUNTER — Ambulatory Visit
Admission: RE | Admit: 2020-08-17 | Discharge: 2020-08-17 | Disposition: A | Discharge status: Home / Self Care | Payer: Medicare Other | Source: Ambulatory Visit | Attending: Radiation Oncology | Admitting: Radiation Oncology

## 2020-08-17 DIAGNOSIS — Z5111 Encounter for antineoplastic chemotherapy: Secondary | ICD-10-CM | POA: Diagnosis not present

## 2020-08-31 ENCOUNTER — Telehealth: Payer: Self-pay | Admitting: *Deleted

## 2020-08-31 NOTE — Telephone Encounter (Signed)
CALLED PATIENT TO REMIND OF SIM FOR 09-01-20- ARRIVAL TIME- 9:45 AM @ [facility], LVM FOR A RETURN CALL

## 2020-09-01 ENCOUNTER — Telehealth: Payer: Self-pay | Admitting: Physician Assistant

## 2020-09-01 ENCOUNTER — Ambulatory Visit
Admission: RE | Admit: 2020-09-01 | Discharge: 2020-09-01 | Disposition: A | Discharge status: Home / Self Care | Payer: Medicare Other | Source: Ambulatory Visit | Attending: Radiation Oncology | Admitting: Radiation Oncology

## 2020-09-01 DIAGNOSIS — Z5112 Encounter for antineoplastic immunotherapy: Secondary | ICD-10-CM | POA: Diagnosis not present

## 2020-09-01 DIAGNOSIS — J849 Interstitial pulmonary disease, unspecified: Secondary | ICD-10-CM | POA: Diagnosis not present

## 2020-09-01 DIAGNOSIS — C61 Malignant neoplasm of prostate: Secondary | ICD-10-CM | POA: Insufficient documentation

## 2020-09-01 DIAGNOSIS — Z9221 Personal history of antineoplastic chemotherapy: Secondary | ICD-10-CM | POA: Diagnosis not present

## 2020-09-01 DIAGNOSIS — J432 Centrilobular emphysema: Secondary | ICD-10-CM | POA: Diagnosis not present

## 2020-09-01 DIAGNOSIS — I10 Essential (primary) hypertension: Secondary | ICD-10-CM | POA: Diagnosis not present

## 2020-09-01 DIAGNOSIS — Z79899 Other long term (current) drug therapy: Secondary | ICD-10-CM | POA: Diagnosis not present

## 2020-09-01 DIAGNOSIS — Z923 Personal history of irradiation: Secondary | ICD-10-CM | POA: Diagnosis not present

## 2020-09-01 DIAGNOSIS — J479 Bronchiectasis, uncomplicated: Secondary | ICD-10-CM | POA: Diagnosis not present

## 2020-09-01 DIAGNOSIS — C3411 Malignant neoplasm of upper lobe, right bronchus or lung: Secondary | ICD-10-CM | POA: Diagnosis present

## 2020-09-01 DIAGNOSIS — I700 Atherosclerosis of aorta: Secondary | ICD-10-CM | POA: Diagnosis not present

## 2020-09-01 DIAGNOSIS — K769 Liver disease, unspecified: Secondary | ICD-10-CM | POA: Diagnosis not present

## 2020-09-01 NOTE — Telephone Encounter (Signed)
Radiation oncology alerted me that this patient's CT scan has not been scheduled. I tried to call the patient with scheduling instructions but I was unable to speak to him. I left a detailed voicemail with the number to radiology scheduling [phone]) and asked him to call them to schedule his scan before his follow up with Dr. Julien Nordmann on 09/06/20. I will try calling again later as well.

## 2020-09-01 NOTE — Progress Notes (Signed)
  Radiation Oncology         [id number]) [phone] ________________________________  Name: Jason [name]. MRN: [id number]  Date: 09/01/2020  DOB: 1950/12/29  SIMULATION AND TREATMENT PLANNING NOTE    ICD-10-CM   1. Malignant neoplasm of prostate ([place])  C61       DIAGNOSIS:  70 y.o. 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  NARRATIVE:  The patient was brought to the [facility].  Identity was confirmed.  All relevant records and images related to the planned course of therapy were reviewed.  The patient freely provided informed written consent to proceed with treatment after reviewing the details related to the planned course of therapy. The consent form was witnessed and verified by the simulation staff.  Then, the patient was set-up in a stable reproducible supine position for radiation therapy.  A vacuum lock pillow device was custom fabricated to position his legs in a reproducible immobilized position.  Then, I performed a urethrogram under sterile conditions to identify the prostatic apex.  CT images were obtained.  Surface markings were placed.  The CT images were loaded into the planning software.  Then the prostate target and avoidance structures including the rectum, bladder, bowel and hips were contoured.  Treatment planning then occurred.  The radiation prescription was entered and confirmed.  A total of one complex treatment devices was fabricated. I have requested : Intensity Modulated Radiotherapy (IMRT) is medically necessary for this case for the following reason:  Rectal sparing.Marland Kitchen  PLAN:  The patient will receive 45 Gy in 25 fractions of 1.8 Gy to the prostate, seminal vesicles, and pelvic lymph nodes followed by a boost to 75 Gy with 15 additional fractions of 2.0 Gy to the prostate  only.   ________________________________  Sheral Apley Tammi Klippel, M.D.

## 2020-09-04 ENCOUNTER — Inpatient Hospital Stay: Payer: Medicare Other | Attending: Internal Medicine

## 2020-09-04 ENCOUNTER — Other Ambulatory Visit: Payer: Self-pay

## 2020-09-04 ENCOUNTER — Ambulatory Visit (HOSPITAL_COMMUNITY)
Admission: RE | Admit: 2020-09-04 | Discharge: 2020-09-04 | Disposition: A | Discharge status: Home / Self Care | Payer: Medicare Other | Source: Ambulatory Visit | Attending: Internal Medicine | Admitting: Internal Medicine

## 2020-09-04 DIAGNOSIS — Z79899 Other long term (current) drug therapy: Secondary | ICD-10-CM | POA: Insufficient documentation

## 2020-09-04 DIAGNOSIS — Z923 Personal history of irradiation: Secondary | ICD-10-CM | POA: Insufficient documentation

## 2020-09-04 DIAGNOSIS — C3490 Malignant neoplasm of unspecified part of unspecified bronchus or lung: Secondary | ICD-10-CM | POA: Insufficient documentation

## 2020-09-04 DIAGNOSIS — C3411 Malignant neoplasm of upper lobe, right bronchus or lung: Secondary | ICD-10-CM | POA: Insufficient documentation

## 2020-09-04 DIAGNOSIS — K769 Liver disease, unspecified: Secondary | ICD-10-CM | POA: Insufficient documentation

## 2020-09-04 DIAGNOSIS — I700 Atherosclerosis of aorta: Secondary | ICD-10-CM | POA: Insufficient documentation

## 2020-09-04 DIAGNOSIS — J849 Interstitial pulmonary disease, unspecified: Secondary | ICD-10-CM | POA: Insufficient documentation

## 2020-09-04 DIAGNOSIS — Z5112 Encounter for antineoplastic immunotherapy: Secondary | ICD-10-CM | POA: Diagnosis not present

## 2020-09-04 DIAGNOSIS — I10 Essential (primary) hypertension: Secondary | ICD-10-CM | POA: Insufficient documentation

## 2020-09-04 DIAGNOSIS — J479 Bronchiectasis, uncomplicated: Secondary | ICD-10-CM | POA: Insufficient documentation

## 2020-09-04 DIAGNOSIS — Z9221 Personal history of antineoplastic chemotherapy: Secondary | ICD-10-CM | POA: Insufficient documentation

## 2020-09-04 DIAGNOSIS — C61 Malignant neoplasm of prostate: Secondary | ICD-10-CM | POA: Insufficient documentation

## 2020-09-04 DIAGNOSIS — J432 Centrilobular emphysema: Secondary | ICD-10-CM | POA: Insufficient documentation

## 2020-09-04 LAB — CBC WITH DIFFERENTIAL (CANCER CENTER ONLY)
Abs Immature Granulocytes: 0.02 10*3/uL (ref 0.00–0.07)
Basophils Absolute: 0 10*3/uL (ref 0.0–0.1)
Basophils Relative: 1 %
Eosinophils Absolute: 0.1 10*3/uL (ref 0.0–0.5)
Eosinophils Relative: 3 %
HCT: 33.3 % — ABNORMAL LOW (ref 39.0–52.0)
Hemoglobin: 10.9 g/dL — ABNORMAL LOW (ref 13.0–17.0)
Immature Granulocytes: 1 %
Lymphocytes Relative: 33 %
Lymphs Abs: 1.3 10*3/uL (ref 0.7–4.0)
MCH: 29.8 pg (ref 26.0–34.0)
MCHC: 32.7 g/dL (ref 30.0–36.0)
MCV: 91 fL (ref 80.0–100.0)
Monocytes Absolute: 0.8 10*3/uL (ref 0.1–1.0)
Monocytes Relative: 20 %
Neutro Abs: 1.7 10*3/uL (ref 1.7–7.7)
Neutrophils Relative %: 42 %
Platelet Count: 221 10*3/uL (ref 150–400)
RBC: 3.66 MIL/uL — ABNORMAL LOW (ref 4.22–5.81)
RDW: 17.1 % — ABNORMAL HIGH (ref 11.5–15.5)
WBC Count: 3.9 10*3/uL — ABNORMAL LOW (ref 4.0–10.5)
nRBC: 0 % (ref 0.0–0.2)

## 2020-09-04 LAB — CMP (CANCER CENTER ONLY)
ALT: 20 U/L (ref 0–44)
AST: 16 U/L (ref 15–41)
Albumin: 3.6 g/dL (ref 3.5–5.0)
Alkaline Phosphatase: 84 U/L (ref 38–126)
Anion gap: 10 (ref 5–15)
BUN: 36 mg/dL — ABNORMAL HIGH (ref 8–23)
CO2: 22 mmol/L (ref 22–32)
Calcium: 9.8 mg/dL (ref 8.9–10.3)
Chloride: 109 mmol/L (ref 98–111)
Creatinine: 1.96 mg/dL — ABNORMAL HIGH (ref 0.61–1.24)
GFR, Estimated: 36 mL/min — ABNORMAL LOW (ref >60)
Glucose, Bld: 91 mg/dL (ref 70–99)
Potassium: 4.1 mmol/L (ref 3.5–5.1)
Sodium: 141 mmol/L (ref 135–145)
Total Bilirubin: 0.5 mg/dL (ref 0.3–1.2)
Total Protein: 7.3 g/dL (ref 6.5–8.1)

## 2020-09-06 ENCOUNTER — Other Ambulatory Visit: Payer: Self-pay

## 2020-09-06 ENCOUNTER — Inpatient Hospital Stay (HOSPITAL_BASED_OUTPATIENT_CLINIC_OR_DEPARTMENT_OTHER): Payer: Medicare Other | Admitting: Internal Medicine

## 2020-09-06 ENCOUNTER — Encounter: Payer: Self-pay | Admitting: Internal Medicine

## 2020-09-06 VITALS — BP 91/69 | HR 109 | Temp 97.8°F | Resp 18 | Wt 192.5 lb

## 2020-09-06 DIAGNOSIS — R5382 Chronic fatigue, unspecified: Secondary | ICD-10-CM | POA: Diagnosis not present

## 2020-09-06 DIAGNOSIS — Z5112 Encounter for antineoplastic immunotherapy: Secondary | ICD-10-CM | POA: Diagnosis not present

## 2020-09-06 DIAGNOSIS — C3491 Malignant neoplasm of unspecified part of right bronchus or lung: Secondary | ICD-10-CM | POA: Diagnosis not present

## 2020-09-06 NOTE — Progress Notes (Signed)
DISCONTINUE ON PATHWAY REGIMEN - Non-Small Cell Lung     Administer weekly:     Paclitaxel      Carboplatin   **Always confirm dose/schedule in your pharmacy ordering system**  REASON: Continuation Of Treatment PRIOR TREATMENT: [id number]: Carboplatin AUC=2 + Paclitaxel 45 mg/m2 Weekly During Radiation TREATMENT RESPONSE: Partial Response (PR)  START ON PATHWAY REGIMEN - Non-Small Cell Lung     A cycle is every 28 days:     Durvalumab   **Always confirm dose/schedule in your pharmacy ordering system**  Patient Characteristics: Preoperative or Nonsurgical Candidate (Clinical Staging), Stage III - Nonsurgical Candidate (Nonsquamous and Squamous), PS = 0, 1 Therapeutic Status: Preoperative or Nonsurgical Candidate (Clinical Staging) AJCC T Category: cT1c AJCC N Category: cN2 AJCC M Category: cM0 AJCC 8 Stage Grouping: IIIA ECOG Performance Status: 1 Intent of Therapy: Curative Intent, Discussed with Patient

## 2020-09-06 NOTE — Progress Notes (Signed)
[facility] Telephone:(336) [phone]   Fax:(336) [place], MD [place] [address] [place] [address]  DIAGNOSIS:  1) Stage IIIa/c (T1c, N2/N3, M0) non-small cell lung cancer, squamous cell carcinoma presented with right upper lobe lung nodule in addition to subcarinal lymphadenopathy and suspicious AP window lymph node diagnosed in May 2022.  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   PRIOR THERAPY: Concurrent chemoradiation with weekly carboplatin for AUC of 2 and paclitaxel 45 Mg/M2. Status post 7 cycles. First dose on 07/04/20 and last cycle was giving August 14, 2020 with partial response.    CURRENT THERAPY: Consolidation treatment with immunotherapy with Imfinzi 1500 Mg IV every 4 weeks starting September 13, 2020.  INTERVAL HISTORY: Jason [name]. 70 y.o. male returns to the clinic today for follow-up visit accompanied by his wife.  The patient is feeling fine today with no concerning complaints.  He denied having any current chest pain, shortness of breath, cough or hemoptysis.  He denied having any dysphagia or odynophagia.  He has no nausea, vomiting, diarrhea or constipation.  He has no fever or chills.  He denied having any headache or visual changes.  He tolerated the previous course of concurrent chemoradiation fairly well.  The patient had repeat CT scan of the chest performed recently and he is here for evaluation and discussion of his treatment options.  MEDICAL HISTORY: Past Medical History:  Diagnosis Date   Arthritis    back   Hypertension    Prostate cancer ([place])    Tobacco use     ALLERGIES:  has No Known Allergies.  MEDICATIONS:  Current Outpatient Medications  Medication Sig Dispense Refill   acetaminophen (TYLENOL) 325 MG tablet Take 650 mg by mouth every 6 (six) hours as needed.     ALPRAZolam (XANAX) 0.5 MG tablet Take 0.5 mg by mouth daily as needed. (Patient  not taking: Reported on 08/07/2020)     atorvastatin (LIPITOR) 10 MG tablet Take 10 mg by mouth daily.     azelastine (ASTELIN) 0.1 % nasal spray Place 1 spray into both nostrils 2 (two) times daily as needed for rhinitis. Use in each nostril as directed     fluticasone (FLONASE) 50 MCG/ACT nasal spray Place 2 sprays into both nostrils daily.     ketoconazole (NIZORAL) 2 % shampoo  (Patient not taking: No sig reported)     levocetirizine (XYZAL) 5 MG tablet Take 5 mg by mouth every evening.     montelukast (SINGULAIR) 10 MG tablet Take 10 mg by mouth at bedtime.     prochlorperazine (COMPAZINE) 10 MG tablet Take 1 tablet (10 mg total) by mouth every 6 (six) hours as needed for nausea or vomiting. (Patient not taking: No sig reported) 30 tablet 0   valsartan-hydrochlorothiazide (DIOVAN-HCT) 320-25 MG tablet Take 1 tablet by mouth daily.     No current facility-administered medications for this visit.    SURGICAL HISTORY:  Past Surgical History:  Procedure Laterality Date   BACK SURGERY     BRONCHIAL BIOPSY  06/06/2020   Procedure: BRONCHIAL BIOPSIES;  Surgeon: Garner Nash, DO;  Location: [facility];  Service: Pulmonary;;   BRONCHIAL BRUSHINGS  06/06/2020   Procedure: BRONCHIAL BRUSHINGS;  Surgeon: Garner Nash, DO;  Location: [facility];  Service: Pulmonary;;   BRONCHIAL NEEDLE ASPIRATION BIOPSY  06/06/2020   Procedure: BRONCHIAL NEEDLE ASPIRATION BIOPSIES;  Surgeon: June Leap  L, DO;  Location: [facility];  Service: Pulmonary;;   BRONCHIAL WASHINGS  06/06/2020   Procedure: BRONCHIAL WASHINGS;  Surgeon: Garner Nash, DO;  Location: [facility];  Service: Pulmonary;;   COLONOSCOPY     OTHER SURGICAL HISTORY     fluid drawn off his testicle   PROSTATE BIOPSY     ROTATOR CUFF REPAIR Right    VIDEO BRONCHOSCOPY WITH ENDOBRONCHIAL NAVIGATION Right 06/06/2020   Procedure: VIDEO BRONCHOSCOPY WITH ENDOBRONCHIAL NAVIGATION;  Surgeon: Garner Nash, DO;  Location: [facility];  Service: Pulmonary;  Laterality: Right;   VIDEO BRONCHOSCOPY WITH ENDOBRONCHIAL ULTRASOUND Bilateral 06/06/2020   Procedure: VIDEO BRONCHOSCOPY WITH ENDOBRONCHIAL ULTRASOUND;  Surgeon: Garner Nash, DO;  Location: [facility];  Service: Pulmonary;  Laterality: Bilateral;   WRIST SURGERY      REVIEW OF SYSTEMS:  Constitutional: negative Eyes: negative Ears, nose, mouth, throat, and face: negative Respiratory: negative Cardiovascular: negative Gastrointestinal: negative Genitourinary:negative Integument/breast: negative Hematologic/lymphatic: negative Musculoskeletal:negative Neurological: negative Behavioral/Psych: negative Endocrine: negative Allergic/Immunologic: negative   PHYSICAL EXAMINATION: General appearance: alert, cooperative, and no distress Head: Normocephalic, without obvious abnormality, atraumatic Neck: no adenopathy, no JVD, supple, symmetrical, trachea midline, and thyroid not enlarged, symmetric, no tenderness/mass/nodules Lymph nodes: Cervical, supraclavicular, and axillary nodes normal. Resp: clear to auscultation bilaterally Back: symmetric, no curvature. ROM normal. No CVA tenderness. Cardio: regular rate and rhythm, S1, S2 normal, no murmur, click, rub or gallop GI: soft, non-tender; bowel sounds normal; no masses,  no organomegaly Extremities: extremities normal, atraumatic, no cyanosis or edema Neurologic: Alert and oriented X 3, normal strength and tone. Normal symmetric reflexes. Normal coordination and gait  ECOG PERFORMANCE STATUS: 1 - Symptomatic but completely ambulatory  Blood pressure 91/69, pulse (!) 109, temperature 97.8 F (36.6 C), temperature source Tympanic, resp. rate 18, weight 192 lb 8 oz (87.3 kg), SpO2 100 %.  LABORATORY DATA: Lab Results  Component Value Date   WBC 3.9 (L) 09/04/2020   HGB 10.9 (L) 09/04/2020   HCT 33.3 (L) 09/04/2020   MCV 91.0 09/04/2020   PLT 221 09/04/2020      Chemistry      Component  Value Date/Time   NA 141 09/04/2020 1601   K 4.1 09/04/2020 1601   CL 109 09/04/2020 1601   CO2 22 09/04/2020 1601   BUN 36 (H) 09/04/2020 1601   CREATININE 1.96 (H) 09/04/2020 1601      Component Value Date/Time   CALCIUM 9.8 09/04/2020 1601   ALKPHOS 84 09/04/2020 1601   AST 16 09/04/2020 1601   ALT 20 09/04/2020 1601   BILITOT 0.5 09/04/2020 1601       RADIOGRAPHIC STUDIES: CT Chest Wo Contrast  Result Date: 09/05/2020 CLINICAL DATA:  Non-small-cell lung cancer and prostate cancer diagnosed in April. Completed chemotherapy and radiation therapy 2 weeks ago. EXAM: CT CHEST WITHOUT CONTRAST TECHNIQUE: Multidetector CT imaging of the chest was performed following the standard protocol without IV contrast. COMPARISON:  06/02/2020 chest CT.  PET of 06/02/2020. FINDINGS: Cardiovascular: Aortic atherosclerosis. Tortuous thoracic aorta. Mild cardiomegaly, without pericardial effusion. Pulmonary artery enlargement, outflow tract 3.2 cm Mediastinum/Nodes: Right paratracheal node measures 9 mm on 57/2 versus 11 mm on the prior exam (when remeasured). AP window node measures 11 mm on 52/2 versus 14 mm on the prior. Hilar regions poorly evaluated without intravenous contrast. Lungs/Pleura: No pleural fluid. Mild to moderate centrilobular emphysema with similar interstitial lung disease at the bases, including architectural distortion and traction bronchiectasis. Spiculated right apical pulmonary nodule measures  2.0 x 1.7 cm on 26/7 (Previously 2.2 x 2.1 cm.) Upper Abdomen: Too small to characterize right hepatic lobe lesion is similar on the prior and likely a cyst. Normal imaged portions of the spleen, stomach, pancreas, gallbladder, kidneys. Mild bilateral adrenal thickening and low-density nodularity, favoring tiny adenomas. Musculoskeletal: Cervical spine fixation. IMPRESSION: 1. Response to therapy of right upper lobe cavitary pulmonary nodule. 2. Decrease in size of mediastinal nodes, which could be  metastatic or reactive. 3. No new or progressive disease. 4. Aortic atherosclerosis (ICD10-I70.0) and emphysema (ICD10-J43.9). 5. Similar interstitial lung disease, favor post infectious or inflammatory fibrosis. 6. Pulmonary artery enlargement suggests pulmonary arterial hypertension. Electronically Signed   By: Abigail Miyamoto M.D.   On: 09/05/2020 15:30    ASSESSMENT AND PLAN: This is a very pleasant 70 years old African-American male recently diagnosed with a stage IIIB (T1c, N3, M0) non-small cell lung cancer, squamous cell carcinoma presented with right upper lobe lung nodule in addition to subcarinal and suspicious AP window lymphadenopathy diagnosed in May 2022.  The patient also has a history of prostate adenocarcinoma diagnosed in April 2022. He underwent a course of concurrent chemoradiation with weekly carboplatin for AUC of 2 and paclitaxel 45 Mg/M2 status post 7 cycles.  The patient tolerated this course of concurrent chemoradiation fairly well with no concerning adverse effects. He had repeat CT scan of the chest performed recently.  I personally and independently reviewed the scan images and discussed the result and showed the images to the patient and his wife. His scan showed improvement of his disease with decrease in the size of the right upper lobe lung nodule in addition to decrease in the mediastinal and supraclavicular lymphadenopathy. I had a lengthy discussion with the patient about his current condition and treatment options. I gave the patient the option of observation and close monitoring versus proceeding with consolidation treatment with immunotherapy with Imfinzi 1500 Mg IV every 4 weeks.  The patient is interested in the treatment after discussion of the benefit and adverse effect of the immunotherapy.  I discussed with the patient the adverse effect of this treatment including but not limited to immunotherapy mediated skin rash, diarrhea, inflammation of the lung, kidney, liver,  thyroid or other endocrine dysfunction. He is expected to start the first cycle of this treatment next week. The patient will come back for follow-up visit in 5 weeks with the start of cycle #2. He was advised to call immediately if he has any concerning symptoms in the interval. The patient voices understanding of current disease status and treatment options and is in agreement with the current care plan.  All questions were answered. The patient knows to call the clinic with any problems, questions or concerns. We can certainly see the patient much sooner if necessary.  Disclaimer: This note was dictated with voice recognition software. Similar sounding words can inadvertently be transcribed and may not be corrected upon review.

## 2020-09-09 ENCOUNTER — Encounter: Payer: Self-pay | Admitting: Internal Medicine

## 2020-09-11 DIAGNOSIS — Z5112 Encounter for antineoplastic immunotherapy: Secondary | ICD-10-CM | POA: Diagnosis not present

## 2020-09-12 ENCOUNTER — Other Ambulatory Visit: Payer: Self-pay

## 2020-09-12 ENCOUNTER — Ambulatory Visit
Admission: RE | Admit: 2020-09-12 | Discharge: 2020-09-12 | Disposition: A | Discharge status: Home / Self Care | Payer: Medicare Other | Source: Ambulatory Visit | Attending: Radiation Oncology | Admitting: Radiation Oncology

## 2020-09-12 DIAGNOSIS — Z5112 Encounter for antineoplastic immunotherapy: Secondary | ICD-10-CM | POA: Diagnosis not present

## 2020-09-13 ENCOUNTER — Ambulatory Visit
Admission: RE | Admit: 2020-09-13 | Discharge: 2020-09-13 | Disposition: A | Discharge status: Home / Self Care | Payer: Medicare Other | Source: Ambulatory Visit | Attending: Radiation Oncology | Admitting: Radiation Oncology

## 2020-09-13 DIAGNOSIS — Z5112 Encounter for antineoplastic immunotherapy: Secondary | ICD-10-CM | POA: Diagnosis not present

## 2020-09-14 ENCOUNTER — Other Ambulatory Visit: Payer: Self-pay

## 2020-09-14 ENCOUNTER — Ambulatory Visit
Admission: RE | Admit: 2020-09-14 | Discharge: 2020-09-14 | Disposition: A | Discharge status: Home / Self Care | Payer: Medicare Other | Source: Ambulatory Visit | Attending: Radiation Oncology | Admitting: Radiation Oncology

## 2020-09-14 DIAGNOSIS — Z5112 Encounter for antineoplastic immunotherapy: Secondary | ICD-10-CM | POA: Diagnosis not present

## 2020-09-15 ENCOUNTER — Ambulatory Visit
Admission: RE | Admit: 2020-09-15 | Discharge: 2020-09-15 | Disposition: A | Discharge status: Home / Self Care | Payer: Medicare Other | Source: Ambulatory Visit | Attending: Radiation Oncology | Admitting: Radiation Oncology

## 2020-09-15 DIAGNOSIS — Z5112 Encounter for antineoplastic immunotherapy: Secondary | ICD-10-CM | POA: Diagnosis not present

## 2020-09-18 ENCOUNTER — Ambulatory Visit
Admission: RE | Admit: 2020-09-18 | Discharge: 2020-09-18 | Disposition: A | Discharge status: Home / Self Care | Payer: Medicare Other | Source: Ambulatory Visit | Attending: Radiation Oncology | Admitting: Radiation Oncology

## 2020-09-18 ENCOUNTER — Other Ambulatory Visit: Payer: Self-pay

## 2020-09-18 ENCOUNTER — Inpatient Hospital Stay: Payer: Medicare Other

## 2020-09-18 DIAGNOSIS — Z5112 Encounter for antineoplastic immunotherapy: Secondary | ICD-10-CM | POA: Diagnosis not present

## 2020-09-18 DIAGNOSIS — C3491 Malignant neoplasm of unspecified part of right bronchus or lung: Secondary | ICD-10-CM

## 2020-09-18 DIAGNOSIS — R5382 Chronic fatigue, unspecified: Secondary | ICD-10-CM

## 2020-09-18 LAB — CBC WITH DIFFERENTIAL (CANCER CENTER ONLY)
Abs Immature Granulocytes: 0.02 10*3/uL (ref 0.00–0.07)
Basophils Absolute: 0 10*3/uL (ref 0.0–0.1)
Basophils Relative: 0 %
Eosinophils Absolute: 0.3 10*3/uL (ref 0.0–0.5)
Eosinophils Relative: 10 %
HCT: 30.5 % — ABNORMAL LOW (ref 39.0–52.0)
Hemoglobin: 10.1 g/dL — ABNORMAL LOW (ref 13.0–17.0)
Immature Granulocytes: 1 %
Lymphocytes Relative: 40 %
Lymphs Abs: 1.1 10*3/uL (ref 0.7–4.0)
MCH: 30.2 pg (ref 26.0–34.0)
MCHC: 33.1 g/dL (ref 30.0–36.0)
MCV: 91.3 fL (ref 80.0–100.0)
Monocytes Absolute: 0.4 10*3/uL (ref 0.1–1.0)
Monocytes Relative: 14 %
Neutro Abs: 0.9 10*3/uL — ABNORMAL LOW (ref 1.7–7.7)
Neutrophils Relative %: 35 %
Platelet Count: 210 10*3/uL (ref 150–400)
RBC: 3.34 MIL/uL — ABNORMAL LOW (ref 4.22–5.81)
RDW: 17.7 % — ABNORMAL HIGH (ref 11.5–15.5)
WBC Count: 2.7 10*3/uL — ABNORMAL LOW (ref 4.0–10.5)
nRBC: 0 % (ref 0.0–0.2)

## 2020-09-18 LAB — CMP (CANCER CENTER ONLY)
ALT: 26 U/L (ref 0–44)
AST: 20 U/L (ref 15–41)
Albumin: 3.4 g/dL — ABNORMAL LOW (ref 3.5–5.0)
Alkaline Phosphatase: 74 U/L (ref 38–126)
Anion gap: 10 (ref 5–15)
BUN: 35 mg/dL — ABNORMAL HIGH (ref 8–23)
CO2: 24 mmol/L (ref 22–32)
Calcium: 9.1 mg/dL (ref 8.9–10.3)
Chloride: 105 mmol/L (ref 98–111)
Creatinine: 2.03 mg/dL — ABNORMAL HIGH (ref 0.61–1.24)
GFR, Estimated: 34 mL/min — ABNORMAL LOW (ref >60)
Glucose, Bld: 87 mg/dL (ref 70–99)
Potassium: 4.4 mmol/L (ref 3.5–5.1)
Sodium: 139 mmol/L (ref 135–145)
Total Bilirubin: 0.5 mg/dL (ref 0.3–1.2)
Total Protein: 7.1 g/dL (ref 6.5–8.1)

## 2020-09-19 ENCOUNTER — Ambulatory Visit
Admission: RE | Admit: 2020-09-19 | Discharge: 2020-09-19 | Disposition: A | Discharge status: Home / Self Care | Payer: Medicare Other | Source: Ambulatory Visit | Attending: Radiation Oncology | Admitting: Radiation Oncology

## 2020-09-19 ENCOUNTER — Telehealth: Payer: Self-pay | Admitting: Medical Oncology

## 2020-09-19 DIAGNOSIS — Z5112 Encounter for antineoplastic immunotherapy: Secondary | ICD-10-CM | POA: Diagnosis not present

## 2020-09-19 LAB — TSH: TSH: 0.662 u[IU]/mL (ref 0.320–4.118)

## 2020-09-19 NOTE — Telephone Encounter (Signed)
Pt confirmed appt for tomorrow.  Per Dr Julien Nordmann ,it is okay to treat pt tomorrow with Imfinzi and serum creatinine 2.03 and ANC =0.9  from 09/18/20.

## 2020-09-20 ENCOUNTER — Encounter: Payer: Self-pay | Admitting: Internal Medicine

## 2020-09-20 ENCOUNTER — Other Ambulatory Visit: Payer: Self-pay

## 2020-09-20 ENCOUNTER — Inpatient Hospital Stay: Payer: Medicare Other

## 2020-09-20 ENCOUNTER — Ambulatory Visit
Admission: RE | Admit: 2020-09-20 | Discharge: 2020-09-20 | Disposition: A | Discharge status: Home / Self Care | Payer: Medicare Other | Source: Ambulatory Visit | Attending: Radiation Oncology | Admitting: Radiation Oncology

## 2020-09-20 VITALS — BP 120/78 | HR 75 | Temp 98.1°F | Resp 18 | Wt 192.2 lb

## 2020-09-20 DIAGNOSIS — C3491 Malignant neoplasm of unspecified part of right bronchus or lung: Secondary | ICD-10-CM

## 2020-09-20 DIAGNOSIS — Z5112 Encounter for antineoplastic immunotherapy: Secondary | ICD-10-CM | POA: Diagnosis not present

## 2020-09-20 MED ORDER — SODIUM CHLORIDE 0.9 % IV SOLN: Freq: Once | INTRAVENOUS | Status: AC

## 2020-09-20 MED ORDER — SODIUM CHLORIDE 0.9 % IV SOLN
1500.0000 mg | Freq: Once | INTRAVENOUS | Status: AC
  Administered 2020-09-20: 1500 mg via INTRAVENOUS
  Filled 2020-09-20: qty 30

## 2020-09-20 NOTE — Patient Instructions (Signed)
[facility]  Discharge Instructions: Thank you for choosing [facility] to provide your oncology and hematology care.   If you have a lab appointment with the Cancer Center, please go directly to the Cancer Center and check in at the registration area.   Wear comfortable clothing and clothing appropriate for easy access to any Portacath or PICC line.   We strive to give you quality time with your provider. You may need to reschedule your appointment if you arrive late (15 or more minutes).  Arriving late affects you and other patients whose appointments are after yours.  Also, if you miss three or more appointments without notifying the office, you may be dismissed from the clinic at the provider's discretion.      For prescription refill requests, have your pharmacy contact our office and allow 72 hours for refills to be completed.    Today you received the following chemotherapy and/or immunotherapy agents imfinzi       To help prevent nausea and vomiting after your treatment, we encourage you to take your nausea medication as directed.  BELOW ARE SYMPTOMS THAT SHOULD BE REPORTED IMMEDIATELY: *FEVER GREATER THAN 100.4 F (38 C) OR HIGHER *CHILLS OR SWEATING *NAUSEA AND VOMITING THAT IS NOT CONTROLLED WITH YOUR NAUSEA MEDICATION *UNUSUAL SHORTNESS OF BREATH *UNUSUAL BRUISING OR BLEEDING *URINARY PROBLEMS (pain or burning when urinating, or frequent urination) *BOWEL PROBLEMS (unusual diarrhea, constipation, pain near the anus) TENDERNESS IN MOUTH AND THROAT WITH OR WITHOUT PRESENCE OF ULCERS (sore throat, sores in mouth, or a toothache) UNUSUAL RASH, SWELLING OR PAIN  UNUSUAL VAGINAL DISCHARGE OR ITCHING   Items with * indicate a potential emergency and should be followed up as soon as possible or go to the Emergency Department if any problems should occur.  Please show the CHEMOTHERAPY ALERT CARD or IMMUNOTHERAPY ALERT CARD at check-in to  the Emergency Department and triage nurse.  Should you have questions after your visit or need to cancel or reschedule your appointment, please contact [facility]  Dept: [phone]  and follow the prompts.  Office hours are 8:00 a.m. to 4:30 p.m. Monday - Friday. Please note that voicemails left after 4:00 p.m. may not be returned until the following business day.  We are closed weekends and major holidays. You have access to a nurse at all times for urgent questions. Please call the main number to the clinic Dept: [phone] and follow the prompts.   For any non-urgent questions, you may also contact your provider using MyChart. We now offer e-Visits for anyone 18 and older to request care online for non-urgent symptoms. For details visit [email or web address].   Also download the MyChart app! Go to the app store, search "MyChart", open the app, select [facility], and log in with your MyChart username and password.  Due to Covid, a mask is required upon entering the hospital/clinic. If you do not have a mask, one will be given to you upon arrival. For doctor visits, patients may have 1 support person aged 18 or older with them. For treatment visits, patients cannot have anyone with them due to current Covid guidelines and our immunocompromised population.   

## 2020-09-21 ENCOUNTER — Ambulatory Visit
Admission: RE | Admit: 2020-09-21 | Discharge: 2020-09-21 | Disposition: A | Discharge status: Home / Self Care | Payer: Medicare Other | Source: Ambulatory Visit | Attending: Radiation Oncology | Admitting: Radiation Oncology

## 2020-09-21 ENCOUNTER — Other Ambulatory Visit: Payer: Self-pay

## 2020-09-21 DIAGNOSIS — Z5112 Encounter for antineoplastic immunotherapy: Secondary | ICD-10-CM | POA: Diagnosis not present

## 2020-09-22 ENCOUNTER — Ambulatory Visit
Admission: RE | Admit: 2020-09-22 | Discharge: 2020-09-22 | Disposition: A | Discharge status: Home / Self Care | Payer: Medicare Other | Source: Ambulatory Visit | Attending: Radiation Oncology | Admitting: Radiation Oncology

## 2020-09-22 ENCOUNTER — Other Ambulatory Visit: Payer: Self-pay

## 2020-09-22 DIAGNOSIS — Z5112 Encounter for antineoplastic immunotherapy: Secondary | ICD-10-CM | POA: Diagnosis not present

## 2020-09-25 ENCOUNTER — Ambulatory Visit
Admission: RE | Admit: 2020-09-25 | Discharge: 2020-09-25 | Disposition: A | Discharge status: Home / Self Care | Payer: Medicare Other | Source: Ambulatory Visit | Attending: Radiation Oncology | Admitting: Radiation Oncology

## 2020-09-25 ENCOUNTER — Other Ambulatory Visit: Payer: Self-pay

## 2020-09-25 DIAGNOSIS — Z5112 Encounter for antineoplastic immunotherapy: Secondary | ICD-10-CM | POA: Diagnosis not present

## 2020-09-26 ENCOUNTER — Other Ambulatory Visit: Payer: Self-pay | Admitting: Nephrology

## 2020-09-26 ENCOUNTER — Ambulatory Visit
Admission: RE | Admit: 2020-09-26 | Discharge: 2020-09-26 | Disposition: A | Discharge status: Home / Self Care | Payer: Medicare Other | Source: Ambulatory Visit | Attending: Radiation Oncology | Admitting: Radiation Oncology

## 2020-09-26 DIAGNOSIS — N1832 Chronic kidney disease, stage 3b: Secondary | ICD-10-CM

## 2020-09-26 DIAGNOSIS — Z5112 Encounter for antineoplastic immunotherapy: Secondary | ICD-10-CM | POA: Diagnosis not present

## 2020-09-27 ENCOUNTER — Ambulatory Visit
Admission: RE | Admit: 2020-09-27 | Discharge: 2020-09-27 | Disposition: A | Discharge status: Home / Self Care | Payer: Medicare Other | Source: Ambulatory Visit | Attending: Radiation Oncology | Admitting: Radiation Oncology

## 2020-09-27 ENCOUNTER — Other Ambulatory Visit: Payer: Self-pay

## 2020-09-27 DIAGNOSIS — Z5112 Encounter for antineoplastic immunotherapy: Secondary | ICD-10-CM | POA: Diagnosis not present

## 2020-09-28 ENCOUNTER — Ambulatory Visit
Admission: RE | Admit: 2020-09-28 | Discharge: 2020-09-28 | Disposition: A | Discharge status: Home / Self Care | Payer: Medicare Other | Source: Ambulatory Visit | Attending: Radiation Oncology | Admitting: Radiation Oncology

## 2020-09-28 DIAGNOSIS — Z79899 Other long term (current) drug therapy: Secondary | ICD-10-CM | POA: Diagnosis not present

## 2020-09-28 DIAGNOSIS — R590 Localized enlarged lymph nodes: Secondary | ICD-10-CM | POA: Insufficient documentation

## 2020-09-28 DIAGNOSIS — C61 Malignant neoplasm of prostate: Secondary | ICD-10-CM | POA: Insufficient documentation

## 2020-09-28 DIAGNOSIS — C3411 Malignant neoplasm of upper lobe, right bronchus or lung: Secondary | ICD-10-CM | POA: Insufficient documentation

## 2020-09-28 DIAGNOSIS — Z5112 Encounter for antineoplastic immunotherapy: Secondary | ICD-10-CM | POA: Insufficient documentation

## 2020-09-29 ENCOUNTER — Encounter: Payer: Self-pay | Admitting: Internal Medicine

## 2020-09-29 ENCOUNTER — Ambulatory Visit
Admission: RE | Admit: 2020-09-29 | Discharge: 2020-09-29 | Disposition: A | Discharge status: Home / Self Care | Payer: Medicare Other | Source: Ambulatory Visit | Attending: Radiation Oncology | Admitting: Radiation Oncology

## 2020-09-29 ENCOUNTER — Ambulatory Visit
Admission: RE | Admit: 2020-09-29 | Discharge: 2020-09-29 | Disposition: A | Discharge status: Home / Self Care | Payer: Medicare Other | Source: Ambulatory Visit | Attending: Nephrology | Admitting: Nephrology

## 2020-09-29 ENCOUNTER — Other Ambulatory Visit: Payer: Self-pay

## 2020-09-29 DIAGNOSIS — Z5112 Encounter for antineoplastic immunotherapy: Secondary | ICD-10-CM | POA: Diagnosis not present

## 2020-09-29 DIAGNOSIS — N1832 Chronic kidney disease, stage 3b: Secondary | ICD-10-CM

## 2020-10-03 ENCOUNTER — Ambulatory Visit
Admission: RE | Admit: 2020-10-03 | Discharge: 2020-10-03 | Disposition: A | Discharge status: Home / Self Care | Payer: Medicare Other | Source: Ambulatory Visit | Attending: Radiation Oncology | Admitting: Radiation Oncology

## 2020-10-03 ENCOUNTER — Other Ambulatory Visit: Payer: Self-pay

## 2020-10-03 DIAGNOSIS — Z5112 Encounter for antineoplastic immunotherapy: Secondary | ICD-10-CM | POA: Diagnosis not present

## 2020-10-04 ENCOUNTER — Ambulatory Visit
Admission: RE | Admit: 2020-10-04 | Discharge: 2020-10-04 | Disposition: A | Discharge status: Home / Self Care | Payer: Medicare Other | Source: Ambulatory Visit | Attending: Radiation Oncology | Admitting: Radiation Oncology

## 2020-10-04 DIAGNOSIS — Z5112 Encounter for antineoplastic immunotherapy: Secondary | ICD-10-CM | POA: Diagnosis not present

## 2020-10-05 ENCOUNTER — Other Ambulatory Visit: Payer: Self-pay

## 2020-10-05 ENCOUNTER — Ambulatory Visit
Admission: RE | Admit: 2020-10-05 | Discharge: 2020-10-05 | Disposition: A | Discharge status: Home / Self Care | Payer: Medicare Other | Source: Ambulatory Visit | Attending: Radiation Oncology | Admitting: Radiation Oncology

## 2020-10-05 DIAGNOSIS — Z5112 Encounter for antineoplastic immunotherapy: Secondary | ICD-10-CM | POA: Diagnosis not present

## 2020-10-06 ENCOUNTER — Ambulatory Visit
Admission: RE | Admit: 2020-10-06 | Discharge: 2020-10-06 | Disposition: A | Discharge status: Home / Self Care | Payer: Medicare Other | Source: Ambulatory Visit | Attending: Radiation Oncology | Admitting: Radiation Oncology

## 2020-10-06 DIAGNOSIS — Z5112 Encounter for antineoplastic immunotherapy: Secondary | ICD-10-CM | POA: Diagnosis not present

## 2020-10-09 ENCOUNTER — Other Ambulatory Visit: Payer: Self-pay

## 2020-10-09 ENCOUNTER — Ambulatory Visit
Admission: RE | Admit: 2020-10-09 | Discharge: 2020-10-09 | Disposition: A | Discharge status: Home / Self Care | Payer: Medicare Other | Source: Ambulatory Visit | Attending: Radiation Oncology | Admitting: Radiation Oncology

## 2020-10-09 DIAGNOSIS — Z5112 Encounter for antineoplastic immunotherapy: Secondary | ICD-10-CM | POA: Diagnosis not present

## 2020-10-10 ENCOUNTER — Ambulatory Visit
Admission: RE | Admit: 2020-10-10 | Discharge: 2020-10-10 | Disposition: A | Discharge status: Home / Self Care | Payer: Medicare Other | Source: Ambulatory Visit | Attending: Radiation Oncology | Admitting: Radiation Oncology

## 2020-10-10 DIAGNOSIS — Z5112 Encounter for antineoplastic immunotherapy: Secondary | ICD-10-CM | POA: Diagnosis not present

## 2020-10-11 ENCOUNTER — Ambulatory Visit: Payer: Medicare Other

## 2020-10-11 ENCOUNTER — Ambulatory Visit: Payer: Medicare Other | Admitting: Internal Medicine

## 2020-10-11 ENCOUNTER — Other Ambulatory Visit: Payer: Medicare Other

## 2020-10-11 ENCOUNTER — Ambulatory Visit
Admission: RE | Admit: 2020-10-11 | Discharge: 2020-10-11 | Disposition: A | Discharge status: Home / Self Care | Payer: Medicare Other | Source: Ambulatory Visit | Attending: Radiation Oncology | Admitting: Radiation Oncology

## 2020-10-11 ENCOUNTER — Other Ambulatory Visit: Payer: Self-pay

## 2020-10-11 DIAGNOSIS — Z5112 Encounter for antineoplastic immunotherapy: Secondary | ICD-10-CM | POA: Diagnosis not present

## 2020-10-12 ENCOUNTER — Ambulatory Visit
Admission: RE | Admit: 2020-10-12 | Discharge: 2020-10-12 | Disposition: A | Discharge status: Home / Self Care | Payer: Medicare Other | Source: Ambulatory Visit | Attending: Radiation Oncology | Admitting: Radiation Oncology

## 2020-10-12 DIAGNOSIS — Z5112 Encounter for antineoplastic immunotherapy: Secondary | ICD-10-CM | POA: Diagnosis not present

## 2020-10-13 ENCOUNTER — Other Ambulatory Visit: Payer: Self-pay

## 2020-10-13 ENCOUNTER — Ambulatory Visit
Admission: RE | Admit: 2020-10-13 | Discharge: 2020-10-13 | Disposition: A | Discharge status: Home / Self Care | Payer: Medicare Other | Source: Ambulatory Visit | Attending: Radiation Oncology | Admitting: Radiation Oncology

## 2020-10-13 DIAGNOSIS — Z5112 Encounter for antineoplastic immunotherapy: Secondary | ICD-10-CM | POA: Diagnosis not present

## 2020-10-16 ENCOUNTER — Other Ambulatory Visit: Payer: Self-pay

## 2020-10-16 ENCOUNTER — Ambulatory Visit
Admission: RE | Admit: 2020-10-16 | Discharge: 2020-10-16 | Disposition: A | Discharge status: Home / Self Care | Payer: Medicare Other | Source: Ambulatory Visit | Attending: Radiation Oncology | Admitting: Radiation Oncology

## 2020-10-16 DIAGNOSIS — Z5112 Encounter for antineoplastic immunotherapy: Secondary | ICD-10-CM | POA: Diagnosis not present

## 2020-10-16 NOTE — Progress Notes (Signed)
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  PRIOR THERAPY: Concurrent chemoradiation with weekly carboplatin for AUC of 2 and paclitaxel 45 Mg/M2. Status post 7 cycles. First dose on 07/04/20 and last cycle was giving August 14, 2020 with partial response.   CURRENT THERAPY:  Consolidation treatment with immunotherapy with Imfinzi 1500 Mg IV every 4 weeks starting September 13, 2020. Status post 1 cycle.   INTERVAL HISTORY: Jason [name]. 70 y.o. male returns to the clinic for a follow up visit. The patient is feeling well today without any concerning complaints. The patient is status post his first cycle of consolidation immunotherapy with imfinzi and tolerated it well without any concerning adverse side effects except he had one episode of diarrhea upon returning home from his first infusion that resolved with pepto bismol and imodium. Denies any recurrent diarrhea. Denies blood in the stool or abdominal pain. Denies any fever, chills, or weight loss. He has some occasional night sweats associated with his treatment for his prostate cancer for which he sees urology. Denies any chest pain, shortness of breath, cough, or hemoptysis. Denies any nausea, vomiting, diarrhea, or constipation. Denies any headache or visual changes. Denies any rashes or skin changes. He took his anti-hypertensive today. He was instructed to take his anti-hypertensive by his PCP if his systolic BP is >809. The patient is here today for evaluation prior to starting cycle # 2   MEDICAL HISTORY: Past Medical History:  Diagnosis Date   Arthritis     back   Hypertension    Prostate cancer ([place])    Tobacco use     ALLERGIES:  has No Known Allergies.  MEDICATIONS:  Current Outpatient Medications  Medication Sig Dispense Refill   acetaminophen (TYLENOL) 325 MG tablet Take 650 mg by mouth every 6 (six) hours as needed.     ALPRAZolam (XANAX) 0.5 MG tablet Take 0.5 mg by mouth daily as needed. (Patient not taking: Reported on 09/06/2020)     atorvastatin (LIPITOR) 10 MG tablet Take 10 mg by mouth daily.     azelastine (ASTELIN) 0.1 % nasal spray Place 1 spray into both nostrils 2 (two) times daily as needed for rhinitis. Use in each nostril as directed     fluticasone (FLONASE) 50 MCG/ACT nasal spray Place 2 sprays into both nostrils daily.     ketoconazole (NIZORAL) 2 % shampoo  (Patient not taking: Reported on 09/06/2020)     levocetirizine (XYZAL) 5 MG tablet Take 5 mg by mouth every evening.     montelukast (SINGULAIR) 10 MG tablet Take 10 mg by mouth at bedtime.     prochlorperazine (COMPAZINE) 10 MG tablet Take 1 tablet (10 mg total) by mouth every 6 (six) hours as needed for nausea or vomiting. (Patient not taking: Reported on 09/06/2020) 30 tablet 0   valsartan-hydrochlorothiazide (DIOVAN-HCT) 320-25 MG tablet Take 1 tablet by mouth daily.     No current facility-administered medications for this visit.    SURGICAL HISTORY:  Past Surgical History:  Procedure Laterality Date   BACK SURGERY     BRONCHIAL BIOPSY  06/06/2020   Procedure: BRONCHIAL BIOPSIES;  Surgeon: June Leap  L, DO;  Location: [facility];  Service: Pulmonary;;   BRONCHIAL BRUSHINGS  06/06/2020   Procedure: BRONCHIAL BRUSHINGS;  Surgeon: Garner Nash, DO;  Location: [facility];  Service: Pulmonary;;   BRONCHIAL NEEDLE ASPIRATION BIOPSY  06/06/2020   Procedure: BRONCHIAL NEEDLE ASPIRATION BIOPSIES;  Surgeon: Garner Nash, DO;  Location: [facility];  Service: Pulmonary;;   BRONCHIAL WASHINGS  06/06/2020   Procedure: BRONCHIAL WASHINGS;  Surgeon:  Garner Nash, DO;  Location: [facility];  Service: Pulmonary;;   COLONOSCOPY     OTHER SURGICAL HISTORY     fluid drawn off his testicle   PROSTATE BIOPSY     ROTATOR CUFF REPAIR Right    VIDEO BRONCHOSCOPY WITH ENDOBRONCHIAL NAVIGATION Right 06/06/2020   Procedure: VIDEO BRONCHOSCOPY WITH ENDOBRONCHIAL NAVIGATION;  Surgeon: Garner Nash, DO;  Location: [facility];  Service: Pulmonary;  Laterality: Right;   VIDEO BRONCHOSCOPY WITH ENDOBRONCHIAL ULTRASOUND Bilateral 06/06/2020   Procedure: VIDEO BRONCHOSCOPY WITH ENDOBRONCHIAL ULTRASOUND;  Surgeon: Garner Nash, DO;  Location: [facility];  Service: Pulmonary;  Laterality: Bilateral;   WRIST SURGERY      REVIEW OF SYSTEMS:   Review of Systems  Constitutional: Negative for appetite change, chills, fatigue, fever and unexpected weight change.  HENT: Negative for mouth sores, nosebleeds, sore throat and trouble swallowing.   Eyes: Negative for eye problems and icterus.  Respiratory: Negative for cough, hemoptysis, shortness of breath and wheezing.   Cardiovascular: Negative for chest pain and leg swelling.  Gastrointestinal: Negative for abdominal pain, constipation, diarrhea, nausea and vomiting.  Genitourinary: Negative for bladder incontinence, difficulty urinating, dysuria, frequency and hematuria.   Musculoskeletal: Negative for back pain, gait problem, neck pain and neck stiffness.  Skin: Negative for itching and rash.  Neurological: Negative for dizziness, extremity weakness, gait problem, headaches, light-headedness and seizures.  Hematological: Negative for adenopathy. Does not bruise/bleed easily.  Psychiatric/Behavioral: Negative for confusion, depression and sleep disturbance. The patient is not nervous/anxious.     PHYSICAL EXAMINATION:  Blood pressure 140/88, pulse 80, temperature 98.9 F (37.2 C), resp. rate 20, weight 193 lb 1.6 oz (87.6 kg), SpO2 98 %.  ECOG PERFORMANCE STATUS: 1  Physical Exam   Constitutional: Oriented to person, place, and time and well-developed, well-nourished, and in no distress.  HENT:  Head: Normocephalic and atraumatic.  Mouth/Throat: Oropharynx is clear and moist. No oropharyngeal exudate.  Eyes: Conjunctivae are normal. Right eye exhibits no discharge. Left eye exhibits no discharge. No scleral icterus.  Neck: Normal range of motion. Neck supple.  Cardiovascular: Normal rate, regular rhythm, normal heart sounds and intact distal pulses.   Pulmonary/Chest: Effort normal and breath sounds normal. No respiratory distress. No wheezes. No rales.  Abdominal: Soft. Bowel sounds are normal. Exhibits no distension and no mass. There is no tenderness.  Musculoskeletal: Normal range of motion. Exhibits no edema.  Lymphadenopathy:    No cervical adenopathy.  Neurological: Alert and oriented to person, place, and time. Exhibits normal muscle tone. Gait normal. Coordination normal.  Skin: Skin is warm and dry. No rash noted. Not diaphoretic. No erythema. No pallor.  Psychiatric: Mood, memory and judgment normal.  Vitals reviewed.  LABORATORY DATA: Lab Results  Component Value Date   WBC 3.8 (L) 10/19/2020   HGB 9.9 (L) 10/19/2020   HCT 29.2 (L) 10/19/2020   MCV 93.6 10/19/2020   PLT 214 10/19/2020      Chemistry      Component Value Date/Time   NA 138 10/19/2020 1003   K  3.8 10/19/2020 1003   CL 108 10/19/2020 1003   CO2 22 10/19/2020 1003   BUN 13 10/19/2020 1003   CREATININE 1.50 (H) 10/19/2020 1003      Component Value Date/Time   CALCIUM 9.8 10/19/2020 1003   ALKPHOS 72 10/19/2020 1003   AST 17 10/19/2020 1003   ALT 14 10/19/2020 1003   BILITOT 0.5 10/19/2020 1003       RADIOGRAPHIC STUDIES:  US RENAL  Result Date: 10/18/2020 CLINICAL DATA:  CKD stage 3 EXAM: RENAL / URINARY TRACT ULTRASOUND COMPLETE COMPARISON:  None. FINDINGS: Right Kidney: Renal measurements: 10.6 x 4.3 x 5.6 cm = volume: 139 mL. Echogenicity within normal limits. No  hydronephrosis. There is an exophytic cyst in the superior pole measuring 1.0 cm. Left Kidney: Renal measurements: 10.1 x 5.6 x 5.3 cm = volume: 157 mL. Echogenicity within normal limits. No mass or hydronephrosis visualized. Bladder: Appears normal for degree of bladder distention. Other: None. IMPRESSION: Small cyst in the superior pole the right kidney. Otherwise unremarkable appearance of the bilateral kidneys. Electronically Signed   By: Audie Pinto M.D.   On: 10-18-20 17:30     ASSESSMENT/PLAN:  This is a very pleasant 70 year old African-American male recently diagnosed with stage IIIa/C (T1c, N2-N3, M0) non-small cell lung cancer, squamous cell carcinoma.  He presented with a right upper lobe lung nodule in addition to subcarinal lymphadenopathy and suspicious AP window lymph node involvement.  He was diagnosed in May 2022.  Also diagnosed with prostate adenocarcinoma with a Gleason score of 9.  He was diagnosed in April 2022.  The patient completed concurrent chemoradiation with carboplatin for an AUC of 2 and paclitaxel 45 mg per metered squared.  He is status post 7 cycles and tolerated it well.   The patient is currently undergoing consolidation immunotherapy with Imfinzi 1500 mg IV every 4 weeks.  He is status post 1 cycle and tolerated it well.  Labs were reviewed.  Recommend that he proceed with cycle #2 today scheduled. He knows to take imodium if he develops diarrhea and to call us if persistent diarrhea.   We will see him back for follow-up visit in 4 weeks for evaluation before we start cycle #3.  He is asymptomatic from his hypotension. He took his valsartan-HCTZ this morning. We will arrange for him to receive 1 L of fluid while in the infusion room today. I also recommended that the patient reach out to his PCP about dose reduction or removing one of his BP meds from his regimen given his hypotension.   The patient was advised to call immediately if he has any concerning  symptoms in the interval. The patient voices understanding of current disease status and treatment options and is in agreement with the current care plan. All questions were answered. The patient knows to call the clinic with any problems, questions or concerns. We can certainly see the patient much sooner if necessary         No orders of the defined types were placed in this encounter.   The total time spent in the appointment was 20-29 minutes.   Don Giarrusso L [place], PA-C 10/19/20

## 2020-10-17 ENCOUNTER — Ambulatory Visit
Admission: RE | Admit: 2020-10-17 | Discharge: 2020-10-17 | Disposition: A | Discharge status: Home / Self Care | Payer: Medicare Other | Source: Ambulatory Visit | Attending: Radiation Oncology | Admitting: Radiation Oncology

## 2020-10-17 DIAGNOSIS — Z5112 Encounter for antineoplastic immunotherapy: Secondary | ICD-10-CM | POA: Diagnosis not present

## 2020-10-18 ENCOUNTER — Other Ambulatory Visit: Payer: Self-pay

## 2020-10-18 ENCOUNTER — Encounter: Payer: Self-pay | Admitting: General Practice

## 2020-10-18 ENCOUNTER — Encounter: Payer: Self-pay | Admitting: Internal Medicine

## 2020-10-18 ENCOUNTER — Ambulatory Visit
Admission: RE | Admit: 2020-10-18 | Discharge: 2020-10-18 | Disposition: A | Discharge status: Home / Self Care | Payer: Medicare Other | Source: Ambulatory Visit | Attending: Radiation Oncology | Admitting: Radiation Oncology

## 2020-10-18 DIAGNOSIS — Z5112 Encounter for antineoplastic immunotherapy: Secondary | ICD-10-CM | POA: Diagnosis not present

## 2020-10-18 NOTE — Progress Notes (Signed)
Pt changed his mind and wanted to apply for the [facility] so I gave him the income requirement and unfortunately he's over qualified for the grant at this time.

## 2020-10-18 NOTE — Progress Notes (Signed)
[facility] CSW Progress Notes  Call from Lew Dawes, spouse. She would like options for patient to apply for additional financial resources.  Reports financial difficulties due to loss of income as a result of his treatment.  CSW suggested calling Cancer [facility] for their annual grant.  Tomorrow, CSW will meet w patient in infusion to discuss [employer] gas card program and [employer].  Edwyna Shell, LCSW Clinical Social Worker Phone:  [phone]

## 2020-10-19 ENCOUNTER — Inpatient Hospital Stay: Payer: Medicare Other | Attending: Internal Medicine | Admitting: Physician Assistant

## 2020-10-19 ENCOUNTER — Inpatient Hospital Stay: Payer: Medicare Other | Admitting: General Practice

## 2020-10-19 ENCOUNTER — Inpatient Hospital Stay: Payer: Medicare Other

## 2020-10-19 ENCOUNTER — Ambulatory Visit
Admission: RE | Admit: 2020-10-19 | Discharge: 2020-10-19 | Disposition: A | Discharge status: Home / Self Care | Payer: Medicare Other | Source: Ambulatory Visit | Attending: Radiation Oncology | Admitting: Radiation Oncology

## 2020-10-19 VITALS — BP 140/88 | HR 80 | Temp 98.9°F | Resp 20 | Wt 193.1 lb

## 2020-10-19 DIAGNOSIS — Z5112 Encounter for antineoplastic immunotherapy: Secondary | ICD-10-CM

## 2020-10-19 DIAGNOSIS — C3491 Malignant neoplasm of unspecified part of right bronchus or lung: Secondary | ICD-10-CM

## 2020-10-19 DIAGNOSIS — C61 Malignant neoplasm of prostate: Secondary | ICD-10-CM | POA: Insufficient documentation

## 2020-10-19 DIAGNOSIS — C3411 Malignant neoplasm of upper lobe, right bronchus or lung: Secondary | ICD-10-CM | POA: Insufficient documentation

## 2020-10-19 DIAGNOSIS — R5382 Chronic fatigue, unspecified: Secondary | ICD-10-CM

## 2020-10-19 DIAGNOSIS — I959 Hypotension, unspecified: Secondary | ICD-10-CM | POA: Insufficient documentation

## 2020-10-19 DIAGNOSIS — R590 Localized enlarged lymph nodes: Secondary | ICD-10-CM | POA: Insufficient documentation

## 2020-10-19 DIAGNOSIS — Z79899 Other long term (current) drug therapy: Secondary | ICD-10-CM | POA: Insufficient documentation

## 2020-10-19 LAB — CBC WITH DIFFERENTIAL (CANCER CENTER ONLY)
Abs Immature Granulocytes: 0.01 10*3/uL (ref 0.00–0.07)
Basophils Absolute: 0 10*3/uL (ref 0.0–0.1)
Basophils Relative: 1 %
Eosinophils Absolute: 0.4 10*3/uL (ref 0.0–0.5)
Eosinophils Relative: 11 %
HCT: 29.2 % — ABNORMAL LOW (ref 39.0–52.0)
Hemoglobin: 9.9 g/dL — ABNORMAL LOW (ref 13.0–17.0)
Immature Granulocytes: 0 %
Lymphocytes Relative: 13 %
Lymphs Abs: 0.5 10*3/uL — ABNORMAL LOW (ref 0.7–4.0)
MCH: 31.7 pg (ref 26.0–34.0)
MCHC: 33.9 g/dL (ref 30.0–36.0)
MCV: 93.6 fL (ref 80.0–100.0)
Monocytes Absolute: 0.6 10*3/uL (ref 0.1–1.0)
Monocytes Relative: 15 %
Neutro Abs: 2.3 10*3/uL (ref 1.7–7.7)
Neutrophils Relative %: 60 %
Platelet Count: 214 10*3/uL (ref 150–400)
RBC: 3.12 MIL/uL — ABNORMAL LOW (ref 4.22–5.81)
RDW: 17.5 % — ABNORMAL HIGH (ref 11.5–15.5)
WBC Count: 3.8 10*3/uL — ABNORMAL LOW (ref 4.0–10.5)
nRBC: 0 % (ref 0.0–0.2)

## 2020-10-19 LAB — CMP (CANCER CENTER ONLY)
ALT: 14 U/L (ref 0–44)
AST: 17 U/L (ref 15–41)
Albumin: 3.6 g/dL (ref 3.5–5.0)
Alkaline Phosphatase: 72 U/L (ref 38–126)
Anion gap: 8 (ref 5–15)
BUN: 13 mg/dL (ref 8–23)
CO2: 22 mmol/L (ref 22–32)
Calcium: 9.8 mg/dL (ref 8.9–10.3)
Chloride: 108 mmol/L (ref 98–111)
Creatinine: 1.5 mg/dL — ABNORMAL HIGH (ref 0.61–1.24)
GFR, Estimated: 49 mL/min — ABNORMAL LOW (ref >60)
Glucose, Bld: 99 mg/dL (ref 70–99)
Potassium: 3.8 mmol/L (ref 3.5–5.1)
Sodium: 138 mmol/L (ref 135–145)
Total Bilirubin: 0.5 mg/dL (ref 0.3–1.2)
Total Protein: 7.1 g/dL (ref 6.5–8.1)

## 2020-10-19 LAB — TSH: TSH: 0.557 u[IU]/mL (ref 0.320–4.118)

## 2020-10-19 MED ORDER — SODIUM CHLORIDE 0.9 % IV SOLN
1500.0000 mg | Freq: Once | INTRAVENOUS | Status: AC
  Administered 2020-10-19: 1500 mg via INTRAVENOUS
  Filled 2020-10-19: qty 30

## 2020-10-19 MED ORDER — SODIUM CHLORIDE 0.9 % IV SOLN: Freq: Once | INTRAVENOUS | Status: AC

## 2020-10-19 NOTE — Progress Notes (Signed)
Per Cassie, PA, ok to treat with elevated Scr.

## 2020-10-19 NOTE — Progress Notes (Signed)
[facility] CSW Progress Notes  Met w patient in infusion after speaking w wife yesterday.  He relates his frustration with finances - prior to diagnosis, he was working job he enjoyed at [employer].  Was forced to quit due to illness and need for treatment.  This has greatly reduced their household income and he is having to make adjustments.  Per wife, he was over income for [employer].  Provided [employer] gas card program application, will submit that today.  Enrolled in [place] and provided first disbursement.  He can contact me for additional needs.  Edwyna Shell, LCSW Clinical Social Worker Phone:  [phone]

## 2020-10-19 NOTE — Patient Instructions (Signed)
[facility]  Discharge Instructions: Thank you for choosing [facility] to provide your oncology and hematology care.   If you have a lab appointment with the Cancer Center, please go directly to the Cancer Center and check in at the registration area.   Wear comfortable clothing and clothing appropriate for easy access to any Portacath or PICC line.   We strive to give you quality time with your provider. You may need to reschedule your appointment if you arrive late (15 or more minutes).  Arriving late affects you and other patients whose appointments are after yours.  Also, if you miss three or more appointments without notifying the office, you may be dismissed from the clinic at the provider's discretion.      For prescription refill requests, have your pharmacy contact our office and allow 72 hours for refills to be completed.    Today you received the following chemotherapy and/or immunotherapy agents imfinzi       To help prevent nausea and vomiting after your treatment, we encourage you to take your nausea medication as directed.  BELOW ARE SYMPTOMS THAT SHOULD BE REPORTED IMMEDIATELY: *FEVER GREATER THAN 100.4 F (38 C) OR HIGHER *CHILLS OR SWEATING *NAUSEA AND VOMITING THAT IS NOT CONTROLLED WITH YOUR NAUSEA MEDICATION *UNUSUAL SHORTNESS OF BREATH *UNUSUAL BRUISING OR BLEEDING *URINARY PROBLEMS (pain or burning when urinating, or frequent urination) *BOWEL PROBLEMS (unusual diarrhea, constipation, pain near the anus) TENDERNESS IN MOUTH AND THROAT WITH OR WITHOUT PRESENCE OF ULCERS (sore throat, sores in mouth, or a toothache) UNUSUAL RASH, SWELLING OR PAIN  UNUSUAL VAGINAL DISCHARGE OR ITCHING   Items with * indicate a potential emergency and should be followed up as soon as possible or go to the Emergency Department if any problems should occur.  Please show the CHEMOTHERAPY ALERT CARD or IMMUNOTHERAPY ALERT CARD at check-in to  the Emergency Department and triage nurse.  Should you have questions after your visit or need to cancel or reschedule your appointment, please contact [facility]  Dept: [phone]  and follow the prompts.  Office hours are 8:00 a.m. to 4:30 p.m. Monday - Friday. Please note that voicemails left after 4:00 p.m. may not be returned until the following business day.  We are closed weekends and major holidays. You have access to a nurse at all times for urgent questions. Please call the main number to the clinic Dept: [phone] and follow the prompts.   For any non-urgent questions, you may also contact your provider using MyChart. We now offer e-Visits for anyone 18 and older to request care online for non-urgent symptoms. For details visit [email or web address].   Also download the MyChart app! Go to the app store, search "MyChart", open the app, select , and log in with your MyChart username and password.  Due to Covid, a mask is required upon entering the hospital/clinic. If you do not have a mask, one will be given to you upon arrival. For doctor visits, patients may have 1 support person aged 18 or older with them. For treatment visits, patients cannot have anyone with them due to current Covid guidelines and our immunocompromised population.   

## 2020-10-20 ENCOUNTER — Ambulatory Visit
Admission: RE | Admit: 2020-10-20 | Discharge: 2020-10-20 | Disposition: A | Discharge status: Home / Self Care | Payer: Medicare Other | Source: Ambulatory Visit | Attending: Radiation Oncology | Admitting: Radiation Oncology

## 2020-10-20 ENCOUNTER — Other Ambulatory Visit: Payer: Self-pay

## 2020-10-20 DIAGNOSIS — Z5112 Encounter for antineoplastic immunotherapy: Secondary | ICD-10-CM | POA: Diagnosis not present

## 2020-10-23 ENCOUNTER — Other Ambulatory Visit: Payer: Self-pay

## 2020-10-23 ENCOUNTER — Ambulatory Visit
Admission: RE | Admit: 2020-10-23 | Discharge: 2020-10-23 | Disposition: A | Discharge status: Home / Self Care | Payer: Medicare Other | Source: Ambulatory Visit | Attending: Radiation Oncology | Admitting: Radiation Oncology

## 2020-10-23 DIAGNOSIS — Z5112 Encounter for antineoplastic immunotherapy: Secondary | ICD-10-CM | POA: Diagnosis not present

## 2020-10-24 ENCOUNTER — Other Ambulatory Visit: Payer: Self-pay

## 2020-10-24 ENCOUNTER — Ambulatory Visit
Admission: RE | Admit: 2020-10-24 | Discharge: 2020-10-24 | Disposition: A | Discharge status: Home / Self Care | Payer: Medicare Other | Source: Ambulatory Visit | Attending: Radiation Oncology | Admitting: Radiation Oncology

## 2020-10-24 DIAGNOSIS — Z5112 Encounter for antineoplastic immunotherapy: Secondary | ICD-10-CM | POA: Diagnosis not present

## 2020-10-25 ENCOUNTER — Ambulatory Visit
Admission: RE | Admit: 2020-10-25 | Discharge: 2020-10-25 | Disposition: A | Discharge status: Home / Self Care | Payer: Medicare Other | Source: Ambulatory Visit | Attending: Radiation Oncology | Admitting: Radiation Oncology

## 2020-10-25 DIAGNOSIS — Z5112 Encounter for antineoplastic immunotherapy: Secondary | ICD-10-CM | POA: Diagnosis not present

## 2020-10-26 ENCOUNTER — Other Ambulatory Visit: Payer: Self-pay

## 2020-10-26 ENCOUNTER — Ambulatory Visit
Admission: RE | Admit: 2020-10-26 | Discharge: 2020-10-26 | Disposition: A | Discharge status: Home / Self Care | Payer: Medicare Other | Source: Ambulatory Visit | Attending: Radiation Oncology | Admitting: Radiation Oncology

## 2020-10-26 DIAGNOSIS — Z5112 Encounter for antineoplastic immunotherapy: Secondary | ICD-10-CM | POA: Diagnosis not present

## 2020-10-27 ENCOUNTER — Ambulatory Visit
Admission: RE | Admit: 2020-10-27 | Discharge: 2020-10-27 | Disposition: A | Discharge status: Home / Self Care | Payer: Medicare Other | Source: Ambulatory Visit | Attending: Radiation Oncology | Admitting: Radiation Oncology

## 2020-10-27 DIAGNOSIS — Z5112 Encounter for antineoplastic immunotherapy: Secondary | ICD-10-CM | POA: Diagnosis not present

## 2020-10-30 ENCOUNTER — Other Ambulatory Visit: Payer: Self-pay

## 2020-10-30 ENCOUNTER — Ambulatory Visit
Admission: RE | Admit: 2020-10-30 | Discharge: 2020-10-30 | Disposition: A | Discharge status: Home / Self Care | Payer: Medicare Other | Source: Ambulatory Visit | Attending: Radiation Oncology | Admitting: Radiation Oncology

## 2020-10-30 DIAGNOSIS — C3411 Malignant neoplasm of upper lobe, right bronchus or lung: Secondary | ICD-10-CM | POA: Insufficient documentation

## 2020-10-30 DIAGNOSIS — C61 Malignant neoplasm of prostate: Secondary | ICD-10-CM | POA: Insufficient documentation

## 2020-10-31 ENCOUNTER — Ambulatory Visit
Admission: RE | Admit: 2020-10-31 | Discharge: 2020-10-31 | Disposition: A | Discharge status: Home / Self Care | Payer: Medicare Other | Source: Ambulatory Visit | Attending: Radiation Oncology | Admitting: Radiation Oncology

## 2020-10-31 DIAGNOSIS — C61 Malignant neoplasm of prostate: Secondary | ICD-10-CM | POA: Diagnosis not present

## 2020-11-01 ENCOUNTER — Ambulatory Visit
Admission: RE | Admit: 2020-11-01 | Discharge: 2020-11-01 | Disposition: A | Discharge status: Home / Self Care | Payer: Medicare Other | Source: Ambulatory Visit | Attending: Radiation Oncology | Admitting: Radiation Oncology

## 2020-11-01 ENCOUNTER — Other Ambulatory Visit: Payer: Self-pay

## 2020-11-01 ENCOUNTER — Telehealth: Payer: Self-pay | Admitting: *Deleted

## 2020-11-01 DIAGNOSIS — C61 Malignant neoplasm of prostate: Secondary | ICD-10-CM | POA: Diagnosis not present

## 2020-11-01 NOTE — Telephone Encounter (Signed)
RETURNED PATIENT'S PHONE CALL, SPOKE WITH PATIENT. ?

## 2020-11-02 ENCOUNTER — Ambulatory Visit
Admission: RE | Admit: 2020-11-02 | Discharge: 2020-11-02 | Disposition: A | Discharge status: Home / Self Care | Payer: Medicare Other | Source: Ambulatory Visit | Attending: Radiation Oncology | Admitting: Radiation Oncology

## 2020-11-02 ENCOUNTER — Telehealth: Payer: Self-pay | Admitting: Internal Medicine

## 2020-11-02 DIAGNOSIS — C61 Malignant neoplasm of prostate: Secondary | ICD-10-CM | POA: Diagnosis not present

## 2020-11-02 NOTE — Telephone Encounter (Signed)
R/s appt per sch msg. Called and left msg with new date and time

## 2020-11-03 ENCOUNTER — Other Ambulatory Visit: Payer: Self-pay

## 2020-11-03 ENCOUNTER — Ambulatory Visit
Admission: RE | Admit: 2020-11-03 | Discharge: 2020-11-03 | Disposition: A | Discharge status: Home / Self Care | Payer: Medicare Other | Source: Ambulatory Visit | Attending: Radiation Oncology | Admitting: Radiation Oncology

## 2020-11-03 DIAGNOSIS — C61 Malignant neoplasm of prostate: Secondary | ICD-10-CM | POA: Diagnosis not present

## 2020-11-05 NOTE — Progress Notes (Signed)
  Radiation Oncology         [phone] ________________________________  Name: Jason [name]. MRN: [id number]  Date: 08/17/2020  DOB: [birth year]   End of Treatment Note  Diagnosis:   70 yo gentleman with T2N2M0, stage IIIA, NSCLC, squamous cell carcinoma of the right upper lobe lung and also  T1c adenocarcinoma of the prostate with Gleason 4+5 and PSA of 8.7     Indication for treatment:  Curative, Chemo-Radiotherapy for the lung cancer  Radiation treatment dates:   6/6-7/21/22  Site/dose:   The primary tumor and involved mediastinal adenopathy were treated to 66 Gy in 33 fractions of 2 Gy.  Beams/energy:   A five field 3D conformal treatment arrangement was used delivering 6 and 10 MV photons.  Daily image-guidance CT was used to align the treatment with the targeted volume  Narrative: The patient tolerated radiation treatment relatively well.  The patient experienced some esophagitis characterized as mild.  The patient also noted fatigue.  Plan: The patient has completed radiation treatment. The patient will return to radiation oncology clinic for routine followup in one month. I advised him to call or return sooner if he has any questions or concerns related to his recovery or treatment.  ________________________________  Sheral Apley. Tammi Klippel, M.D.

## 2020-11-06 ENCOUNTER — Ambulatory Visit
Admission: RE | Admit: 2020-11-06 | Discharge: 2020-11-06 | Disposition: A | Discharge status: Home / Self Care | Payer: Medicare Other | Source: Ambulatory Visit | Attending: Radiation Oncology | Admitting: Radiation Oncology

## 2020-11-06 ENCOUNTER — Other Ambulatory Visit: Payer: Self-pay

## 2020-11-06 DIAGNOSIS — C61 Malignant neoplasm of prostate: Secondary | ICD-10-CM | POA: Diagnosis not present

## 2020-11-06 NOTE — Progress Notes (Signed)
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  PRIOR THERAPY:  Concurrent chemoradiation with weekly carboplatin for AUC of 2 and paclitaxel 45 Mg/M2. Status post 7 cycles. First dose on 07/04/20 and last cycle was giving August 14, 2020 with partial response.   CURRENT THERAPY: Consolidation treatment with immunotherapy with Imfinzi 1500 Mg IV every 4 weeks starting September 13, 2020. Status post 2 cycles.   INTERVAL HISTORY: Jason [name]. 70 y.o. male returns to the clinic for a follow up visit. The patient is feeling well today without any concerning complaints except he notices nocturia and is wondering if that is a side effect of his chemotherapy, immunotherapy, or radiation. Of note, the patient has prostate cancer and states he has not seen Dr. Louis Meckel from urology. He is unsure how often he is supposed to get androgen deprivation. He does not have a follow up scheduled. Denies malodorous urine, denies dysuria, or cloudy urine.   Otherwise, he is tolerating his treatment well. Denies any recurrent diarrhea. Denies blood in the stool or abdominal pain. Denies any fever, chills, or weight loss. Denies any chest pain, shortness of breath, cough, or hemoptysis. Denies any nausea, vomiting, diarrhea, or constipation. Denies any headache or visual changes. Denies any rashes or skin changes. The patient is here today for evaluation prior to starting cycle #3  MEDICAL HISTORY: Past Medical History:  Diagnosis Date   Arthritis    back   Hypertension    Prostate cancer ([place])    Tobacco use     ALLERGIES:  has No  Known Allergies.  MEDICATIONS:  Current Outpatient Medications  Medication Sig Dispense Refill   acetaminophen (TYLENOL) 325 MG tablet Take 650 mg by mouth every 6 (six) hours as needed.     ALPRAZolam (XANAX) 0.5 MG tablet Take 0.5 mg by mouth daily as needed. (Patient not taking: Reported on 09/06/2020)     atorvastatin (LIPITOR) 10 MG tablet Take 10 mg by mouth daily.     azelastine (ASTELIN) 0.1 % nasal spray Place 1 spray into both nostrils 2 (two) times daily as needed for rhinitis. Use in each nostril as directed     fluticasone (FLONASE) 50 MCG/ACT nasal spray Place 2 sprays into both nostrils daily.     ketoconazole (NIZORAL) 2 % shampoo  (Patient not taking: Reported on 09/06/2020)     levocetirizine (XYZAL) 5 MG tablet Take 5 mg by mouth every evening.     montelukast (SINGULAIR) 10 MG tablet Take 10 mg by mouth at bedtime.     prochlorperazine (COMPAZINE) 10 MG tablet Take 1 tablet (10 mg total) by mouth every 6 (six) hours as needed for nausea or vomiting. (Patient not taking: Reported on 09/06/2020) 30 tablet 0   valsartan-hydrochlorothiazide (DIOVAN-HCT) 320-25 MG tablet Take 1 tablet by mouth daily.     No current facility-administered medications for this visit.   Facility-Administered Medications Ordered in Other Visits  Medication Dose Route Frequency Provider Last Rate Last Admin   durvalumab (IMFINZI) 1,500 mg in sodium chloride 0.9 % 100 mL chemo infusion  1,500 mg Intravenous Once Curt Bears, MD  SURGICAL HISTORY:  Past Surgical History:  Procedure Laterality Date   BACK SURGERY     BRONCHIAL BIOPSY  06/06/2020   Procedure: BRONCHIAL BIOPSIES;  Surgeon: Garner Nash, DO;  Location: [facility];  Service: Pulmonary;;   BRONCHIAL BRUSHINGS  06/06/2020   Procedure: BRONCHIAL BRUSHINGS;  Surgeon: Garner Nash, DO;  Location: [facility];  Service: Pulmonary;;   BRONCHIAL NEEDLE ASPIRATION BIOPSY  06/06/2020   Procedure: BRONCHIAL NEEDLE ASPIRATION  BIOPSIES;  Surgeon: Garner Nash, DO;  Location: [facility];  Service: Pulmonary;;   BRONCHIAL WASHINGS  06/06/2020   Procedure: BRONCHIAL WASHINGS;  Surgeon: Garner Nash, DO;  Location: [facility];  Service: Pulmonary;;   COLONOSCOPY     OTHER SURGICAL HISTORY     fluid drawn off his testicle   PROSTATE BIOPSY     ROTATOR CUFF REPAIR Right    VIDEO BRONCHOSCOPY WITH ENDOBRONCHIAL NAVIGATION Right 06/06/2020   Procedure: VIDEO BRONCHOSCOPY WITH ENDOBRONCHIAL NAVIGATION;  Surgeon: Garner Nash, DO;  Location: [facility];  Service: Pulmonary;  Laterality: Right;   VIDEO BRONCHOSCOPY WITH ENDOBRONCHIAL ULTRASOUND Bilateral 06/06/2020   Procedure: VIDEO BRONCHOSCOPY WITH ENDOBRONCHIAL ULTRASOUND;  Surgeon: Garner Nash, DO;  Location: [facility];  Service: Pulmonary;  Laterality: Bilateral;   WRIST SURGERY      REVIEW OF SYSTEMS:   Review of Systems  Constitutional: Negative for appetite change, chills, fatigue, fever and unexpected weight change.  HENT:   Negative for mouth sores, nosebleeds, sore throat and trouble swallowing.   Eyes: Negative for eye problems and icterus.  Respiratory: Negative for cough, hemoptysis, shortness of breath and wheezing.   Cardiovascular: Negative for chest pain and leg swelling.  Gastrointestinal: Negative for abdominal pain, constipation, diarrhea, nausea and vomiting.  Genitourinary: Positive for nocturia. Negative for bladder incontinence, difficulty urinating, dysuria, frequency and hematuria.   Musculoskeletal: Negative for back pain, gait problem, neck pain and neck stiffness.  Skin: Negative for itching and rash.  Neurological: Negative for dizziness, extremity weakness, gait problem, headaches, light-headedness and seizures.  Hematological: Negative for adenopathy. Does not bruise/bleed easily.  Psychiatric/Behavioral: Negative for confusion, depression and sleep disturbance. The patient is not nervous/anxious.     PHYSICAL  EXAMINATION:  Blood pressure (!) 141/96, pulse 76, temperature (!) 96.7 F (35.9 C), temperature source Tympanic, resp. rate 18, weight 193 lb 14.4 oz (88 kg), SpO2 99 %.  ECOG PERFORMANCE STATUS: 1  Physical Exam  Constitutional: Oriented to person, place, and time and well-developed, well-nourished, and in no distress. No distress.  HENT:  Head: Normocephalic and atraumatic.  Mouth/Throat: Oropharynx is clear and moist. No oropharyngeal exudate.  Eyes: Conjunctivae are normal. Right eye exhibits no discharge. Left eye exhibits no discharge. No scleral icterus.  Neck: Normal range of motion. Neck supple.  Cardiovascular: Normal rate, regular rhythm, normal heart sounds and intact distal pulses.   Pulmonary/Chest: Effort normal and breath sounds normal. No respiratory distress. No wheezes. No rales.  Abdominal: Soft. Bowel sounds are normal. Exhibits no distension and no mass. There is no tenderness.  Musculoskeletal: Normal range of motion. Exhibits no edema.  Lymphadenopathy:    No cervical adenopathy.  Neurological: Alert and oriented to person, place, and time. Exhibits normal muscle tone. Gait normal. Coordination normal.  Skin: Skin is warm and dry. No rash noted. Not diaphoretic. No erythema. No pallor.  Psychiatric: Mood, memory and judgment normal.  Vitals reviewed.  LABORATORY DATA: Lab Results  Component Value Date   WBC 3.8 (L) 11/15/2020  HGB 11.0 (L) 11/15/2020   HCT 32.4 (L) 11/15/2020   MCV 95.9 11/15/2020   PLT 219 11/15/2020      Chemistry      Component Value Date/Time   NA 139 11/15/2020 0946   K 4.0 11/15/2020 0946   CL 109 11/15/2020 0946   CO2 19 (L) 11/15/2020 0946   BUN 29 (H) 11/15/2020 0946   CREATININE 1.84 (H) 11/15/2020 0946      Component Value Date/Time   CALCIUM 9.8 11/15/2020 0946   ALKPHOS 70 11/15/2020 0946   AST 13 (L) 11/15/2020 0946   ALT 12 11/15/2020 0946   BILITOT 0.4 11/15/2020 0946       RADIOGRAPHIC STUDIES:  No  results found.   ASSESSMENT/PLAN:  This is a very pleasant 70 year old African-American male recently diagnosed with stage IIIa/C (T1c, N2-N3, M0) non-small cell lung cancer, squamous cell carcinoma.  He presented with a right upper lobe lung nodule in addition to subcarinal lymphadenopathy and suspicious AP window lymph node involvement.  He was diagnosed in May 2022.  Also diagnosed with prostate adenocarcinoma with a Gleason score of 9.  He was diagnosed in April 2022.   The patient completed concurrent chemoradiation with carboplatin for an AUC of 2 and paclitaxel 45 mg per metered squared.  He is status post 7 cycles and tolerated it well.    The patient is currently undergoing consolidation immunotherapy with Imfinzi 1500 mg IV every 4 weeks.  He is status post 2 cycles and tolerated it well.  Labs were reviewed.  Recommend that he proceed with cycle #3 today scheduled.   I will arrange for a restaging CT scan prior to starting the next cycle of treatment. I will arrange for this without contrast due to his CKD.  I will consult Dr. Julien Nordmann to see if it would be appropriate to follow up with urology and radiation oncology regarding treatment of his prostate cancer. We will check a UA just to ensure no infection, although does not sound like a UTI and his nocturia seems more consistent with enlarged prostate.   We will see him back for a follow up visit in 4 weeks for evaluation and to review his scan results before starting cycle #4.   The patient was advised to call immediately if he has any concerning symptoms in the interval. The patient voices understanding of current disease status and treatment options and is in agreement with the current care plan. All questions were answered. The patient knows to call the clinic with any problems, questions or concerns. We can certainly see the patient much sooner if necessary   Orders Placed This Encounter  Procedures   Urine Culture     Standing Status:   Future    Standing Expiration Date:   11/15/2021   CT Chest Wo Contrast    Standing Status:   Future    Standing Expiration Date:   11/15/2021    Order Specific Question:   Preferred imaging location?    Answer:   [facility]   Urinalysis, Complete w Microscopic    Standing Status:   Future    Standing Expiration Date:   11/15/2021      The total time spent in the appointment was 20-29 minutes   Gracilyn Gunia L Lelaina Oatis, PA-C 11/15/20

## 2020-11-07 ENCOUNTER — Ambulatory Visit: Payer: Medicare Other

## 2020-11-07 ENCOUNTER — Encounter: Payer: Self-pay | Admitting: Urology

## 2020-11-07 ENCOUNTER — Ambulatory Visit
Admission: RE | Admit: 2020-11-07 | Discharge: 2020-11-07 | Disposition: A | Discharge status: Home / Self Care | Payer: Medicare Other | Source: Ambulatory Visit | Attending: Radiation Oncology | Admitting: Radiation Oncology

## 2020-11-07 ENCOUNTER — Other Ambulatory Visit: Payer: Medicare Other

## 2020-11-07 ENCOUNTER — Ambulatory Visit: Payer: Medicare Other | Admitting: Internal Medicine

## 2020-11-07 DIAGNOSIS — C61 Malignant neoplasm of prostate: Secondary | ICD-10-CM | POA: Diagnosis not present

## 2020-11-15 ENCOUNTER — Telehealth: Payer: Self-pay | Admitting: Physician Assistant

## 2020-11-15 ENCOUNTER — Inpatient Hospital Stay (HOSPITAL_BASED_OUTPATIENT_CLINIC_OR_DEPARTMENT_OTHER): Payer: Medicare Other | Admitting: Physician Assistant

## 2020-11-15 ENCOUNTER — Inpatient Hospital Stay: Payer: Medicare Other | Attending: Internal Medicine

## 2020-11-15 ENCOUNTER — Inpatient Hospital Stay: Payer: Medicare Other

## 2020-11-15 ENCOUNTER — Other Ambulatory Visit: Payer: Self-pay

## 2020-11-15 VITALS — BP 141/96 | HR 76 | Temp 96.7°F | Resp 18 | Wt 193.9 lb

## 2020-11-15 DIAGNOSIS — C3411 Malignant neoplasm of upper lobe, right bronchus or lung: Secondary | ICD-10-CM | POA: Diagnosis present

## 2020-11-15 DIAGNOSIS — N1832 Chronic kidney disease, stage 3b: Secondary | ICD-10-CM | POA: Insufficient documentation

## 2020-11-15 DIAGNOSIS — C3491 Malignant neoplasm of unspecified part of right bronchus or lung: Secondary | ICD-10-CM

## 2020-11-15 DIAGNOSIS — I129 Hypertensive chronic kidney disease with stage 1 through stage 4 chronic kidney disease, or unspecified chronic kidney disease: Secondary | ICD-10-CM | POA: Diagnosis not present

## 2020-11-15 DIAGNOSIS — R351 Nocturia: Secondary | ICD-10-CM | POA: Insufficient documentation

## 2020-11-15 DIAGNOSIS — Z5112 Encounter for antineoplastic immunotherapy: Secondary | ICD-10-CM | POA: Diagnosis present

## 2020-11-15 DIAGNOSIS — Z79899 Other long term (current) drug therapy: Secondary | ICD-10-CM | POA: Diagnosis not present

## 2020-11-15 DIAGNOSIS — C61 Malignant neoplasm of prostate: Secondary | ICD-10-CM

## 2020-11-15 DIAGNOSIS — R5382 Chronic fatigue, unspecified: Secondary | ICD-10-CM

## 2020-11-15 LAB — CBC WITH DIFFERENTIAL (CANCER CENTER ONLY)
Abs Immature Granulocytes: 0.01 10*3/uL (ref 0.00–0.07)
Basophils Absolute: 0 10*3/uL (ref 0.0–0.1)
Basophils Relative: 1 %
Eosinophils Absolute: 0.4 10*3/uL (ref 0.0–0.5)
Eosinophils Relative: 11 %
HCT: 32.4 % — ABNORMAL LOW (ref 39.0–52.0)
Hemoglobin: 11 g/dL — ABNORMAL LOW (ref 13.0–17.0)
Immature Granulocytes: 0 %
Lymphocytes Relative: 21 %
Lymphs Abs: 0.8 10*3/uL (ref 0.7–4.0)
MCH: 32.5 pg (ref 26.0–34.0)
MCHC: 34 g/dL (ref 30.0–36.0)
MCV: 95.9 fL (ref 80.0–100.0)
Monocytes Absolute: 0.5 10*3/uL (ref 0.1–1.0)
Monocytes Relative: 14 %
Neutro Abs: 2 10*3/uL (ref 1.7–7.7)
Neutrophils Relative %: 53 %
Platelet Count: 219 10*3/uL (ref 150–400)
RBC: 3.38 MIL/uL — ABNORMAL LOW (ref 4.22–5.81)
RDW: 15.6 % — ABNORMAL HIGH (ref 11.5–15.5)
WBC Count: 3.8 10*3/uL — ABNORMAL LOW (ref 4.0–10.5)
nRBC: 0 % (ref 0.0–0.2)

## 2020-11-15 LAB — URINALYSIS, COMPLETE (UACMP) WITH MICROSCOPIC
Bacteria, UA: NONE SEEN
Bilirubin Urine: NEGATIVE
Glucose, UA: NEGATIVE mg/dL
Ketones, ur: NEGATIVE mg/dL
Leukocytes,Ua: NEGATIVE
Nitrite: NEGATIVE
Protein, ur: NEGATIVE mg/dL
Specific Gravity, Urine: 1.006 (ref 1.005–1.030)
pH: 5 (ref 5.0–8.0)

## 2020-11-15 LAB — CMP (CANCER CENTER ONLY)
ALT: 12 U/L (ref 0–44)
AST: 13 U/L — ABNORMAL LOW (ref 15–41)
Albumin: 3.8 g/dL (ref 3.5–5.0)
Alkaline Phosphatase: 70 U/L (ref 38–126)
Anion gap: 11 (ref 5–15)
BUN: 29 mg/dL — ABNORMAL HIGH (ref 8–23)
CO2: 19 mmol/L — ABNORMAL LOW (ref 22–32)
Calcium: 9.8 mg/dL (ref 8.9–10.3)
Chloride: 109 mmol/L (ref 98–111)
Creatinine: 1.84 mg/dL — ABNORMAL HIGH (ref 0.61–1.24)
GFR, Estimated: 39 mL/min — ABNORMAL LOW (ref >60)
Glucose, Bld: 107 mg/dL — ABNORMAL HIGH (ref 70–99)
Potassium: 4 mmol/L (ref 3.5–5.1)
Sodium: 139 mmol/L (ref 135–145)
Total Bilirubin: 0.4 mg/dL (ref 0.3–1.2)
Total Protein: 7.3 g/dL (ref 6.5–8.1)

## 2020-11-15 LAB — TSH: TSH: 0.313 u[IU]/mL — ABNORMAL LOW (ref 0.320–4.118)

## 2020-11-15 MED ORDER — SODIUM CHLORIDE 0.9 % IV SOLN: Freq: Once | INTRAVENOUS | Status: AC

## 2020-11-15 MED ORDER — SODIUM CHLORIDE 0.9 % IV SOLN
1500.0000 mg | Freq: Once | INTRAVENOUS | Status: AC
  Administered 2020-11-15: 1500 mg via INTRAVENOUS
  Filled 2020-11-15: qty 30

## 2020-11-15 NOTE — Telephone Encounter (Signed)
I attempted to call the patient to review the UA test results today which did not show any infection.  The patient told me that he has not undergone treatment for his prostate cancer but per chart review it appears that the patient recently completed radiation for this and has a 1 month follow-up scheduled in November.  I called the patient to discuss this in more detail and to clarify this with him.  Pending a call back.  I will also try and call the patient again tomorrow.

## 2020-11-15 NOTE — Patient Instructions (Signed)
[facility]COL[facility]   Discharge Instructions: Thank you for choosing [facility] to provide your oncology and hematology care.   If you have a lab appointment with the Cancer Center, please go directly to the Cancer Center and check in at the registration area.   Wear comfortable clothing and clothing appropriate for easy access to any Portacath or PICC line.   We strive to give you quality time with your provider. You may need to reschedule your appointment if you arrive late (15 or more minutes).  Arriving late affects you and other patients whose appointments are after yours.  Also, if you miss three or more appointments without notifying the office, you may be dismissed from the clinic at the provider's discretion.      For prescription refill requests, have your pharmacy contact our office and allow 72 hours for refills to be completed.    Today you received the following chemotherapy and/or immunotherapy agents: durvalumab.      To help prevent nausea and vomiting after your treatment, we encourage you to take your nausea medication as directed.  BELOW ARE SYMPTOMS THAT SHOULD BE REPORTED IMMEDIATELY: *FEVER GREATER THAN 100.4 F (38 C) OR HIGHER *CHILLS OR SWEATING *NAUSEA AND VOMITING THAT IS NOT CONTROLLED WITH YOUR NAUSEA MEDICATION *UNUSUAL SHORTNESS OF BREATH *UNUSUAL BRUISING OR BLEEDING *URINARY PROBLEMS (pain or burning when urinating, or frequent urination) *BOWEL PROBLEMS (unusual diarrhea, constipation, pain near the anus) TENDERNESS IN MOUTH AND THROAT WITH OR WITHOUT PRESENCE OF ULCERS (sore throat, sores in mouth, or a toothache) UNUSUAL RASH, SWELLING OR PAIN  UNUSUAL VAGINAL DISCHARGE OR ITCHING   Items with * indicate a potential emergency and should be followed up as soon as possible or go to the Emergency Department if any problems should occur.  Please show the CHEMOTHERAPY ALERT CARD or IMMUNOTHERAPY ALERT CARD at check-in  to the Emergency Department and triage nurse.  Should you have questions after your visit or need to cancel or reschedule your appointment, please contact [facility]  Dept: [phone]  and follow the prompts.  Office hours are 8:00 a.m. to 4:30 p.m. Monday - Friday. Please note that voicemails left after 4:00 p.m. may not be returned until the following business day.  We are closed weekends and major holidays. You have access to a nurse at all times for urgent questions. Please call the main number to the clinic Dept: [phone] and follow the prompts.   For any non-urgent questions, you may also contact your provider using MyChart. We now offer e-Visits for anyone 18 and older to request care online for non-urgent symptoms. For details visit [email or web address].   Also download the MyChart app! Go to the app store, search "MyChart", open the app, select [facility], and log in with your MyChart username and password.  Due to Covid, a mask is required upon entering the hospital/clinic. If you do not have a mask, one will be given to you upon arrival. For doctor visits, patients may have 1 support person aged 18 or older with them. For treatment visits, patients cannot have anyone with them due to current Covid guidelines and our immunocompromised population.   

## 2020-11-15 NOTE — Progress Notes (Signed)
Ok to treat today with elevated Scr per Cassandra Heilingoetter, PAC.

## 2020-11-16 NOTE — Telephone Encounter (Signed)
Pt has called back. He states he does recall having his prostate treated and is aware of his 2mo follow-up appt with [facility] next month. Pt has been advised his UA showed no infection. Pt states she will follow-up with his urologist.

## 2020-12-09 NOTE — Progress Notes (Signed)
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  PRIOR THERAPY: Concurrent chemoradiation with weekly carboplatin for AUC of 2 and paclitaxel 45 Mg/M2. Status post 7 cycles. First dose on 07/04/20 and last cycle was giving August 14, 2020 with partial response.   CURRENT THERAPY:  Consolidation treatment with immunotherapy with Imfinzi 1500 Mg IV every 4 weeks starting September 13, 2020. Status post 2 cycles.   INTERVAL HISTORY: Jason [name]. 70 y.o. male rreturns to the clinic for a follow up visit. The patient is feeling well today without any concerning complaints. He is tolerating his treatment well. Denies blood in the stool or abdominal pain. Denies recent diarrhea. Denies any fever, chills, or weight loss. Denies any chest pain, shortness of breath, cough, or hemoptysis. Denies any nausea, vomiting, diarrhea, or constipation. Denies any headache or visual changes. Denies any rashes or skin changes. The patient recently had a restaging CT scan performed.The patient is here today for evaluation and to review his scan results  prior to starting cycle #4  MEDICAL HISTORY: Past Medical History:  Diagnosis Date   Arthritis    back   Hypertension    Prostate cancer ([place])    Tobacco use     ALLERGIES:  has No Known Allergies.  MEDICATIONS:  Current Outpatient Medications  Medication Sig Dispense Refill   acetaminophen (TYLENOL) 325 MG tablet Take 650 mg by mouth every 6 (six) hours as needed.     atorvastatin (LIPITOR) 10 MG tablet Take 10 mg by mouth daily.     azelastine (ASTELIN) 0.1 % nasal spray Place 1  spray into both nostrils 2 (two) times daily as needed for rhinitis. Use in each nostril as directed     fluticasone (FLONASE) 50 MCG/ACT nasal spray Place 2 sprays into both nostrils daily.     levocetirizine (XYZAL) 5 MG tablet Take 5 mg by mouth every evening.     montelukast (SINGULAIR) 10 MG tablet Take 10 mg by mouth at bedtime.     valsartan-hydrochlorothiazide (DIOVAN-HCT) 320-25 MG tablet Take 1 tablet by mouth daily.     ALPRAZolam (XANAX) 0.5 MG tablet Take 0.5 mg by mouth daily as needed. (Patient not taking: Reported on 09/06/2020)     ketoconazole (NIZORAL) 2 % shampoo  (Patient not taking: Reported on 09/06/2020)     prochlorperazine (COMPAZINE) 10 MG tablet Take 1 tablet (10 mg total) by mouth every 6 (six) hours as needed for nausea or vomiting. (Patient not taking: Reported on 09/06/2020) 30 tablet 0   No current facility-administered medications for this visit.    SURGICAL HISTORY:  Past Surgical History:  Procedure Laterality Date   BACK SURGERY     BRONCHIAL BIOPSY  06/06/2020   Procedure: BRONCHIAL BIOPSIES;  Surgeon: Garner Nash, DO;  Location: [facility];  Service: Pulmonary;;   BRONCHIAL BRUSHINGS  06/06/2020   Procedure: BRONCHIAL BRUSHINGS;  Surgeon: Garner Nash, DO;  Location: [facility];  Service: Pulmonary;;   BRONCHIAL NEEDLE ASPIRATION BIOPSY  06/06/2020   Procedure: BRONCHIAL NEEDLE ASPIRATION BIOPSIES;  Surgeon: Garner Nash, DO;  Location: [facility];  Service: Pulmonary;;   BRONCHIAL WASHINGS  06/06/2020   Procedure: BRONCHIAL WASHINGS;  Surgeon: Garner Nash, DO;  Location: [facility];  Service: Pulmonary;;   COLONOSCOPY     OTHER SURGICAL HISTORY     fluid drawn off his testicle   PROSTATE BIOPSY     ROTATOR CUFF REPAIR Right    VIDEO BRONCHOSCOPY WITH ENDOBRONCHIAL NAVIGATION Right 06/06/2020   Procedure: VIDEO BRONCHOSCOPY WITH ENDOBRONCHIAL NAVIGATION;  Surgeon: Garner Nash, DO;  Location: [facility];  Service: Pulmonary;   Laterality: Right;   VIDEO BRONCHOSCOPY WITH ENDOBRONCHIAL ULTRASOUND Bilateral 06/06/2020   Procedure: VIDEO BRONCHOSCOPY WITH ENDOBRONCHIAL ULTRASOUND;  Surgeon: Garner Nash, DO;  Location: [facility];  Service: Pulmonary;  Laterality: Bilateral;   WRIST SURGERY      REVIEW OF SYSTEMS:   Review of Systems  Constitutional: Negative for appetite change, chills, fatigue, fever and unexpected weight change.  HENT:   Negative for mouth sores, nosebleeds, sore throat and trouble swallowing.   Eyes: Negative for eye problems and icterus.  Respiratory: Negative for cough, hemoptysis, shortness of breath and wheezing.   Cardiovascular: Negative for chest pain and leg swelling.  Gastrointestinal: Negative for abdominal pain, constipation, diarrhea, nausea and vomiting.  Genitourinary: Negative for bladder incontinence, difficulty urinating, dysuria, frequency and hematuria.   Musculoskeletal: Negative for back pain, gait problem, neck pain and neck stiffness.  Skin: Negative for itching and rash.  Neurological: Negative for dizziness, extremity weakness, gait problem, headaches, light-headedness and seizures.  Hematological: Negative for adenopathy. Does not bruise/bleed easily.  Psychiatric/Behavioral: Negative for confusion, depression and sleep disturbance. The patient is not nervous/anxious.     PHYSICAL EXAMINATION:  Blood pressure 137/83, pulse 76, temperature (!) 97.3 F (36.3 C), temperature source Tympanic, resp. rate 17, weight 198 lb 3.2 oz (89.9 kg), SpO2 99 %.  ECOG PERFORMANCE STATUS: 1  Physical Exam  Constitutional: Oriented to person, place, and time and well-developed, well-nourished, and in no distress.  HENT:  Head: Normocephalic and atraumatic.  Mouth/Throat: Oropharynx is clear and moist. No oropharyngeal exudate.  Eyes: Conjunctivae are normal. Right eye exhibits no discharge. Left eye exhibits no discharge. No scleral icterus.  Neck: Normal range of motion.  Neck supple.  Cardiovascular: Normal rate, regular rhythm, normal heart sounds and intact distal pulses.   Pulmonary/Chest: Effort normal and breath sounds normal. No respiratory distress. No wheezes. No rales.  Abdominal: Soft. Bowel sounds are normal. Exhibits no distension and no mass. There is no tenderness.  Musculoskeletal: Normal range of motion. Exhibits no edema.  Lymphadenopathy:    No cervical adenopathy.  Neurological: Alert and oriented to person, place, and time. Exhibits normal muscle tone. Gait normal. Coordination normal.  Skin: Skin is warm and dry. No rash noted. Not diaphoretic. No erythema. No pallor.  Psychiatric: Mood, memory and judgment normal.  Vitals reviewed.  LABORATORY DATA: Lab Results  Component Value Date   WBC 3.6 (L) 12/14/2020   HGB 10.4 (L) 12/14/2020   HCT 31.3 (L) 12/14/2020   MCV 96.0 12/14/2020   PLT 185 12/14/2020      Chemistry      Component Value Date/Time   NA 138 12/14/2020 0931   K 3.8 12/14/2020 0931   CL 109 12/14/2020 0931   CO2 19 (L) 12/14/2020 0931   BUN 20 12/14/2020 0931   CREATININE 1.54 (H) 12/14/2020 0931      Component Value Date/Time   CALCIUM 9.0 12/14/2020 0931   ALKPHOS 76 12/14/2020 0931   AST 25 12/14/2020 0931   ALT 24 12/14/2020  1696   BILITOT 0.3 12/14/2020 0931       RADIOGRAPHIC STUDIES:  CT Chest Wo Contrast  Result Date: 12/12/2020 CLINICAL DATA:  Metastatic non-small cell lung cancer. EXAM: CT CHEST WITHOUT CONTRAST TECHNIQUE: Multidetector CT imaging of the chest was performed following the standard protocol without IV contrast. COMPARISON:  09/04/2020 FINDINGS: Cardiovascular: Heart size is within normal limits. Aortic atherosclerosis. Coronary artery calcification. Mediastinum/Nodes: No enlarged mediastinal or axillary lymph nodes. Index right paratracheal lymph node measures 0.7 cm, image 57/2. Formally 0.9 cm. Index AP window lymph node measures 1.1 cm, image 55/2. Stable from previous exam.  The hilar lymph nodes are suboptimally evaluated due to lack of IV contrast material. Thyroid gland, trachea, and esophagus demonstrate no significant findings. Lungs/Pleura: Treated lesion within the right upper lobe measures 1.5 x 1.1 cm, image 24/7. Formally 1.9 x 1.5 cm (when remeasured.) Increased surrounding fibrosis compatible with changes due to external beam radiation. Moderate centrilobular and paraseptal emphysema. Chronic interstitial changes are again noted including lower lung zone predominant interstitial reticulation with ground-glass attenuation as well as diffuse bronchiectasis. No new pulmonary nodule or mass identified. Upper Abdomen: No acute abnormality. Previous too small to characterize posterior right hepatic lobe lesion is not confidently identified on today's study. Musculoskeletal: No chest wall mass or suspicious bone lesions identified. Status post ACDF. IMPRESSION: 1. Interval decrease in size of right upper lobe lung nodule with increased surrounding fibrosis compatible with changes due to external beam radiation. 2. No signs of nodal metastasis or metastatic disease within the chest. 3. Chronic interstitial lung disease. 4. Coronary artery calcifications. 5. Aortic Atherosclerosis (ICD10-I70.0) and Emphysema (ICD10-J43.9). Electronically Signed   By: Kerby Moors M.D.   On: 12/12/2020 10:01     ASSESSMENT/PLAN:  This is a very pleasant 70 year old African-American male recently diagnosed with stage IIIa/C (T1c, N2-N3, M0) non-small cell lung cancer, squamous cell carcinoma.  He presented with a right upper lobe lung nodule in addition to subcarinal lymphadenopathy and suspicious AP window lymph node involvement.  He was diagnosed in May 2022.  Also diagnosed with prostate adenocarcinoma with a Gleason score of 9.  He was diagnosed in April 2022.   The patient completed concurrent chemoradiation with carboplatin for an AUC of 2 and paclitaxel 45 mg per metered squared.  He is  status post 7 cycles and tolerated it well.    The patient is currently undergoing consolidation immunotherapy with Imfinzi 1500 mg IV every 4 weeks.  He is status post 3 cycles and tolerated it well.  The patient recently had a restaging CT scan performed. Dr. Julien Nordmann personally and independently reviewed the scan and discussed the results with the patient. The scan showed no evidence for disease progression.   Labs were reviewed.  He has baseline CKD. Ok to treat with creatinine of 1.54 today. Recommend that he proceed with cycle #4 today scheduled.    We will see him back for a follow up visit in 4 weeks for evaluation before starting cycle #5  The patient was advised to call immediately if he has any concerning symptoms in the interval. The patient voices understanding of current disease status and treatment options and is in agreement with the current care plan. All questions were answered. The patient knows to call the clinic with any problems, questions or concerns. We can certainly see the patient much sooner if necessary .    No orders of the defined types were placed in this encounter.    Wallie Lagrand L  Mandrell Vangilder, PA-C 12/14/20  ADDENDUM: Hematology/Oncology Attending: I had a face-to-face encounter with the patient today.  I reviewed his record, lab and scan and recommended his care plan.  This is a very pleasant 70 years old African-American male diagnosed with stage IIIa (T1c, N2, M0) non-small cell lung cancer, squamous cell carcinoma presented with right upper lobe lung nodule in addition to subcarinal and suspicious AP window lymphadenopathy diagnosed in May 2022.  The patient underwent a course of concurrent chemoradiation with weekly carboplatin and paclitaxel with partial response. He is currently undergoing consolidation treatment with immunotherapy 1500 Mg IV every 4 weeks status post 3 cycles.  The patient has been tolerating this treatment well with no concerning  adverse effects. He had repeat CT scan of the chest performed recently.  I personally and independently reviewed the scan and discussed the results with the patient today. His scan showed no concerning findings for disease progression and there was further decrease in the size of the primary tumor. I recommended for the patient to continue his current consolidation treatment with Imfinzi and he will proceed with cycle #4 today as planned. He will come back for follow-up visit in 4 weeks for evaluation before the next cycle of his treatment. The patient was advised to call immediately if he has any concerning symptoms in the interval.  The total time spent in the appointment was 30 minutes. Disclaimer: This note was dictated with voice recognition software. Similar sounding words can inadvertently be transcribed and may be missed upon review. Eilleen Kempf, MD 12/14/20

## 2020-12-11 ENCOUNTER — Ambulatory Visit (HOSPITAL_COMMUNITY)
Admission: RE | Admit: 2020-12-11 | Discharge: 2020-12-11 | Disposition: A | Discharge status: Home / Self Care | Payer: Medicare Other | Source: Ambulatory Visit | Attending: Physician Assistant | Admitting: Physician Assistant

## 2020-12-11 ENCOUNTER — Other Ambulatory Visit: Payer: Self-pay

## 2020-12-11 ENCOUNTER — Encounter (HOSPITAL_COMMUNITY): Payer: Self-pay

## 2020-12-11 DIAGNOSIS — C3491 Malignant neoplasm of unspecified part of right bronchus or lung: Secondary | ICD-10-CM | POA: Insufficient documentation

## 2020-12-12 NOTE — Progress Notes (Signed)
  Radiation Oncology         [phone]) [phone] ________________________________  Name: Jason [name]. MRN: [id number]  Date: 11/07/2020  DOB: [birth year]  End of Treatment Note  Diagnosis:   70 y.o. 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     Indication for treatment:  Curative, Definitive Radiotherapy       Radiation treatment dates:   09/12/20 - 11/07/20 (concurrent with ADT started 07/04/20)  Site/dose:  1. The prostate, seminal vesicles, and pelvic lymph nodes were initially treated to 45 Gy in 25 fractions of 1.8 Gy  2. The prostate only was boosted to 75 Gy with 15 additional fractions of 2.0 Gy   Beams/energy:  1. The prostate, seminal vesicles, and pelvic lymph nodes were initially treated using VMAT intensity modulated radiotherapy delivering 6 megavolt photons. Image guidance was performed with CB-CT studies prior to each fraction. He was immobilized with a body fix lower extremity mold.  2. the prostate only was boosted using VMAT intensity modulated radiotherapy delivering 6 megavolt photons. Image guidance was performed with CB-CT studies prior to each fraction. He was immobilized with a body fix lower extremity mold.  Narrative: The patient tolerated radiation treatment relatively well with only minor urinary irritation and modest fatigue.  He did report increased nocturia 5-6x/night and hot flashes but denied dysuria, gross hematuria, abdominal pain or bowel issues.  Plan: The patient has completed radiation treatment. He will return to radiation oncology clinic for routine followup in one month. I advised him to call or return sooner if he has any questions or concerns related to his recovery or treatment. ________________________________  Sheral Apley. Tammi Klippel, M.D.

## 2020-12-13 ENCOUNTER — Other Ambulatory Visit: Payer: Self-pay | Admitting: Urology

## 2020-12-13 ENCOUNTER — Telehealth: Payer: Self-pay

## 2020-12-13 ENCOUNTER — Encounter: Payer: Self-pay | Admitting: Urology

## 2020-12-13 ENCOUNTER — Encounter: Payer: Self-pay | Admitting: *Deleted

## 2020-12-13 ENCOUNTER — Ambulatory Visit
Admission: RE | Admit: 2020-12-13 | Discharge: 2020-12-13 | Disposition: A | Discharge status: Home / Self Care | Payer: Medicare Other | Source: Ambulatory Visit | Attending: Urology | Admitting: Urology

## 2020-12-13 DIAGNOSIS — C61 Malignant neoplasm of prostate: Secondary | ICD-10-CM

## 2020-12-13 DIAGNOSIS — C3491 Malignant neoplasm of unspecified part of right bronchus or lung: Secondary | ICD-10-CM

## 2020-12-13 NOTE — Telephone Encounter (Signed)
Left message for patient in reference to 10:00am-12/13/20 telephone appointment w/ regard to completing the nursing portion. I will attempt to call patient again at scheduled time of 10:00am.

## 2020-12-13 NOTE — Progress Notes (Addendum)
Radiation Oncology         [phone]) [phone] ________________________________  Name: Jason [name]. MRN: [id number]  Date: 12/13/2020  DOB: 1950/10/24  Post Treatment Note  CC: Lujean Amel, MD  Ardis Hughs, MD  Diagnosis:   69 y.o. 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    Interval Since Last Radiation:  5 weeks  09/12/20 - 11/07/20 (concurrent with ADT started 07/04/20): 1. The prostate, seminal vesicles, and pelvic lymph nodes were initially treated to 45 Gy in 25 fractions of 1.8 Gy  2. The prostate only was boosted to 75 Gy with 15 additional fractions of 2.0 Gy   6/6-7/21/22:  The primary tumor in the RUL lung and involved mediastinal adenopathy were treated to 66 Gy in 33 fractions of 2 Gy, concurrent with chemotherapy.  Narrative:  I spoke with the patient to conduct his routine scheduled 1 month follow up visit via telephone to spare the patient unnecessary potential exposure in the healthcare setting during the current COVID-19 pandemic.  The patient was notified in advance and gave permission to proceed with this visit format.  He tolerated radiation treatment relatively well with only minor urinary irritation and modest fatigue.  He did report increased nocturia 5-6x/night and hot flashes but denied dysuria, gross hematuria, abdominal pain or bowel issues.                              On review of systems, the patient states that he is doing very well in general.  He has continued to see gradual improvement in his LUTS and is now almost back to his baseline with nocturia only 1-2 times per night at this point.  He specifically denies dysuria, gross hematuria, straining to void, excessive daytime frequency, urgency, incomplete bladder emptying or incontinence.  He reports a healthy appetite  and is maintaining his weight.  He denies abdominal pain, nausea, vomiting, diarrhea or constipation.  He reports that his energy level is gradually improving and he feels that he continues to tolerate the ADT fairly well aside from periodic hot flashes.  Overall, he is quite pleased with his progress to date.  ALLERGIES:  has No Known Allergies.  Meds: Current Outpatient Medications  Medication Sig Dispense Refill   acetaminophen (TYLENOL) 325 MG tablet Take 650 mg by mouth every 6 (six) hours as needed.     ALPRAZolam (XANAX) 0.5 MG tablet Take 0.5 mg by mouth daily as needed. (Patient not taking: Reported on 09/06/2020)     atorvastatin (LIPITOR) 10 MG tablet Take 10 mg by mouth daily.     azelastine (ASTELIN) 0.1 % nasal spray Place 1 spray into both nostrils 2 (two) times daily as needed for rhinitis. Use in each nostril as directed     fluticasone (FLONASE) 50 MCG/ACT nasal spray Place 2 sprays into both nostrils daily.     ketoconazole (NIZORAL) 2 % shampoo  (Patient not taking: Reported on 09/06/2020)     levocetirizine (XYZAL) 5 MG tablet Take 5 mg by mouth every evening.     montelukast (SINGULAIR) 10 MG tablet Take 10 mg by mouth at bedtime.     prochlorperazine (COMPAZINE) 10 MG tablet Take 1 tablet (10 mg total) by mouth every 6 (six) hours  as needed for nausea or vomiting. (Patient not taking: Reported on 09/06/2020) 30 tablet 0   valsartan-hydrochlorothiazide (DIOVAN-HCT) 320-25 MG tablet Take 1 tablet by mouth daily.     No current facility-administered medications for this encounter.    Physical Findings:  vitals were not taken for this visit.  Pain Assessment Pain Score: 8  (Dorsalgia)/10 Unable to assess due to telephone follow-up visit format.  Lab Findings: Lab Results  Component Value Date   WBC 3.8 (L) 11/15/2020   HGB 11.0 (L) 11/15/2020   HCT 32.4 (L) 11/15/2020   MCV 95.9 11/15/2020   PLT 219 11/15/2020     Radiographic Findings: CT Chest Wo  Contrast  Result Date: 12/12/2020 CLINICAL DATA:  Metastatic non-small cell lung cancer. EXAM: CT CHEST WITHOUT CONTRAST TECHNIQUE: Multidetector CT imaging of the chest was performed following the standard protocol without IV contrast. COMPARISON:  09/04/2020 FINDINGS: Cardiovascular: Heart size is within normal limits. Aortic atherosclerosis. Coronary artery calcification. Mediastinum/Nodes: No enlarged mediastinal or axillary lymph nodes. Index right paratracheal lymph node measures 0.7 cm, image 57/2. Formally 0.9 cm. Index AP window lymph node measures 1.1 cm, image 55/2. Stable from previous exam. The hilar lymph nodes are suboptimally evaluated due to lack of IV contrast material. Thyroid gland, trachea, and esophagus demonstrate no significant findings. Lungs/Pleura: Treated lesion within the right upper lobe measures 1.5 x 1.1 cm, image 24/7. Formally 1.9 x 1.5 cm (when remeasured.) Increased surrounding fibrosis compatible with changes due to external beam radiation. Moderate centrilobular and paraseptal emphysema. Chronic interstitial changes are again noted including lower lung zone predominant interstitial reticulation with ground-glass attenuation as well as diffuse bronchiectasis. No new pulmonary nodule or mass identified. Upper Abdomen: No acute abnormality. Previous too small to characterize posterior right hepatic lobe lesion is not confidently identified on today's study. Musculoskeletal: No chest wall mass or suspicious bone lesions identified. Status post ACDF. IMPRESSION: 1. Interval decrease in size of right upper lobe lung nodule with increased surrounding fibrosis compatible with changes due to external beam radiation. 2. No signs of nodal metastasis or metastatic disease within the chest. 3. Chronic interstitial lung disease. 4. Coronary artery calcifications. 5. Aortic Atherosclerosis (ICD10-I70.0) and Emphysema (ICD10-J43.9). Electronically Signed   By: Kerby Moors M.D.   On:  12/12/2020 10:01    Impression/Plan: 71. 70 y.o. 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   Regarding the prostate cancer, he will continue to follow up with urology for ongoing PSA determinations but does not currently have any scheduled follow-up appointments with Dr. Louis Meckel to his knowledge. He understands what to expect with regards to PSA monitoring going forward.  It appears that he will be due for his next ADT injection in December 2022 so we will share this discussion with Dr. Louis Meckel to ensure that he stays on track regarding his treatment.  I will look forward to following his response to treatment via correspondence with urology, and would be happy to continue to participate in his care if clinically indicated. I talked to the patient about what to expect in the future, including his risk for erectile dysfunction and rectal bleeding.  He will also continue in routine follow-up under the care and direction of Dr. Earlie Server for continued management of his lung cancer.  He had a recent follow-up CT chest  which shows an excellent response to treatment with continued decrease in size of the treated right upper lobe lung lesion and resolution of the mediastinal lymphadenopathy.  He is currently on consolidative immunotherapy with Imfinzi and feels that he is tolerating this well so we will look forward to continuing to follow his progress.  I encouraged him to call or return to the office if he has any questions regarding his previous radiation or possible radiation side effects. He was comfortable with this plan and will follow up as needed.     Nicholos Johns, PA-C

## 2020-12-13 NOTE — Progress Notes (Addendum)
Patient reports dorsalgia 8/10. No other symptoms reported at this time.  I-PSS Score of 2 (mild).  Meaningful use complete.  No current urinary management medications. No urology follow-up scheduled at this time -per [facility].  Patient notified of 10:00am-12/13/20 telephone appointment and verbalized understanding.  Patient preferred contact- [phone]

## 2020-12-13 NOTE — Addendum Note (Signed)
Encounter addended by: Freeman Caldron, PA-C on: 12/13/2020 11:24 AM  Actions taken: Clinical Note Signed

## 2020-12-13 NOTE — Progress Notes (Signed)
[facility] CSW Psychosocial Distress Screening  Social Work was referred by distress screening protocol.  The patient scored a 10 on the Psychosocial Distress Thermometer which indicates severe distress. Social Work Theatre manager contacted patient by phone to assess for distress and other psychosocial needs.   Patient reports he is still feeling quite a bit of anxiety, but it has gone down some since his appointment yesterday. Intern discussed common feelings and emotions related to cancer, and the importance of support during treatment; Informed patient of the support team roles and support services at [facility]; Pt requested that info about virtual support group be mailed to him, which intern has done and included CSW contact information, encouraging patient to call with any questions or concerns.  ON[facility] DISTRESS SCREENING 12/13/2020  Screening Type   Distress experienced in past week (1-10) 10  Emotional problem type Nervousness/Anxiety;Adjusting to illness  Referral to support programs    Rosary Lively, Social Work Intern Supervised by Gwinda Maine, LCSW

## 2020-12-14 ENCOUNTER — Inpatient Hospital Stay: Payer: Medicare Other | Attending: Internal Medicine

## 2020-12-14 ENCOUNTER — Other Ambulatory Visit: Payer: Self-pay

## 2020-12-14 ENCOUNTER — Inpatient Hospital Stay (HOSPITAL_BASED_OUTPATIENT_CLINIC_OR_DEPARTMENT_OTHER): Payer: Medicare Other | Admitting: Physician Assistant

## 2020-12-14 ENCOUNTER — Inpatient Hospital Stay: Payer: Medicare Other

## 2020-12-14 VITALS — BP 137/83 | HR 76 | Temp 97.3°F | Resp 17 | Wt 198.2 lb

## 2020-12-14 DIAGNOSIS — Z5112 Encounter for antineoplastic immunotherapy: Secondary | ICD-10-CM | POA: Diagnosis present

## 2020-12-14 DIAGNOSIS — R590 Localized enlarged lymph nodes: Secondary | ICD-10-CM | POA: Insufficient documentation

## 2020-12-14 DIAGNOSIS — C3411 Malignant neoplasm of upper lobe, right bronchus or lung: Secondary | ICD-10-CM | POA: Insufficient documentation

## 2020-12-14 DIAGNOSIS — I700 Atherosclerosis of aorta: Secondary | ICD-10-CM | POA: Insufficient documentation

## 2020-12-14 DIAGNOSIS — Z923 Personal history of irradiation: Secondary | ICD-10-CM | POA: Diagnosis not present

## 2020-12-14 DIAGNOSIS — N183 Chronic kidney disease, stage 3 unspecified: Secondary | ICD-10-CM | POA: Diagnosis not present

## 2020-12-14 DIAGNOSIS — C61 Malignant neoplasm of prostate: Secondary | ICD-10-CM | POA: Insufficient documentation

## 2020-12-14 DIAGNOSIS — R5382 Chronic fatigue, unspecified: Secondary | ICD-10-CM

## 2020-12-14 DIAGNOSIS — Z79899 Other long term (current) drug therapy: Secondary | ICD-10-CM | POA: Insufficient documentation

## 2020-12-14 DIAGNOSIS — C3491 Malignant neoplasm of unspecified part of right bronchus or lung: Secondary | ICD-10-CM

## 2020-12-14 DIAGNOSIS — J432 Centrilobular emphysema: Secondary | ICD-10-CM | POA: Diagnosis not present

## 2020-12-14 DIAGNOSIS — J849 Interstitial pulmonary disease, unspecified: Secondary | ICD-10-CM | POA: Diagnosis not present

## 2020-12-14 DIAGNOSIS — J479 Bronchiectasis, uncomplicated: Secondary | ICD-10-CM | POA: Diagnosis not present

## 2020-12-14 DIAGNOSIS — I129 Hypertensive chronic kidney disease with stage 1 through stage 4 chronic kidney disease, or unspecified chronic kidney disease: Secondary | ICD-10-CM | POA: Insufficient documentation

## 2020-12-14 LAB — CMP (CANCER CENTER ONLY)
ALT: 24 U/L (ref 0–44)
AST: 25 U/L (ref 15–41)
Albumin: 3.5 g/dL (ref 3.5–5.0)
Alkaline Phosphatase: 76 U/L (ref 38–126)
Anion gap: 10 (ref 5–15)
BUN: 20 mg/dL (ref 8–23)
CO2: 19 mmol/L — ABNORMAL LOW (ref 22–32)
Calcium: 9 mg/dL (ref 8.9–10.3)
Chloride: 109 mmol/L (ref 98–111)
Creatinine: 1.54 mg/dL — ABNORMAL HIGH (ref 0.61–1.24)
GFR, Estimated: 48 mL/min — ABNORMAL LOW (ref >60)
Glucose, Bld: 104 mg/dL — ABNORMAL HIGH (ref 70–99)
Potassium: 3.8 mmol/L (ref 3.5–5.1)
Sodium: 138 mmol/L (ref 135–145)
Total Bilirubin: 0.3 mg/dL (ref 0.3–1.2)
Total Protein: 6.8 g/dL (ref 6.5–8.1)

## 2020-12-14 LAB — CBC WITH DIFFERENTIAL (CANCER CENTER ONLY)
Abs Immature Granulocytes: 0 10*3/uL (ref 0.00–0.07)
Basophils Absolute: 0 10*3/uL (ref 0.0–0.1)
Basophils Relative: 1 %
Eosinophils Absolute: 0.3 10*3/uL (ref 0.0–0.5)
Eosinophils Relative: 7 %
HCT: 31.3 % — ABNORMAL LOW (ref 39.0–52.0)
Hemoglobin: 10.4 g/dL — ABNORMAL LOW (ref 13.0–17.0)
Immature Granulocytes: 0 %
Lymphocytes Relative: 25 %
Lymphs Abs: 0.9 10*3/uL (ref 0.7–4.0)
MCH: 31.9 pg (ref 26.0–34.0)
MCHC: 33.2 g/dL (ref 30.0–36.0)
MCV: 96 fL (ref 80.0–100.0)
Monocytes Absolute: 0.6 10*3/uL (ref 0.1–1.0)
Monocytes Relative: 18 %
Neutro Abs: 1.8 10*3/uL (ref 1.7–7.7)
Neutrophils Relative %: 49 %
Platelet Count: 185 10*3/uL (ref 150–400)
RBC: 3.26 MIL/uL — ABNORMAL LOW (ref 4.22–5.81)
RDW: 13.5 % (ref 11.5–15.5)
WBC Count: 3.6 10*3/uL — ABNORMAL LOW (ref 4.0–10.5)
nRBC: 0 % (ref 0.0–0.2)

## 2020-12-14 LAB — TSH: TSH: 0.564 u[IU]/mL (ref 0.320–4.118)

## 2020-12-14 MED ORDER — SODIUM CHLORIDE 0.9 % IV SOLN: Freq: Once | INTRAVENOUS | Status: AC

## 2020-12-14 MED ORDER — SODIUM CHLORIDE 0.9 % IV SOLN
1500.0000 mg | Freq: Once | INTRAVENOUS | Status: AC
  Administered 2020-12-14: 12:00:00 1500 mg via INTRAVENOUS
  Filled 2020-12-14: qty 30

## 2020-12-14 NOTE — Progress Notes (Signed)
Ok to treat with Scr 1.54 per [employer], [employer]

## 2020-12-14 NOTE — Patient Instructions (Signed)
[facility]   Discharge Instructions: Thank you for choosing [facility] to provide your oncology and hematology care.   If you have a lab appointment with the Cancer Center, please go directly to the Cancer Center and check in at the registration area.   Wear comfortable clothing and clothing appropriate for easy access to any Portacath or PICC line.   We strive to give you quality time with your provider. You may need to reschedule your appointment if you arrive late (15 or more minutes).  Arriving late affects you and other patients whose appointments are after yours.  Also, if you miss three or more appointments without notifying the office, you may be dismissed from the clinic at the provider's discretion.      For prescription refill requests, have your pharmacy contact our office and allow 72 hours for refills to be completed.    Today you received the following chemotherapy and/or immunotherapy agents: durvalumab.      To help prevent nausea and vomiting after your treatment, we encourage you to take your nausea medication as directed.  BELOW ARE SYMPTOMS THAT SHOULD BE REPORTED IMMEDIATELY: *FEVER GREATER THAN 100.4 F (38 C) OR HIGHER *CHILLS OR SWEATING *NAUSEA AND VOMITING THAT IS NOT CONTROLLED WITH YOUR NAUSEA MEDICATION *UNUSUAL SHORTNESS OF BREATH *UNUSUAL BRUISING OR BLEEDING *URINARY PROBLEMS (pain or burning when urinating, or frequent urination) *BOWEL PROBLEMS (unusual diarrhea, constipation, pain near the anus) TENDERNESS IN MOUTH AND THROAT WITH OR WITHOUT PRESENCE OF ULCERS (sore throat, sores in mouth, or a toothache) UNUSUAL RASH, SWELLING OR PAIN  UNUSUAL VAGINAL DISCHARGE OR ITCHING   Items with * indicate a potential emergency and should be followed up as soon as possible or go to the Emergency Department if any problems should occur.  Please show the CHEMOTHERAPY ALERT CARD or IMMUNOTHERAPY ALERT CARD at check-in  to the Emergency Department and triage nurse.  Should you have questions after your visit or need to cancel or reschedule your appointment, please contact [facility]  Dept: [phone]  and follow the prompts.  Office hours are 8:00 a.m. to 4:30 p.m. Monday - Friday. Please note that voicemails left after 4:00 p.m. may not be returned until the following business day.  We are closed weekends and major holidays. You have access to a nurse at all times for urgent questions. Please call the main number to the clinic Dept: [phone] and follow the prompts.   For any non-urgent questions, you may also contact your provider using MyChart. We now offer e-Visits for anyone 18 and older to request care online for non-urgent symptoms. For details visit [email or web address].   Also download the MyChart app! Go to the app store, search "MyChart", open the app, select [facility], and log in with your MyChart username and password.  Due to Covid, a mask is required upon entering the hospital/clinic. If you do not have a mask, one will be given to you upon arrival. For doctor visits, patients may have 1 support person aged 18 or older with them. For treatment visits, patients cannot have anyone with them due to current Covid guidelines and our immunocompromised population.   

## 2020-12-27 ENCOUNTER — Telehealth: Payer: Self-pay

## 2020-12-27 NOTE — Telephone Encounter (Signed)
Pts wife called stating pt has a non-productive cough for 5-7 days. He denies CP, SOB and fever. She states it sounds like he had have a cold.

## 2021-01-11 ENCOUNTER — Inpatient Hospital Stay (HOSPITAL_BASED_OUTPATIENT_CLINIC_OR_DEPARTMENT_OTHER): Payer: Medicare Other | Admitting: Internal Medicine

## 2021-01-11 ENCOUNTER — Inpatient Hospital Stay: Payer: Medicare Other | Attending: Internal Medicine

## 2021-01-11 ENCOUNTER — Other Ambulatory Visit: Payer: Self-pay

## 2021-01-11 ENCOUNTER — Inpatient Hospital Stay: Payer: Medicare Other

## 2021-01-11 VITALS — BP 131/81 | HR 83 | Temp 97.5°F | Resp 19 | Ht 70.0 in | Wt 201.4 lb

## 2021-01-11 DIAGNOSIS — C3491 Malignant neoplasm of unspecified part of right bronchus or lung: Secondary | ICD-10-CM

## 2021-01-11 DIAGNOSIS — Z79899 Other long term (current) drug therapy: Secondary | ICD-10-CM | POA: Diagnosis not present

## 2021-01-11 DIAGNOSIS — Z5112 Encounter for antineoplastic immunotherapy: Secondary | ICD-10-CM | POA: Diagnosis not present

## 2021-01-11 DIAGNOSIS — C61 Malignant neoplasm of prostate: Secondary | ICD-10-CM | POA: Insufficient documentation

## 2021-01-11 DIAGNOSIS — I10 Essential (primary) hypertension: Secondary | ICD-10-CM | POA: Insufficient documentation

## 2021-01-11 DIAGNOSIS — C3411 Malignant neoplasm of upper lobe, right bronchus or lung: Secondary | ICD-10-CM | POA: Insufficient documentation

## 2021-01-11 DIAGNOSIS — Z923 Personal history of irradiation: Secondary | ICD-10-CM | POA: Diagnosis not present

## 2021-01-11 DIAGNOSIS — R5382 Chronic fatigue, unspecified: Secondary | ICD-10-CM

## 2021-01-11 LAB — CBC WITH DIFFERENTIAL (CANCER CENTER ONLY)
Abs Immature Granulocytes: 0.01 10*3/uL (ref 0.00–0.07)
Basophils Absolute: 0 10*3/uL (ref 0.0–0.1)
Basophils Relative: 1 %
Eosinophils Absolute: 0.5 10*3/uL (ref 0.0–0.5)
Eosinophils Relative: 13 %
HCT: 32.9 % — ABNORMAL LOW (ref 39.0–52.0)
Hemoglobin: 10.9 g/dL — ABNORMAL LOW (ref 13.0–17.0)
Immature Granulocytes: 0 %
Lymphocytes Relative: 29 %
Lymphs Abs: 1 10*3/uL (ref 0.7–4.0)
MCH: 31.1 pg (ref 26.0–34.0)
MCHC: 33.1 g/dL (ref 30.0–36.0)
MCV: 93.7 fL (ref 80.0–100.0)
Monocytes Absolute: 0.5 10*3/uL (ref 0.1–1.0)
Monocytes Relative: 13 %
Neutro Abs: 1.6 10*3/uL — ABNORMAL LOW (ref 1.7–7.7)
Neutrophils Relative %: 44 %
Platelet Count: 223 10*3/uL (ref 150–400)
RBC: 3.51 MIL/uL — ABNORMAL LOW (ref 4.22–5.81)
RDW: 13.4 % (ref 11.5–15.5)
WBC Count: 3.6 10*3/uL — ABNORMAL LOW (ref 4.0–10.5)
nRBC: 0 % (ref 0.0–0.2)

## 2021-01-11 LAB — CMP (CANCER CENTER ONLY)
ALT: 11 U/L (ref 0–44)
AST: 13 U/L — ABNORMAL LOW (ref 15–41)
Albumin: 3.6 g/dL (ref 3.5–5.0)
Alkaline Phosphatase: 75 U/L (ref 38–126)
Anion gap: 11 (ref 5–15)
BUN: 28 mg/dL — ABNORMAL HIGH (ref 8–23)
CO2: 18 mmol/L — ABNORMAL LOW (ref 22–32)
Calcium: 9.3 mg/dL (ref 8.9–10.3)
Chloride: 113 mmol/L — ABNORMAL HIGH (ref 98–111)
Creatinine: 1.81 mg/dL — ABNORMAL HIGH (ref 0.61–1.24)
GFR, Estimated: 39 mL/min — ABNORMAL LOW (ref >60)
Glucose, Bld: 119 mg/dL — ABNORMAL HIGH (ref 70–99)
Potassium: 3.8 mmol/L (ref 3.5–5.1)
Sodium: 142 mmol/L (ref 135–145)
Total Bilirubin: 0.3 mg/dL (ref 0.3–1.2)
Total Protein: 7.1 g/dL (ref 6.5–8.1)

## 2021-01-11 LAB — TSH: TSH: 0.54 u[IU]/mL (ref 0.320–4.118)

## 2021-01-11 MED ORDER — SODIUM CHLORIDE 0.9 % IV SOLN
1500.0000 mg | Freq: Once | INTRAVENOUS | Status: AC
  Administered 2021-01-11: 1500 mg via INTRAVENOUS
  Filled 2021-01-11: qty 30

## 2021-01-11 MED ORDER — SODIUM CHLORIDE 0.9 % IV SOLN: Freq: Once | INTRAVENOUS | Status: AC

## 2021-01-11 NOTE — Patient Instructions (Signed)
[facility]   Discharge Instructions: Thank you for choosing [facility] to provide your oncology and hematology care.   If you have a lab appointment with the Cancer Center, please go directly to the Cancer Center and check in at the registration area.   Wear comfortable clothing and clothing appropriate for easy access to any Portacath or PICC line.   We strive to give you quality time with your provider. You may need to reschedule your appointment if you arrive late (15 or more minutes).  Arriving late affects you and other patients whose appointments are after yours.  Also, if you miss three or more appointments without notifying the office, you may be dismissed from the clinic at the provider's discretion.      For prescription refill requests, have your pharmacy contact our office and allow 72 hours for refills to be completed.    Today you received the following chemotherapy and/or immunotherapy agents: durvalumab.      To help prevent nausea and vomiting after your treatment, we encourage you to take your nausea medication as directed.  BELOW ARE SYMPTOMS THAT SHOULD BE REPORTED IMMEDIATELY: *FEVER GREATER THAN 100.4 F (38 C) OR HIGHER *CHILLS OR SWEATING *NAUSEA AND VOMITING THAT IS NOT CONTROLLED WITH YOUR NAUSEA MEDICATION *UNUSUAL SHORTNESS OF BREATH *UNUSUAL BRUISING OR BLEEDING *URINARY PROBLEMS (pain or burning when urinating, or frequent urination) *BOWEL PROBLEMS (unusual diarrhea, constipation, pain near the anus) TENDERNESS IN MOUTH AND THROAT WITH OR WITHOUT PRESENCE OF ULCERS (sore throat, sores in mouth, or a toothache) UNUSUAL RASH, SWELLING OR PAIN  UNUSUAL VAGINAL DISCHARGE OR ITCHING   Items with * indicate a potential emergency and should be followed up as soon as possible or go to the Emergency Department if any problems should occur.  Please show the CHEMOTHERAPY ALERT CARD or IMMUNOTHERAPY ALERT CARD at check-in  to the Emergency Department and triage nurse.  Should you have questions after your visit or need to cancel or reschedule your appointment, please contact [facility]  Dept: [phone]  and follow the prompts.  Office hours are 8:00 a.m. to 4:30 p.m. Monday - Friday. Please note that voicemails left after 4:00 p.m. may not be returned until the following business day.  We are closed weekends and major holidays. You have access to a nurse at all times for urgent questions. Please call the main number to the clinic Dept: [phone] and follow the prompts.   For any non-urgent questions, you may also contact your provider using MyChart. We now offer e-Visits for anyone 18 and older to request care online for non-urgent symptoms. For details visit [email or web address].   Also download the MyChart app! Go to the app store, search "MyChart", open the app, select [facility], and log in with your MyChart username and password.  Due to Covid, a mask is required upon entering the hospital/clinic. If you do not have a mask, one will be given to you upon arrival. For doctor visits, patients may have 1 support person aged 18 or older with them. For treatment visits, patients cannot have anyone with them due to current Covid guidelines and our immunocompromised population.   

## 2021-01-11 NOTE — Progress Notes (Signed)
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   PRIOR THERAPY: Concurrent chemoradiation with weekly carboplatin for AUC of 2 and paclitaxel 45 Mg/M2. Status post 7 cycles. First dose on 07/04/20 and last cycle was giving August 14, 2020 with partial response.    CURRENT THERAPY: Consolidation treatment with immunotherapy with Imfinzi 1500 Mg IV every 4 weeks starting September 13, 2020.  Status post 4 cycles.  INTERVAL HISTORY: Jason [name]. 70 y.o. male returns to the clinic today for follow-up visit.  The patient is feeling fine today with no concerning complaints.  He denied having any current chest pain, shortness of breath, cough or hemoptysis.  He denied having any fever or chills.  He has no nausea, vomiting, diarrhea or constipation.  He has no headache or visual changes.  He denied having any weight loss or night sweats.  He continues to tolerate his treatment with Imfinzi fairly well.  The patient is here today for evaluation before starting cycle #5.  MEDICAL HISTORY: Past Medical History:  Diagnosis Date   Arthritis    back   Hypertension    Prostate cancer ([place])    Tobacco use     ALLERGIES:  has No Known Allergies.  MEDICATIONS:  Current Outpatient Medications  Medication Sig Dispense Refill   acetaminophen (TYLENOL) 325 MG tablet Take 650 mg by mouth every 6 (six) hours as needed.     ALPRAZolam (XANAX) 0.5 MG tablet Take 0.5 mg by mouth daily as needed. (Patient not taking: Reported on 09/06/2020)     atorvastatin (LIPITOR) 10 MG tablet  Take 10 mg by mouth daily.     azelastine (ASTELIN) 0.1 % nasal spray Place 1 spray into both nostrils 2 (two) times daily as needed for rhinitis. Use in each nostril as directed     fluticasone (FLONASE) 50 MCG/ACT nasal spray Place 2 sprays into both nostrils daily.     ketoconazole (NIZORAL) 2 % shampoo  (Patient not taking: Reported on 09/06/2020)     levocetirizine (XYZAL) 5 MG tablet Take 5 mg by mouth every evening.     montelukast (SINGULAIR) 10 MG tablet Take 10 mg by mouth at bedtime.     prochlorperazine (COMPAZINE) 10 MG tablet Take 1 tablet (10 mg total) by mouth every 6 (six) hours as needed for nausea or vomiting. (Patient not taking: Reported on 09/06/2020) 30 tablet 0   valsartan-hydrochlorothiazide (DIOVAN-HCT) 320-25 MG tablet Take 1 tablet by mouth daily.     No current facility-administered medications for this visit.    SURGICAL HISTORY:  Past Surgical History:  Procedure Laterality Date   BACK SURGERY     BRONCHIAL BIOPSY  06/06/2020   Procedure: BRONCHIAL BIOPSIES;  Surgeon: Garner Nash, DO;  Location: [facility];  Service: Pulmonary;;   BRONCHIAL BRUSHINGS  06/06/2020   Procedure: BRONCHIAL BRUSHINGS;  Surgeon: Garner Nash, DO;  Location: [facility];  Service: Pulmonary;;   BRONCHIAL NEEDLE ASPIRATION BIOPSY  06/06/2020   Procedure: BRONCHIAL NEEDLE ASPIRATION BIOPSIES;  Surgeon: Garner Nash, DO;  Location: [facility];  Service: Pulmonary;;  BRONCHIAL WASHINGS  06/06/2020   Procedure: BRONCHIAL WASHINGS;  Surgeon: Garner Nash, DO;  Location: [facility];  Service: Pulmonary;;   COLONOSCOPY     OTHER SURGICAL HISTORY     fluid drawn off his testicle   PROSTATE BIOPSY     ROTATOR CUFF REPAIR Right    VIDEO BRONCHOSCOPY WITH ENDOBRONCHIAL NAVIGATION Right 06/06/2020   Procedure: VIDEO BRONCHOSCOPY WITH ENDOBRONCHIAL NAVIGATION;  Surgeon: Garner Nash, DO;  Location: [facility];  Service: Pulmonary;  Laterality: Right;   VIDEO  BRONCHOSCOPY WITH ENDOBRONCHIAL ULTRASOUND Bilateral 06/06/2020   Procedure: VIDEO BRONCHOSCOPY WITH ENDOBRONCHIAL ULTRASOUND;  Surgeon: Garner Nash, DO;  Location: [facility];  Service: Pulmonary;  Laterality: Bilateral;   WRIST SURGERY      REVIEW OF SYSTEMS:  A comprehensive review of systems was negative.   PHYSICAL EXAMINATION: General appearance: alert, cooperative, and no distress Head: Normocephalic, without obvious abnormality, atraumatic Neck: no adenopathy, no JVD, supple, symmetrical, trachea midline, and thyroid not enlarged, symmetric, no tenderness/mass/nodules Lymph nodes: Cervical, supraclavicular, and axillary nodes normal. Resp: clear to auscultation bilaterally Back: symmetric, no curvature. ROM normal. No CVA tenderness. Cardio: regular rate and rhythm, S1, S2 normal, no murmur, click, rub or gallop GI: soft, non-tender; bowel sounds normal; no masses,  no organomegaly Extremities: extremities normal, atraumatic, no cyanosis or edema  ECOG PERFORMANCE STATUS: 1 - Symptomatic but completely ambulatory  Blood pressure 131/81, pulse 83, temperature (!) 97.5 F (36.4 C), temperature source Tympanic, resp. rate 19, height 5\' 10"  (1.778 m), weight 201 lb 6.4 oz (91.4 kg), SpO2 100 %.  LABORATORY DATA: Lab Results  Component Value Date   WBC 3.6 (L) 01/11/2021   HGB 10.9 (L) 01/11/2021   HCT 32.9 (L) 01/11/2021   MCV 93.7 01/11/2021   PLT 223 01/11/2021      Chemistry      Component Value Date/Time   NA 138 12/14/2020 0931   K 3.8 12/14/2020 0931   CL 109 12/14/2020 0931   CO2 19 (L) 12/14/2020 0931   BUN 20 12/14/2020 0931   CREATININE 1.54 (H) 12/14/2020 0931      Component Value Date/Time   CALCIUM 9.0 12/14/2020 0931   ALKPHOS 76 12/14/2020 0931   AST 25 12/14/2020 0931   ALT 24 12/14/2020 0931   BILITOT 0.3 12/14/2020 0931       RADIOGRAPHIC STUDIES: No results found.  ASSESSMENT AND PLAN: This is a very pleasant 70 years old  African-American male recently diagnosed with a stage IIIB (T1c, N3, M0) non-small cell lung cancer, squamous cell carcinoma presented with right upper lobe lung nodule in addition to subcarinal and suspicious AP window lymphadenopathy diagnosed in May 2022.  The patient also has a history of prostate adenocarcinoma diagnosed in April 2022. He underwent a course of concurrent chemoradiation with weekly carboplatin for AUC of 2 and paclitaxel 45 Mg/M2 status post 7 cycles.  The patient tolerated this course of concurrent chemoradiation fairly well with no concerning adverse effects. The patient is currently on consolidation treatment with immunotherapy with Imfinzi 1500 Mg IV every 4 weeks status post 4 cycles. The patient has been tolerating this treatment well with no concerning adverse effects. I recommended for him to proceed with cycle #5 today as planned. I will see him back for follow-up visit in 4 weeks for evaluation before starting cycle #6. The patient was advised to call immediately if he has any concerning symptoms in the interval. The patient voices understanding of current disease status  and treatment options and is in agreement with the current care plan.  All questions were answered. The patient knows to call the clinic with any problems, questions or concerns. We can certainly see the patient much sooner if necessary.  Disclaimer: This note was dictated with voice recognition software. Similar sounding words can inadvertently be transcribed and may not be corrected upon review.

## 2021-01-11 NOTE — Progress Notes (Signed)
Per Dr. Julien Nordmann, proceed treatment with elevated scr.

## 2021-02-01 ENCOUNTER — Encounter: Payer: Self-pay | Admitting: Internal Medicine

## 2021-02-06 NOTE — Progress Notes (Signed)
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  PRIOR THERAPY: Concurrent chemoradiation with weekly carboplatin for AUC of 2 and paclitaxel 45 Mg/M2. Status post 7 cycles. First dose on 07/04/20 and last cycle was giving August 14, 2020 with partial response.   CURRENT THERAPY: Consolidation treatment with immunotherapy with Imfinzi 1500 Mg IV every 4 weeks starting September 13, 2020. Status post 5 cycles.   INTERVAL HISTORY: Jason [name]. 71 y.o. male returns to the clinic for a follow up visit. The patient is feeling well today without any concerning complaints. He is tolerating his treatment well. Denies blood in the stool or abdominal pain. Denies recent diarrhea. Denies any fever, chills, or weight loss. He sometimes gets hot after he has his injection for his prostate cancer. Denies any chest pain, shortness of breath, cough, or hemoptysis. Denies any nausea, vomiting, diarrhea, or constipation. Denies any headache or visual changes. Denies any rashes or skin changes. The patient is here today for evaluation and to review his scan results  prior to starting cycle #6  MEDICAL HISTORY: Past Medical History:  Diagnosis Date   Arthritis    back   Hypertension    Prostate cancer ([place])    Tobacco use     ALLERGIES:  has No Known Allergies.  MEDICATIONS:  Current Outpatient Medications  Medication Sig Dispense Refill   acetaminophen (TYLENOL) 325 MG tablet Take 650 mg by mouth every 6 (six) hours as needed.     ALPRAZolam (XANAX) 0.5 MG tablet Take 0.5 mg by mouth daily as needed. (Patient not taking:  Reported on 09/06/2020)     atorvastatin (LIPITOR) 10 MG tablet Take 10 mg by mouth daily.     azelastine (ASTELIN) 0.1 % nasal spray Place 1 spray into both nostrils 2 (two) times daily as needed for rhinitis. Use in each nostril as directed     fluticasone (FLONASE) 50 MCG/ACT nasal spray Place 2 sprays into both nostrils daily.     ketoconazole (NIZORAL) 2 % shampoo  (Patient not taking: Reported on 09/06/2020)     levocetirizine (XYZAL) 5 MG tablet Take 5 mg by mouth every evening.     montelukast (SINGULAIR) 10 MG tablet Take 10 mg by mouth at bedtime.     prochlorperazine (COMPAZINE) 10 MG tablet Take 1 tablet (10 mg total) by mouth every 6 (six) hours as needed for nausea or vomiting. (Patient not taking: Reported on 09/06/2020) 30 tablet 0   valsartan-hydrochlorothiazide (DIOVAN-HCT) 320-25 MG tablet Take 1 tablet by mouth daily.     No current facility-administered medications for this visit.    SURGICAL HISTORY:  Past Surgical History:  Procedure Laterality Date   BACK SURGERY     BRONCHIAL BIOPSY  06/06/2020   Procedure: BRONCHIAL BIOPSIES;  Surgeon: Garner Nash, DO;  Location: [facility];  Service: Pulmonary;;   BRONCHIAL BRUSHINGS  06/06/2020   Procedure: BRONCHIAL BRUSHINGS;  Surgeon: Garner Nash, DO;  Location: [facility];  Service: Pulmonary;;   BRONCHIAL NEEDLE ASPIRATION BIOPSY  06/06/2020   Procedure: BRONCHIAL NEEDLE ASPIRATION BIOPSIES;  Surgeon: Garner Nash, DO;  Location: [facility];  Service: Pulmonary;;  BRONCHIAL WASHINGS  06/06/2020   Procedure: BRONCHIAL WASHINGS;  Surgeon: Garner Nash, DO;  Location: [facility];  Service: Pulmonary;;   COLONOSCOPY     OTHER SURGICAL HISTORY     fluid drawn off his testicle   PROSTATE BIOPSY     ROTATOR CUFF REPAIR Right    VIDEO BRONCHOSCOPY WITH ENDOBRONCHIAL NAVIGATION Right 06/06/2020   Procedure: VIDEO BRONCHOSCOPY WITH ENDOBRONCHIAL NAVIGATION;  Surgeon: Garner Nash, DO;  Location: [facility];  Service: Pulmonary;  Laterality: Right;   VIDEO BRONCHOSCOPY WITH ENDOBRONCHIAL ULTRASOUND Bilateral 06/06/2020   Procedure: VIDEO BRONCHOSCOPY WITH ENDOBRONCHIAL ULTRASOUND;  Surgeon: Garner Nash, DO;  Location: [facility];  Service: Pulmonary;  Laterality: Bilateral;   WRIST SURGERY      REVIEW OF SYSTEMS:   Constitutional: Negative for appetite change, chills, fatigue, fever and unexpected weight change.  HENT: Negative for mouth sores, nosebleeds, sore throat and trouble swallowing.   Eyes: Negative for eye problems and icterus.  Respiratory: Negative for cough, hemoptysis, shortness of breath and wheezing.   Cardiovascular: Negative for chest pain and leg swelling.  Gastrointestinal: Negative for abdominal pain, constipation, diarrhea, nausea and vomiting.  Genitourinary: Negative for bladder incontinence, difficulty urinating, dysuria, frequency and hematuria.   Musculoskeletal: Negative for back pain, gait problem, neck pain and neck stiffness.  Skin: Negative for itching and rash.  Neurological: Negative for dizziness, extremity weakness, gait problem, headaches, light-headedness and seizures.  Hematological: Negative for adenopathy. Does not bruise/bleed easily.  Psychiatric/Behavioral: Negative for confusion, depression and sleep disturbance. The patient is not nervous/anxious.     PHYSICAL EXAMINATION:  Blood pressure 129/86, pulse 78, temperature 98.1 F (36.7 C), temperature source Temporal, resp. rate 17, height 5\' 10"  (1.778 m), weight 202 lb 1.6 oz (91.7 kg), SpO2 99 %.  ECOG PERFORMANCE STATUS: 1  Physical Exam  Constitutional: Oriented to person, place, and time and well-developed, well-nourished, and in no distress.  HENT:  Head: Normocephalic and atraumatic.  Mouth/Throat: Oropharynx is clear and moist. No oropharyngeal exudate.  Eyes: Conjunctivae are normal. Right eye exhibits no discharge. Left eye exhibits no discharge. No scleral icterus.   Neck: Normal range of motion. Neck supple.  Cardiovascular: Normal rate, regular rhythm, normal heart sounds and intact distal pulses.   Pulmonary/Chest: Effort normal and breath sounds normal. No respiratory distress. No wheezes. No rales.  Abdominal: Soft. Bowel sounds are normal. Exhibits no distension and no mass. There is no tenderness.  Musculoskeletal: Normal range of motion. Exhibits no edema.  Lymphadenopathy:    No cervical adenopathy.  Neurological: Alert and oriented to person, place, and time. Exhibits normal muscle tone. Gait normal. Coordination normal.  Skin: Skin is warm and dry. No rash noted. Not diaphoretic. No erythema. No pallor.  Psychiatric: Mood, memory and judgment normal.  Vitals reviewed.  LABORATORY DATA: Lab Results  Component Value Date   WBC 3.7 (L) 02/08/2021   HGB 10.8 (L) 02/08/2021   HCT 32.6 (L) 02/08/2021   MCV 93.7 02/08/2021   PLT 232 02/08/2021      Chemistry      Component Value Date/Time   NA 139 02/08/2021 1039   K 3.8 02/08/2021 1039   CL 109 02/08/2021 1039   CO2 21 (L) 02/08/2021 1039   BUN 29 (H) 02/08/2021 1039   CREATININE 1.74 (H) 02/08/2021 1039      Component Value Date/Time   CALCIUM 9.5 02/08/2021 1039   ALKPHOS 73 02/08/2021 1039   AST 12 (L) 02/08/2021 1039  ALT 10 02/08/2021 1039   BILITOT 0.5 02/08/2021 1039       RADIOGRAPHIC STUDIES:  No results found.   ASSESSMENT/PLAN:  This is a very pleasant 71 year old African-American male recently diagnosed with stage IIIa/C (T1c, N2-N3, M0) non-small cell lung cancer, squamous cell carcinoma.  He presented with a right upper lobe lung nodule in addition to subcarinal lymphadenopathy and suspicious AP window lymph node involvement.  He was diagnosed in May 2022.  Also diagnosed with prostate adenocarcinoma with a Gleason score of 9.  He was diagnosed in April 2022.   The patient completed concurrent chemoradiation with carboplatin for an AUC of 2 and paclitaxel 45  mg per metered squared.  He is status post 7 cycles and tolerated it well.    The patient is currently undergoing consolidation immunotherapy with Imfinzi 1500 mg IV every 4 weeks.  He is status post 5 cycles and tolerated it well.  Labs were reviewed.  He has baseline CKD. Ok to treat with creatinine of 1.74 today. Recommend that he proceed with cycle #6 today scheduled.   I will arrange for a restaging CT scan of the chest prior to starting his next cycle of treatment. I will arrange without contrast due to his CKD.  We will see him back for a follow up visit in 4 weeks for evaluation before starting cycle #7  The patient was advised to call immediately if he has any concerning symptoms in the interval. The patient voices understanding of current disease status and treatment options and is in agreement with the current care plan. All questions were answered. The patient knows to call the clinic with any problems, questions or concerns. We can certainly see the patient much sooner if necessary        Orders Placed This Encounter  Procedures   CT Chest Wo Contrast    Standing Status:   Future    Standing Expiration Date:   02/08/2022    Order Specific Question:   Preferred imaging location?    Answer:   [facility]     The total time spent in the appointment was 20-29 minutes.  Nohely Whitehorn L Bernisha Verma, PA-C 02/08/21

## 2021-02-08 ENCOUNTER — Other Ambulatory Visit: Payer: Self-pay

## 2021-02-08 ENCOUNTER — Inpatient Hospital Stay: Payer: Medicare PPO

## 2021-02-08 ENCOUNTER — Inpatient Hospital Stay: Payer: Medicare PPO | Admitting: Physician Assistant

## 2021-02-08 ENCOUNTER — Inpatient Hospital Stay: Payer: Medicare PPO | Attending: Internal Medicine

## 2021-02-08 ENCOUNTER — Encounter: Payer: Self-pay | Admitting: Internal Medicine

## 2021-02-08 VITALS — BP 129/86 | HR 78 | Temp 98.1°F | Resp 17 | Ht 70.0 in | Wt 202.1 lb

## 2021-02-08 DIAGNOSIS — Z5112 Encounter for antineoplastic immunotherapy: Secondary | ICD-10-CM | POA: Insufficient documentation

## 2021-02-08 DIAGNOSIS — Z923 Personal history of irradiation: Secondary | ICD-10-CM | POA: Insufficient documentation

## 2021-02-08 DIAGNOSIS — N183 Chronic kidney disease, stage 3 unspecified: Secondary | ICD-10-CM | POA: Insufficient documentation

## 2021-02-08 DIAGNOSIS — Z79899 Other long term (current) drug therapy: Secondary | ICD-10-CM | POA: Insufficient documentation

## 2021-02-08 DIAGNOSIS — C61 Malignant neoplasm of prostate: Secondary | ICD-10-CM | POA: Insufficient documentation

## 2021-02-08 DIAGNOSIS — C3411 Malignant neoplasm of upper lobe, right bronchus or lung: Secondary | ICD-10-CM | POA: Diagnosis present

## 2021-02-08 DIAGNOSIS — I129 Hypertensive chronic kidney disease with stage 1 through stage 4 chronic kidney disease, or unspecified chronic kidney disease: Secondary | ICD-10-CM | POA: Diagnosis not present

## 2021-02-08 DIAGNOSIS — R590 Localized enlarged lymph nodes: Secondary | ICD-10-CM | POA: Insufficient documentation

## 2021-02-08 DIAGNOSIS — R5382 Chronic fatigue, unspecified: Secondary | ICD-10-CM

## 2021-02-08 DIAGNOSIS — C3491 Malignant neoplasm of unspecified part of right bronchus or lung: Secondary | ICD-10-CM

## 2021-02-08 LAB — CBC WITH DIFFERENTIAL (CANCER CENTER ONLY)
Abs Immature Granulocytes: 0.02 10*3/uL (ref 0.00–0.07)
Basophils Absolute: 0 10*3/uL (ref 0.0–0.1)
Basophils Relative: 1 %
Eosinophils Absolute: 0.3 10*3/uL (ref 0.0–0.5)
Eosinophils Relative: 8 %
HCT: 32.6 % — ABNORMAL LOW (ref 39.0–52.0)
Hemoglobin: 10.8 g/dL — ABNORMAL LOW (ref 13.0–17.0)
Immature Granulocytes: 1 %
Lymphocytes Relative: 26 %
Lymphs Abs: 1 10*3/uL (ref 0.7–4.0)
MCH: 31 pg (ref 26.0–34.0)
MCHC: 33.1 g/dL (ref 30.0–36.0)
MCV: 93.7 fL (ref 80.0–100.0)
Monocytes Absolute: 0.5 10*3/uL (ref 0.1–1.0)
Monocytes Relative: 14 %
Neutro Abs: 1.9 10*3/uL (ref 1.7–7.7)
Neutrophils Relative %: 50 %
Platelet Count: 232 10*3/uL (ref 150–400)
RBC: 3.48 MIL/uL — ABNORMAL LOW (ref 4.22–5.81)
RDW: 14 % (ref 11.5–15.5)
WBC Count: 3.7 10*3/uL — ABNORMAL LOW (ref 4.0–10.5)
nRBC: 0 % (ref 0.0–0.2)

## 2021-02-08 LAB — CMP (CANCER CENTER ONLY)
ALT: 10 U/L (ref 0–44)
AST: 12 U/L — ABNORMAL LOW (ref 15–41)
Albumin: 4 g/dL (ref 3.5–5.0)
Alkaline Phosphatase: 73 U/L (ref 38–126)
Anion gap: 9 (ref 5–15)
BUN: 29 mg/dL — ABNORMAL HIGH (ref 8–23)
CO2: 21 mmol/L — ABNORMAL LOW (ref 22–32)
Calcium: 9.5 mg/dL (ref 8.9–10.3)
Chloride: 109 mmol/L (ref 98–111)
Creatinine: 1.74 mg/dL — ABNORMAL HIGH (ref 0.61–1.24)
GFR, Estimated: 41 mL/min — ABNORMAL LOW (ref >60)
Glucose, Bld: 109 mg/dL — ABNORMAL HIGH (ref 70–99)
Potassium: 3.8 mmol/L (ref 3.5–5.1)
Sodium: 139 mmol/L (ref 135–145)
Total Bilirubin: 0.5 mg/dL (ref 0.3–1.2)
Total Protein: 7.1 g/dL (ref 6.5–8.1)

## 2021-02-08 LAB — TSH: TSH: 0.295 u[IU]/mL — ABNORMAL LOW (ref 0.320–4.118)

## 2021-02-08 MED ORDER — SODIUM CHLORIDE 0.9 % IV SOLN: Freq: Once | INTRAVENOUS | Status: AC

## 2021-02-08 MED ORDER — SODIUM CHLORIDE 0.9 % IV SOLN
1500.0000 mg | Freq: Once | INTRAVENOUS | Status: AC
  Administered 2021-02-08: 1500 mg via INTRAVENOUS
  Filled 2021-02-08: qty 30

## 2021-02-08 NOTE — Progress Notes (Signed)
Per Julien Nordmann MD, pt ok to treat with SCR of 1.74.

## 2021-02-26 ENCOUNTER — Other Ambulatory Visit: Payer: Self-pay | Admitting: Family Medicine

## 2021-02-26 ENCOUNTER — Ambulatory Visit
Admission: RE | Admit: 2021-02-26 | Discharge: 2021-02-26 | Disposition: A | Discharge status: Home / Self Care | Payer: Medicare PPO | Source: Ambulatory Visit | Attending: Family Medicine | Admitting: Family Medicine

## 2021-02-26 DIAGNOSIS — M545 Low back pain, unspecified: Secondary | ICD-10-CM

## 2021-02-26 DIAGNOSIS — M25551 Pain in right hip: Secondary | ICD-10-CM

## 2021-03-05 ENCOUNTER — Ambulatory Visit (HOSPITAL_COMMUNITY)
Admission: RE | Admit: 2021-03-05 | Discharge: 2021-03-05 | Disposition: A | Discharge status: Home / Self Care | Payer: Medicare PPO | Source: Ambulatory Visit | Attending: Physician Assistant | Admitting: Physician Assistant

## 2021-03-05 ENCOUNTER — Other Ambulatory Visit: Payer: Self-pay

## 2021-03-05 DIAGNOSIS — C3491 Malignant neoplasm of unspecified part of right bronchus or lung: Secondary | ICD-10-CM | POA: Diagnosis not present

## 2021-03-08 ENCOUNTER — Inpatient Hospital Stay: Payer: Medicare PPO

## 2021-03-08 ENCOUNTER — Inpatient Hospital Stay: Payer: Medicare PPO | Attending: Internal Medicine

## 2021-03-08 ENCOUNTER — Encounter: Payer: Self-pay | Admitting: Internal Medicine

## 2021-03-08 ENCOUNTER — Inpatient Hospital Stay: Payer: Medicare PPO | Admitting: Internal Medicine

## 2021-03-08 ENCOUNTER — Other Ambulatory Visit: Payer: Self-pay

## 2021-03-08 VITALS — Resp 18

## 2021-03-08 VITALS — BP 120/79 | HR 92 | Temp 97.0°F | Resp 96 | Ht 70.0 in | Wt 200.6 lb

## 2021-03-08 DIAGNOSIS — C3491 Malignant neoplasm of unspecified part of right bronchus or lung: Secondary | ICD-10-CM

## 2021-03-08 DIAGNOSIS — G8929 Other chronic pain: Secondary | ICD-10-CM | POA: Insufficient documentation

## 2021-03-08 DIAGNOSIS — C61 Malignant neoplasm of prostate: Secondary | ICD-10-CM | POA: Diagnosis not present

## 2021-03-08 DIAGNOSIS — Z923 Personal history of irradiation: Secondary | ICD-10-CM | POA: Diagnosis not present

## 2021-03-08 DIAGNOSIS — Z9221 Personal history of antineoplastic chemotherapy: Secondary | ICD-10-CM | POA: Insufficient documentation

## 2021-03-08 DIAGNOSIS — J432 Centrilobular emphysema: Secondary | ICD-10-CM | POA: Diagnosis not present

## 2021-03-08 DIAGNOSIS — C3411 Malignant neoplasm of upper lobe, right bronchus or lung: Secondary | ICD-10-CM | POA: Diagnosis present

## 2021-03-08 DIAGNOSIS — I2510 Atherosclerotic heart disease of native coronary artery without angina pectoris: Secondary | ICD-10-CM | POA: Insufficient documentation

## 2021-03-08 DIAGNOSIS — M47814 Spondylosis without myelopathy or radiculopathy, thoracic region: Secondary | ICD-10-CM | POA: Insufficient documentation

## 2021-03-08 DIAGNOSIS — Z5112 Encounter for antineoplastic immunotherapy: Secondary | ICD-10-CM | POA: Insufficient documentation

## 2021-03-08 DIAGNOSIS — M545 Low back pain, unspecified: Secondary | ICD-10-CM | POA: Diagnosis not present

## 2021-03-08 DIAGNOSIS — R590 Localized enlarged lymph nodes: Secondary | ICD-10-CM | POA: Insufficient documentation

## 2021-03-08 DIAGNOSIS — Z79899 Other long term (current) drug therapy: Secondary | ICD-10-CM | POA: Diagnosis not present

## 2021-03-08 DIAGNOSIS — M4316 Spondylolisthesis, lumbar region: Secondary | ICD-10-CM | POA: Diagnosis not present

## 2021-03-08 DIAGNOSIS — R5382 Chronic fatigue, unspecified: Secondary | ICD-10-CM

## 2021-03-08 DIAGNOSIS — I10 Essential (primary) hypertension: Secondary | ICD-10-CM | POA: Diagnosis not present

## 2021-03-08 LAB — CBC WITH DIFFERENTIAL (CANCER CENTER ONLY)
Abs Immature Granulocytes: 0.01 10*3/uL (ref 0.00–0.07)
Basophils Absolute: 0 10*3/uL (ref 0.0–0.1)
Basophils Relative: 1 %
Eosinophils Absolute: 0.2 10*3/uL (ref 0.0–0.5)
Eosinophils Relative: 5 %
HCT: 30.9 % — ABNORMAL LOW (ref 39.0–52.0)
Hemoglobin: 10.4 g/dL — ABNORMAL LOW (ref 13.0–17.0)
Immature Granulocytes: 0 %
Lymphocytes Relative: 24 %
Lymphs Abs: 1 10*3/uL (ref 0.7–4.0)
MCH: 31 pg (ref 26.0–34.0)
MCHC: 33.7 g/dL (ref 30.0–36.0)
MCV: 92.2 fL (ref 80.0–100.0)
Monocytes Absolute: 0.5 10*3/uL (ref 0.1–1.0)
Monocytes Relative: 12 %
Neutro Abs: 2.4 10*3/uL (ref 1.7–7.7)
Neutrophils Relative %: 58 %
Platelet Count: 222 10*3/uL (ref 150–400)
RBC: 3.35 MIL/uL — ABNORMAL LOW (ref 4.22–5.81)
RDW: 14.6 % (ref 11.5–15.5)
WBC Count: 4 10*3/uL (ref 4.0–10.5)
nRBC: 0 % (ref 0.0–0.2)

## 2021-03-08 LAB — CMP (CANCER CENTER ONLY)
ALT: 9 U/L (ref 0–44)
AST: 12 U/L — ABNORMAL LOW (ref 15–41)
Albumin: 4 g/dL (ref 3.5–5.0)
Alkaline Phosphatase: 76 U/L (ref 38–126)
Anion gap: 8 (ref 5–15)
BUN: 40 mg/dL — ABNORMAL HIGH (ref 8–23)
CO2: 20 mmol/L — ABNORMAL LOW (ref 22–32)
Calcium: 9.5 mg/dL (ref 8.9–10.3)
Chloride: 112 mmol/L — ABNORMAL HIGH (ref 98–111)
Creatinine: 1.96 mg/dL — ABNORMAL HIGH (ref 0.61–1.24)
GFR, Estimated: 36 mL/min — ABNORMAL LOW (ref >60)
Glucose, Bld: 84 mg/dL (ref 70–99)
Potassium: 3.8 mmol/L (ref 3.5–5.1)
Sodium: 140 mmol/L (ref 135–145)
Total Bilirubin: 0.5 mg/dL (ref 0.3–1.2)
Total Protein: 7 g/dL (ref 6.5–8.1)

## 2021-03-08 LAB — TSH: TSH: 0.29 u[IU]/mL — ABNORMAL LOW (ref 0.320–4.118)

## 2021-03-08 MED ORDER — SODIUM CHLORIDE 0.9 % IV SOLN: Freq: Once | INTRAVENOUS | Status: AC

## 2021-03-08 MED ORDER — SODIUM CHLORIDE 0.9 % IV SOLN
1500.0000 mg | Freq: Once | INTRAVENOUS | Status: AC
  Administered 2021-03-08: 1500 mg via INTRAVENOUS
  Filled 2021-03-08: qty 30

## 2021-03-08 NOTE — Patient Instructions (Signed)
[facility]   Discharge Instructions: Thank you for choosing [facility] to provide your oncology and hematology care.   If you have a lab appointment with the Cancer Center, please go directly to the Cancer Center and check in at the registration area.   Wear comfortable clothing and clothing appropriate for easy access to any Portacath or PICC line.   We strive to give you quality time with your provider. You may need to reschedule your appointment if you arrive late (15 or more minutes).  Arriving late affects you and other patients whose appointments are after yours.  Also, if you miss three or more appointments without notifying the office, you may be dismissed from the clinic at the provider's discretion.      For prescription refill requests, have your pharmacy contact our office and allow 72 hours for refills to be completed.    Today you received the following chemotherapy and/or immunotherapy agents: durvalumab.      To help prevent nausea and vomiting after your treatment, we encourage you to take your nausea medication as directed.  BELOW ARE SYMPTOMS THAT SHOULD BE REPORTED IMMEDIATELY: *FEVER GREATER THAN 100.4 F (38 C) OR HIGHER *CHILLS OR SWEATING *NAUSEA AND VOMITING THAT IS NOT CONTROLLED WITH YOUR NAUSEA MEDICATION *UNUSUAL SHORTNESS OF BREATH *UNUSUAL BRUISING OR BLEEDING *URINARY PROBLEMS (pain or burning when urinating, or frequent urination) *BOWEL PROBLEMS (unusual diarrhea, constipation, pain near the anus) TENDERNESS IN MOUTH AND THROAT WITH OR WITHOUT PRESENCE OF ULCERS (sore throat, sores in mouth, or a toothache) UNUSUAL RASH, SWELLING OR PAIN  UNUSUAL VAGINAL DISCHARGE OR ITCHING   Items with * indicate a potential emergency and should be followed up as soon as possible or go to the Emergency Department if any problems should occur.  Please show the CHEMOTHERAPY ALERT CARD or IMMUNOTHERAPY ALERT CARD at check-in  to the Emergency Department and triage nurse.  Should you have questions after your visit or need to cancel or reschedule your appointment, please contact [facility]  Dept: [phone]  and follow the prompts.  Office hours are 8:00 a.m. to 4:30 p.m. Monday - Friday. Please note that voicemails left after 4:00 p.m. may not be returned until the following business day.  We are closed weekends and major holidays. You have access to a nurse at all times for urgent questions. Please call the main number to the clinic Dept: [phone] and follow the prompts.   For any non-urgent questions, you may also contact your provider using MyChart. We now offer e-Visits for anyone 18 and older to request care online for non-urgent symptoms. For details visit [email or web address].   Also download the MyChart app! Go to the app store, search "MyChart", open the app, select [facility], and log in with your MyChart username and password.  Due to Covid, a mask is required upon entering the hospital/clinic. If you do not have a mask, one will be given to you upon arrival. For doctor visits, patients may have 1 support person aged 18 or older with them. For treatment visits, patients cannot have anyone with them due to current Covid guidelines and our immunocompromised population.   

## 2021-03-08 NOTE — Progress Notes (Signed)
Per Dr. Julien Nordmann, pt ok to tx with SCR 1.96

## 2021-03-08 NOTE — Progress Notes (Signed)
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   PRIOR THERAPY: Concurrent chemoradiation with weekly carboplatin for AUC of 2 and paclitaxel 45 Mg/M2. Status post 7 cycles. First dose on 07/04/20 and last cycle was giving August 14, 2020 with partial response.    CURRENT THERAPY: Consolidation treatment with immunotherapy with Imfinzi 1500 Mg IV every 4 weeks starting September 13, 2020.  Status post 6 cycles.  INTERVAL HISTORY: Jason [name]. 71 y.o. male returns to the clinic today for follow-up visit.  The patient is feeling fine today with no concerning complaints.  He denied having any current chest pain, shortness of breath, cough or hemoptysis.  He denied having any nausea, vomiting, diarrhea or constipation.  He has no headache or visual changes.  The patient has no significant weight loss or night sweats.  He continues to tolerate his treatment with immunotherapy with Imfinzi fairly well.  He is here today for evaluation and repeat CT scan of the chest for restaging of his disease.   MEDICAL HISTORY: Past Medical History:  Diagnosis Date   Arthritis    back   Hypertension    Prostate cancer ([place])    Tobacco use     ALLERGIES:  has No Known Allergies.  MEDICATIONS:  Current Outpatient Medications  Medication Sig Dispense Refill   acetaminophen (TYLENOL) 325 MG tablet Take 650 mg by mouth every 6 (six) hours as needed.     ALPRAZolam (XANAX) 0.5 MG tablet Take 0.5 mg by mouth daily as needed. (Patient not taking: Reported on 09/06/2020)      atorvastatin (LIPITOR) 10 MG tablet Take 10 mg by mouth daily.     azelastine (ASTELIN) 0.1 % nasal spray Place 1 spray into both nostrils 2 (two) times daily as needed for rhinitis. Use in each nostril as directed     fluticasone (FLONASE) 50 MCG/ACT nasal spray Place 2 sprays into both nostrils daily.     ketoconazole (NIZORAL) 2 % shampoo  (Patient not taking: Reported on 09/06/2020)     levocetirizine (XYZAL) 5 MG tablet Take 5 mg by mouth every evening.     montelukast (SINGULAIR) 10 MG tablet Take 10 mg by mouth at bedtime.     prochlorperazine (COMPAZINE) 10 MG tablet Take 1 tablet (10 mg total) by mouth every 6 (six) hours as needed for nausea or vomiting. (Patient not taking: Reported on 09/06/2020) 30 tablet 0   valsartan-hydrochlorothiazide (DIOVAN-HCT) 320-25 MG tablet Take 1 tablet by mouth daily.     No current facility-administered medications for this visit.    SURGICAL HISTORY:  Past Surgical History:  Procedure Laterality Date   BACK SURGERY     BRONCHIAL BIOPSY  06/06/2020   Procedure: BRONCHIAL BIOPSIES;  Surgeon: Garner Nash, DO;  Location: [facility];  Service: Pulmonary;;   BRONCHIAL BRUSHINGS  06/06/2020   Procedure: BRONCHIAL BRUSHINGS;  Surgeon: Garner Nash, DO;  Location: [facility];  Service: Pulmonary;;   BRONCHIAL NEEDLE ASPIRATION BIOPSY  06/06/2020   Procedure: BRONCHIAL NEEDLE ASPIRATION BIOPSIES;  Surgeon: Garner Nash, DO;  Location: [facility];  Service: Pulmonary;;   BRONCHIAL WASHINGS  06/06/2020   Procedure: BRONCHIAL WASHINGS;  Surgeon: Garner Nash, DO;  Location: [facility];  Service: Pulmonary;;   COLONOSCOPY     OTHER SURGICAL HISTORY     fluid drawn off his testicle   PROSTATE BIOPSY     ROTATOR CUFF REPAIR Right    VIDEO BRONCHOSCOPY WITH ENDOBRONCHIAL NAVIGATION Right 06/06/2020   Procedure: VIDEO BRONCHOSCOPY WITH ENDOBRONCHIAL NAVIGATION;  Surgeon: Garner Nash, DO;  Location: [facility];  Service: Pulmonary;   Laterality: Right;   VIDEO BRONCHOSCOPY WITH ENDOBRONCHIAL ULTRASOUND Bilateral 06/06/2020   Procedure: VIDEO BRONCHOSCOPY WITH ENDOBRONCHIAL ULTRASOUND;  Surgeon: Garner Nash, DO;  Location: [facility];  Service: Pulmonary;  Laterality: Bilateral;   WRIST SURGERY      REVIEW OF SYSTEMS:  Constitutional: negative Eyes: negative Ears, nose, mouth, throat, and face: negative Respiratory: negative Cardiovascular: negative Gastrointestinal: negative Genitourinary:negative Integument/breast: negative Hematologic/lymphatic: negative Musculoskeletal:negative Neurological: negative Behavioral/Psych: negative Endocrine: negative Allergic/Immunologic: negative   PHYSICAL EXAMINATION: General appearance: alert, cooperative, and no distress Head: Normocephalic, without obvious abnormality, atraumatic Neck: no adenopathy, no JVD, supple, symmetrical, trachea midline, and thyroid not enlarged, symmetric, no tenderness/mass/nodules Lymph nodes: Cervical, supraclavicular, and axillary nodes normal. Resp: clear to auscultation bilaterally Back: symmetric, no curvature. ROM normal. No CVA tenderness. Cardio: regular rate and rhythm, S1, S2 normal, no murmur, click, rub or gallop GI: soft, non-tender; bowel sounds normal; no masses,  no organomegaly Extremities: extremities normal, atraumatic, no cyanosis or edema Neurologic: Alert and oriented X 3, normal strength and tone. Normal symmetric reflexes. Normal coordination and gait  ECOG PERFORMANCE STATUS: 1 - Symptomatic but completely ambulatory  Blood pressure 120/79, pulse 92, temperature (!) 97 F (36.1 C), temperature source Tympanic, resp. rate (!) 96, height 5\' 10"  (1.778 m), weight 200 lb 9.6 oz (91 kg), SpO2 96 %.  LABORATORY DATA: Lab Results  Component Value Date   WBC 4.0 03/08/2021   HGB 10.4 (L) 03/08/2021   HCT 30.9 (L) 03/08/2021   MCV 92.2 03/08/2021   PLT 222 03/08/2021      Chemistry      Component Value  Date/Time   NA 139 02/08/2021 1039   K 3.8 02/08/2021 1039   CL 109 02/08/2021 1039   CO2 21 (L) 02/08/2021 1039   BUN 29 (H) 02/08/2021 1039   CREATININE 1.74 (H) 02/08/2021 1039      Component Value Date/Time   CALCIUM 9.5 02/08/2021 1039   ALKPHOS 73 02/08/2021 1039   AST 12 (L) 02/08/2021 1039   ALT 10 02/08/2021 1039   BILITOT 0.5 02/08/2021 1039       RADIOGRAPHIC STUDIES: DG Lumbar Spine 2-3 Views  Result Date: 02/27/2021 CLINICAL DATA:  Low back pain, no known injury, initial encounter EXAM: LUMBAR SPINE - 3 VIEW COMPARISON:  None. FINDINGS: Five lumbar type vertebral bodies are well visualized. Vertebral body height is well maintained. Degenerative anterolisthesis of L4 on L5 is noted. Mild osteophytic changes are seen. No soft tissue changes are noted. IMPRESSION: Degenerative change with mild anterolisthesis as described. Electronically Signed   By: Inez Catalina M.D.   On: 02/27/2021 00:39   CT Chest Wo Contrast  Result Date: 03/06/2021 CLINICAL DATA:  Non-small cell lung cancer. Assess treatment response. EXAM: CT CHEST WITHOUT CONTRAST TECHNIQUE: Multidetector CT imaging of the chest was performed following the standard protocol without IV contrast. RADIATION DOSE REDUCTION: This exam was performed according to the departmental dose-optimization program which includes automated exposure  control, adjustment of the mA and/or kV according to patient size and/or use of iterative reconstruction technique. COMPARISON:  CT 12/11/2020 and 09/04/2020 FINDINGS: Cardiovascular: Stable minimal aortic and coronary artery atherosclerosis. No acute vascular findings. The heart size is normal. There is no pericardial effusion. Mediastinum/Nodes: There are no enlarged mediastinal, hilar or axillary lymph nodes.Small mediastinal lymph nodes are stable. Hilar assessment is limited by the lack of intravenous contrast, although the hilar contours appear unchanged. The thyroid gland, trachea and  esophagus demonstrate no significant findings. Lungs/Pleura: There is no pleural effusion. Stable chronic lung disease with moderate centrilobular and paraseptal emphysema and scattered subpleural reticulation. The treated right apical lesion is partly obscured by adjacent radiation changes, measuring approximately 1.4 x 1.0 cm on image 17/5, similar to the prior study. No new or enlarging pulmonary nodules are identified. Upper abdomen: The visualized upper abdomen appears stable without significant findings. Musculoskeletal/Chest wall: There is no chest wall mass or suspicious osseous finding. Multilevel thoracic spondylosis. Previous lower cervical fusion. IMPRESSION: 1. Similar size of treated right apical lesion with increased surrounding opacities attributed to external beam radiation. 2. No evidence of local progression of disease or metastatic disease. 3. Stable chronic lung disease with Emphysema (ICD10-J43.9). and scattered subpleural reticulation. 4. No acute findings. Electronically Signed   By: Richardean Sale M.D.   On: 03/06/2021 15:51   DG HIP UNILAT WITH PELVIS 2-3 VIEWS RIGHT  Result Date: 02/27/2021 CLINICAL DATA:  Chronic right hip pain, no known injury, initial encounter EXAM: DG HIP (WITH OR WITHOUT PELVIS) 2V RIGHT COMPARISON:  None. FINDINGS: Degenerative changes of the pubic symphysis are noted. No acute fracture or dislocation is seen. No soft tissue abnormality is noted. IMPRESSION: No acute abnormality seen. Electronically Signed   By: Inez Catalina M.D.   On: 02/27/2021 00:39    ASSESSMENT AND PLAN: This is a very pleasant 71 years old African-American male recently diagnosed with a stage IIIB (T1c, N3, M0) non-small cell lung cancer, squamous cell carcinoma presented with right upper lobe lung nodule in addition to subcarinal and suspicious AP window lymphadenopathy diagnosed in May 2022.  The patient also has a history of prostate adenocarcinoma diagnosed in April 2022. He  underwent a course of concurrent chemoradiation with weekly carboplatin for AUC of 2 and paclitaxel 45 Mg/M2 status post 7 cycles.  The patient tolerated this course of concurrent chemoradiation fairly well with no concerning adverse effects. The patient is currently on consolidation treatment with immunotherapy with Imfinzi 1500 Mg IV every 4 weeks status post 6 cycles. The patient continues to tolerate this treatment well with no concerning adverse effects. He had repeat CT scan of the chest performed recently.  I personally and independently reviewed the scan and discussed the results with the patient today. His scan showed no concerning findings for disease progression. I recommended for him to continue his current treatment with Imfinzi and he will proceed with cycle #7 today. I will see the patient back for follow-up visit in 4 weeks for evaluation before the next cycle of his treatment. The patient was advised to call immediately if he has any other concerning symptoms in the interval. The patient voices understanding of current disease status and treatment options and is in agreement with the current care plan.  All questions were answered. The patient knows to call the clinic with any problems, questions or concerns. We can certainly see the patient much sooner if necessary.  Disclaimer: This note was dictated with voice recognition software.  Similar sounding words can inadvertently be transcribed and may not be corrected upon review.

## 2021-04-02 NOTE — Progress Notes (Signed)
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  PRIOR THERAPY: Concurrent chemoradiation with weekly carboplatin for AUC of 2 and paclitaxel 45 Mg/M2. Status post 7 cycles. First dose on 07/04/20 and last cycle was giving August 14, 2020 with partial response.   CURRENT THERAPY:  Consolidation treatment with immunotherapy with Imfinzi 1500 Mg IV every 4 weeks starting September 13, 2020. Status post 7 cycles.   INTERVAL HISTORY: Jason [name]. 71 y.o. male returns to the clinic for a follow up visit. The patient is feeling well today without any concerning complaints. He is tolerating his treatment well. Denies blood in the stool or abdominal pain. Denies recent diarrhea. Denies any fever, chills, or weight loss. He sometimes gets hot flashes after he has his injection for his prostate cancer. Denies any chest pain, shortness of breath, cough, or hemoptysis. Denies any nausea, vomiting, diarrhea, or constipation. Denies any headache or visual changes. Denies any rashes or skin changes. The patient is here today for evaluation prior to starting cycle #8   MEDICAL HISTORY: Past Medical History:  Diagnosis Date   Arthritis    back   Hypertension    Prostate cancer ([place])    Tobacco use     ALLERGIES:  has No Known Allergies.  MEDICATIONS:  Current Outpatient Medications  Medication Sig Dispense Refill   acetaminophen (TYLENOL) 325 MG tablet Take 650 mg by mouth every 6 (six) hours as needed.     atorvastatin (LIPITOR) 10 MG tablet Take 10 mg by mouth daily.     azelastine (ASTELIN) 0.1 % nasal spray Place 1 spray  into both nostrils 2 (two) times daily as needed for rhinitis. Use in each nostril as directed     fluticasone (FLONASE) 50 MCG/ACT nasal spray Place 2 sprays into both nostrils daily.     levocetirizine (XYZAL) 5 MG tablet Take 5 mg by mouth every evening.     montelukast (SINGULAIR) 10 MG tablet Take 10 mg by mouth at bedtime.     valsartan-hydrochlorothiazide (DIOVAN-HCT) 320-25 MG tablet Take 1 tablet by mouth daily.     venlafaxine (EFFEXOR) 25 MG tablet 1 tablet with food     No current facility-administered medications for this visit.    SURGICAL HISTORY:  Past Surgical History:  Procedure Laterality Date   BACK SURGERY     BRONCHIAL BIOPSY  06/06/2020   Procedure: BRONCHIAL BIOPSIES;  Surgeon: Garner Nash, DO;  Location: [facility];  Service: Pulmonary;;   BRONCHIAL BRUSHINGS  06/06/2020   Procedure: BRONCHIAL BRUSHINGS;  Surgeon: Garner Nash, DO;  Location: [facility];  Service: Pulmonary;;   BRONCHIAL NEEDLE ASPIRATION BIOPSY  06/06/2020   Procedure: BRONCHIAL NEEDLE ASPIRATION BIOPSIES;  Surgeon: Garner Nash, DO;  Location: [facility];  Service: Pulmonary;;   BRONCHIAL WASHINGS  06/06/2020   Procedure: BRONCHIAL WASHINGS;  Surgeon: Garner Nash, DO;  Location: [facility];  Service: Pulmonary;;   COLONOSCOPY     OTHER SURGICAL HISTORY     fluid drawn off his testicle   PROSTATE BIOPSY     ROTATOR CUFF REPAIR Right    VIDEO BRONCHOSCOPY WITH ENDOBRONCHIAL NAVIGATION Right  06/06/2020   Procedure: VIDEO BRONCHOSCOPY WITH ENDOBRONCHIAL NAVIGATION;  Surgeon: Garner Nash, DO;  Location: [facility];  Service: Pulmonary;  Laterality: Right;   VIDEO BRONCHOSCOPY WITH ENDOBRONCHIAL ULTRASOUND Bilateral 06/06/2020   Procedure: VIDEO BRONCHOSCOPY WITH ENDOBRONCHIAL ULTRASOUND;  Surgeon: Garner Nash, DO;  Location: [facility];  Service: Pulmonary;  Laterality: Bilateral;   WRIST SURGERY      REVIEW OF SYSTEMS:   Review of Systems  Constitutional:  Negative for appetite change, chills, fatigue, fever and unexpected weight change.  HENT:   Negative for mouth sores, nosebleeds, sore throat and trouble swallowing.   Eyes: Negative for eye problems and icterus.  Respiratory: Negative for cough, hemoptysis, shortness of breath and wheezing.   Cardiovascular: Negative for chest pain and leg swelling.  Gastrointestinal: Negative for abdominal pain, constipation, diarrhea, nausea and vomiting.  Genitourinary: Negative for bladder incontinence, difficulty urinating, dysuria, frequency and hematuria.   Musculoskeletal: Negative for back pain, gait problem, neck pain and neck stiffness.  Skin: Negative for itching and rash.  Neurological: Negative for dizziness, extremity weakness, gait problem, headaches, light-headedness and seizures.  Hematological: Negative for adenopathy. Does not bruise/bleed easily.  Psychiatric/Behavioral: Negative for confusion, depression and sleep disturbance. The patient is not nervous/anxious.     PHYSICAL EXAMINATION:  Blood pressure (!) 143/85, pulse 93, temperature 97.8 F (36.6 C), temperature source Tympanic, resp. rate 18, height 5\' 10"  (1.778 m), weight 196 lb 9.6 oz (89.2 kg), SpO2 97 %.  ECOG PERFORMANCE STATUS: 1  Physical Exam  Constitutional: Oriented to person, place, and time and well-developed, well-nourished, and in no distress.  HENT:  Head: Normocephalic and atraumatic.  Mouth/Throat: Oropharynx is clear and moist. No oropharyngeal exudate.  Eyes: Conjunctivae are normal. Right eye exhibits no discharge. Left eye exhibits no discharge. No scleral icterus.  Neck: Normal range of motion. Neck supple.  Cardiovascular: Normal rate, regular rhythm, normal heart sounds and intact distal pulses.   Pulmonary/Chest: Effort normal and breath sounds normal. No respiratory distress. No wheezes. No rales.  Abdominal: Soft. Bowel sounds are normal. Exhibits no distension and no mass. There is no tenderness.   Musculoskeletal: Normal range of motion. Exhibits no edema.  Lymphadenopathy:    No cervical adenopathy.  Neurological: Alert and oriented to person, place, and time. Exhibits normal muscle tone. Gait normal. Coordination normal.  Skin: Skin is warm and dry. No rash noted. Not diaphoretic. No erythema. No pallor.  Psychiatric: Mood, memory and judgment normal.  Vitals reviewed.  LABORATORY DATA: Lab Results  Component Value Date   WBC 4.2 04/05/2021   HGB 10.6 (L) 04/05/2021   HCT 32.0 (L) 04/05/2021   MCV 93.0 04/05/2021   PLT 271 04/05/2021      Chemistry      Component Value Date/Time   NA 138 04/05/2021 1105   K 3.8 04/05/2021 1105   CL 109 04/05/2021 1105   CO2 21 (L) 04/05/2021 1105   BUN 19 04/05/2021 1105   CREATININE 1.41 (H) 04/05/2021 1105      Component Value Date/Time   CALCIUM 9.8 04/05/2021 1105   ALKPHOS 86 04/05/2021 1105   AST 13 (L) 04/05/2021 1105   ALT 11 04/05/2021 1105   BILITOT 0.4 04/05/2021 1105       RADIOGRAPHIC STUDIES:  No results found.   ASSESSMENT/PLAN:  This is a very pleasant 71 year old African-American male  diagnosed with stage IIIa/C (T1c, N2-N3, M0) non-small cell lung cancer, squamous cell carcinoma.  He presented with a right upper lobe  lung nodule in addition to subcarinal lymphadenopathy and suspicious AP window lymph node involvement.  He was diagnosed in May 2022.  Also diagnosed with prostate adenocarcinoma with a Gleason score of 9.  He was diagnosed in April 2022.   The patient completed concurrent chemoradiation with carboplatin for an AUC of 2 and paclitaxel 45 mg per metered squared.  He is status post 7 cycles and tolerated it well.   The patient is currently undergoing consolidation immunotherapy with Imfinzi 1500 mg IV every 4 weeks.  He is status post 7 cycles and tolerated it well.   Labs were reviewed.  He has baseline CKD. Ok to treat with creatinine of 1.41 today. Recommend that he proceed with cycle #8  today scheduled.   We will see him back for a follow up visit in 4 weeks for evaluation before starting cycle #9  The patient was advised to call immediately if he has any concerning symptoms in the interval. The patient voices understanding of current disease status and treatment options and is in agreement with the current care plan. All questions were answered. The patient knows to call the clinic with any problems, questions or concerns. We can certainly see the patient much sooner if necessary      No orders of the defined types were placed in this encounter.    The total time spent in the appointment was 20-29 minutes.   Nimco Bivens L Prue Lingenfelter, PA-C 04/05/21

## 2021-04-05 ENCOUNTER — Inpatient Hospital Stay: Payer: Medicare PPO

## 2021-04-05 ENCOUNTER — Other Ambulatory Visit: Payer: Self-pay

## 2021-04-05 ENCOUNTER — Inpatient Hospital Stay: Payer: Medicare PPO | Admitting: Physician Assistant

## 2021-04-05 ENCOUNTER — Inpatient Hospital Stay: Payer: Medicare PPO | Attending: Internal Medicine

## 2021-04-05 VITALS — BP 143/85 | HR 93 | Temp 97.8°F | Resp 18 | Ht 70.0 in | Wt 196.6 lb

## 2021-04-05 DIAGNOSIS — C3491 Malignant neoplasm of unspecified part of right bronchus or lung: Secondary | ICD-10-CM

## 2021-04-05 DIAGNOSIS — C3411 Malignant neoplasm of upper lobe, right bronchus or lung: Secondary | ICD-10-CM | POA: Insufficient documentation

## 2021-04-05 DIAGNOSIS — R232 Flushing: Secondary | ICD-10-CM | POA: Diagnosis not present

## 2021-04-05 DIAGNOSIS — I129 Hypertensive chronic kidney disease with stage 1 through stage 4 chronic kidney disease, or unspecified chronic kidney disease: Secondary | ICD-10-CM | POA: Diagnosis not present

## 2021-04-05 DIAGNOSIS — Z923 Personal history of irradiation: Secondary | ICD-10-CM | POA: Insufficient documentation

## 2021-04-05 DIAGNOSIS — C61 Malignant neoplasm of prostate: Secondary | ICD-10-CM | POA: Insufficient documentation

## 2021-04-05 DIAGNOSIS — R590 Localized enlarged lymph nodes: Secondary | ICD-10-CM | POA: Insufficient documentation

## 2021-04-05 DIAGNOSIS — N189 Chronic kidney disease, unspecified: Secondary | ICD-10-CM | POA: Diagnosis not present

## 2021-04-05 DIAGNOSIS — Z5112 Encounter for antineoplastic immunotherapy: Secondary | ICD-10-CM | POA: Insufficient documentation

## 2021-04-05 DIAGNOSIS — Z79899 Other long term (current) drug therapy: Secondary | ICD-10-CM | POA: Insufficient documentation

## 2021-04-05 DIAGNOSIS — R5382 Chronic fatigue, unspecified: Secondary | ICD-10-CM

## 2021-04-05 LAB — CBC WITH DIFFERENTIAL (CANCER CENTER ONLY)
Abs Immature Granulocytes: 0.01 10*3/uL (ref 0.00–0.07)
Basophils Absolute: 0 10*3/uL (ref 0.0–0.1)
Basophils Relative: 1 %
Eosinophils Absolute: 0.4 10*3/uL (ref 0.0–0.5)
Eosinophils Relative: 8 %
HCT: 32 % — ABNORMAL LOW (ref 39.0–52.0)
Hemoglobin: 10.6 g/dL — ABNORMAL LOW (ref 13.0–17.0)
Immature Granulocytes: 0 %
Lymphocytes Relative: 24 %
Lymphs Abs: 1 10*3/uL (ref 0.7–4.0)
MCH: 30.8 pg (ref 26.0–34.0)
MCHC: 33.1 g/dL (ref 30.0–36.0)
MCV: 93 fL (ref 80.0–100.0)
Monocytes Absolute: 0.7 10*3/uL (ref 0.1–1.0)
Monocytes Relative: 17 %
Neutro Abs: 2.1 10*3/uL (ref 1.7–7.7)
Neutrophils Relative %: 50 %
Platelet Count: 271 10*3/uL (ref 150–400)
RBC: 3.44 MIL/uL — ABNORMAL LOW (ref 4.22–5.81)
RDW: 14.4 % (ref 11.5–15.5)
WBC Count: 4.2 10*3/uL (ref 4.0–10.5)
nRBC: 0 % (ref 0.0–0.2)

## 2021-04-05 LAB — CMP (CANCER CENTER ONLY)
ALT: 11 U/L (ref 0–44)
AST: 13 U/L — ABNORMAL LOW (ref 15–41)
Albumin: 4.1 g/dL (ref 3.5–5.0)
Alkaline Phosphatase: 86 U/L (ref 38–126)
Anion gap: 8 (ref 5–15)
BUN: 19 mg/dL (ref 8–23)
CO2: 21 mmol/L — ABNORMAL LOW (ref 22–32)
Calcium: 9.8 mg/dL (ref 8.9–10.3)
Chloride: 109 mmol/L (ref 98–111)
Creatinine: 1.41 mg/dL — ABNORMAL HIGH (ref 0.61–1.24)
GFR, Estimated: 53 mL/min — ABNORMAL LOW (ref >60)
Glucose, Bld: 107 mg/dL — ABNORMAL HIGH (ref 70–99)
Potassium: 3.8 mmol/L (ref 3.5–5.1)
Sodium: 138 mmol/L (ref 135–145)
Total Bilirubin: 0.4 mg/dL (ref 0.3–1.2)
Total Protein: 7.3 g/dL (ref 6.5–8.1)

## 2021-04-05 LAB — TSH: TSH: 0.442 u[IU]/mL (ref 0.320–4.118)

## 2021-04-05 MED ORDER — SODIUM CHLORIDE 0.9 % IV SOLN
1500.0000 mg | Freq: Once | INTRAVENOUS | Status: AC
  Administered 2021-04-05: 13:00:00 1500 mg via INTRAVENOUS
  Filled 2021-04-05: qty 30

## 2021-04-05 MED ORDER — SODIUM CHLORIDE 0.9 % IV SOLN: Freq: Once | INTRAVENOUS | Status: AC

## 2021-04-05 NOTE — Patient Instructions (Signed)
[facility]  Discharge Instructions: °Thank you for choosing [facility] to provide your oncology and hematology care.  ° °If you have a lab appointment with the Cancer Center, please go directly to the Cancer Center and check in at the registration area. °  °Wear comfortable clothing and clothing appropriate for easy access to any Portacath or PICC line.  ° °We strive to give you quality time with your provider. You may need to reschedule your appointment if you arrive late (15 or more minutes).  Arriving late affects you and other patients whose appointments are after yours.  Also, if you miss three or more appointments without notifying the office, you may be dismissed from the clinic at the provider’s discretion.    °  °For prescription refill requests, have your pharmacy contact our office and allow 72 hours for refills to be completed.   ° °Today you received the following chemotherapy and/or immunotherapy agents :  Durvalumab °    °  °To help prevent nausea and vomiting after your treatment, we encourage you to take your nausea medication as directed. ° °BELOW ARE SYMPTOMS THAT SHOULD BE REPORTED IMMEDIATELY: °*FEVER GREATER THAN 100.4 F (38 °C) OR HIGHER °*CHILLS OR SWEATING °*NAUSEA AND VOMITING THAT IS NOT CONTROLLED WITH YOUR NAUSEA MEDICATION °*UNUSUAL SHORTNESS OF BREATH °*UNUSUAL BRUISING OR BLEEDING °*URINARY PROBLEMS (pain or burning when urinating, or frequent urination) °*BOWEL PROBLEMS (unusual diarrhea, constipation, pain near the anus) °TENDERNESS IN MOUTH AND THROAT WITH OR WITHOUT PRESENCE OF ULCERS (sore throat, sores in mouth, or a toothache) °UNUSUAL RASH, SWELLING OR PAIN  °UNUSUAL VAGINAL DISCHARGE OR ITCHING  ° °Items with * indicate a potential emergency and should be followed up as soon as possible or go to the Emergency Department if any problems should occur. ° °Please show the CHEMOTHERAPY ALERT CARD or IMMUNOTHERAPY ALERT CARD at  check-in to the Emergency Department and triage nurse. ° °Should you have questions after your visit or need to cancel or reschedule your appointment, please contact [facility]  Dept: [phone]  and follow the prompts.  Office hours are 8:00 a.m. to 4:30 p.m. Monday - Friday. Please note that voicemails left after 4:00 p.m. may not be returned until the following business day.  We are closed weekends and major holidays. You have access to a nurse at all times for urgent questions. Please call the main number to the clinic Dept: [phone] and follow the prompts. ° ° °For any non-urgent questions, you may also contact your provider using MyChart. We now offer e-Visits for anyone 18 and older to request care online for non-urgent symptoms. For details visit [email or web address]. °  °Also download the MyChart app! Go to the app store, search "MyChart", open the app, select [facility], and log in with your MyChart username and password. ° °Due to Covid, a mask is required upon entering the hospital/clinic. If you do not have a mask, one will be given to you upon arrival. For doctor visits, patients may have 1 support person aged 18 or older with them. For treatment visits, patients cannot have anyone with them due to current Covid guidelines and our immunocompromised population.  ° °

## 2021-04-13 ENCOUNTER — Telehealth: Payer: Self-pay

## 2021-04-13 NOTE — Telephone Encounter (Signed)
Pts wife left a message wanting to let Dr. Julien Nordmann know that another of pts sons has passed away. She indicated it's "just an FYI because he won't mention it". ?

## 2021-05-02 ENCOUNTER — Telehealth: Payer: Self-pay | Admitting: Internal Medicine

## 2021-05-02 NOTE — Telephone Encounter (Signed)
Called patient regarding upcoming appointments, left a voicemail. 

## 2021-05-03 ENCOUNTER — Encounter: Payer: Self-pay | Admitting: Internal Medicine

## 2021-05-03 ENCOUNTER — Other Ambulatory Visit: Payer: Self-pay

## 2021-05-03 ENCOUNTER — Inpatient Hospital Stay: Payer: Medicare PPO | Attending: Internal Medicine

## 2021-05-03 ENCOUNTER — Inpatient Hospital Stay: Payer: Medicare PPO | Admitting: Internal Medicine

## 2021-05-03 ENCOUNTER — Inpatient Hospital Stay: Payer: Medicare PPO

## 2021-05-03 VITALS — BP 131/78 | HR 80 | Temp 97.0°F | Resp 18 | Wt 202.0 lb

## 2021-05-03 DIAGNOSIS — C3491 Malignant neoplasm of unspecified part of right bronchus or lung: Secondary | ICD-10-CM

## 2021-05-03 DIAGNOSIS — C61 Malignant neoplasm of prostate: Secondary | ICD-10-CM | POA: Insufficient documentation

## 2021-05-03 DIAGNOSIS — C3490 Malignant neoplasm of unspecified part of unspecified bronchus or lung: Secondary | ICD-10-CM

## 2021-05-03 DIAGNOSIS — Z79899 Other long term (current) drug therapy: Secondary | ICD-10-CM | POA: Insufficient documentation

## 2021-05-03 DIAGNOSIS — Z5112 Encounter for antineoplastic immunotherapy: Secondary | ICD-10-CM | POA: Diagnosis not present

## 2021-05-03 DIAGNOSIS — I10 Essential (primary) hypertension: Secondary | ICD-10-CM | POA: Insufficient documentation

## 2021-05-03 DIAGNOSIS — Z923 Personal history of irradiation: Secondary | ICD-10-CM | POA: Insufficient documentation

## 2021-05-03 DIAGNOSIS — R590 Localized enlarged lymph nodes: Secondary | ICD-10-CM | POA: Insufficient documentation

## 2021-05-03 DIAGNOSIS — C3411 Malignant neoplasm of upper lobe, right bronchus or lung: Secondary | ICD-10-CM | POA: Insufficient documentation

## 2021-05-03 DIAGNOSIS — R5382 Chronic fatigue, unspecified: Secondary | ICD-10-CM

## 2021-05-03 LAB — CBC WITH DIFFERENTIAL (CANCER CENTER ONLY)
Abs Immature Granulocytes: 0.02 10*3/uL (ref 0.00–0.07)
Basophils Absolute: 0 10*3/uL (ref 0.0–0.1)
Basophils Relative: 1 %
Eosinophils Absolute: 0.5 10*3/uL (ref 0.0–0.5)
Eosinophils Relative: 10 %
HCT: 33 % — ABNORMAL LOW (ref 39.0–52.0)
Hemoglobin: 10.7 g/dL — ABNORMAL LOW (ref 13.0–17.0)
Immature Granulocytes: 1 %
Lymphocytes Relative: 26 %
Lymphs Abs: 1.2 10*3/uL (ref 0.7–4.0)
MCH: 30.7 pg (ref 26.0–34.0)
MCHC: 32.4 g/dL (ref 30.0–36.0)
MCV: 94.6 fL (ref 80.0–100.0)
Monocytes Absolute: 0.5 10*3/uL (ref 0.1–1.0)
Monocytes Relative: 12 %
Neutro Abs: 2.2 10*3/uL (ref 1.7–7.7)
Neutrophils Relative %: 50 %
Platelet Count: 259 10*3/uL (ref 150–400)
RBC: 3.49 MIL/uL — ABNORMAL LOW (ref 4.22–5.81)
RDW: 14.7 % (ref 11.5–15.5)
WBC Count: 4.4 10*3/uL (ref 4.0–10.5)
nRBC: 0 % (ref 0.0–0.2)

## 2021-05-03 LAB — CMP (CANCER CENTER ONLY)
ALT: 13 U/L (ref 0–44)
AST: 15 U/L (ref 15–41)
Albumin: 4 g/dL (ref 3.5–5.0)
Alkaline Phosphatase: 87 U/L (ref 38–126)
Anion gap: 7 (ref 5–15)
BUN: 24 mg/dL — ABNORMAL HIGH (ref 8–23)
CO2: 22 mmol/L (ref 22–32)
Calcium: 9.4 mg/dL (ref 8.9–10.3)
Chloride: 110 mmol/L (ref 98–111)
Creatinine: 1.59 mg/dL — ABNORMAL HIGH (ref 0.61–1.24)
GFR, Estimated: 46 mL/min — ABNORMAL LOW (ref >60)
Glucose, Bld: 100 mg/dL — ABNORMAL HIGH (ref 70–99)
Potassium: 3.7 mmol/L (ref 3.5–5.1)
Sodium: 139 mmol/L (ref 135–145)
Total Bilirubin: 0.3 mg/dL (ref 0.3–1.2)
Total Protein: 7.3 g/dL (ref 6.5–8.1)

## 2021-05-03 LAB — TSH: TSH: 0.694 u[IU]/mL (ref 0.320–4.118)

## 2021-05-03 MED ORDER — SODIUM CHLORIDE 0.9 % IV SOLN
1500.0000 mg | Freq: Once | INTRAVENOUS | Status: AC
  Administered 2021-05-03: 1500 mg via INTRAVENOUS
  Filled 2021-05-03: qty 30

## 2021-05-03 MED ORDER — SODIUM CHLORIDE 0.9 % IV SOLN: Freq: Once | INTRAVENOUS | Status: AC

## 2021-05-03 NOTE — Progress Notes (Signed)
Per Dr. Julien Nordmann ,it is okay to treat pt today with Durvalumab and serum creatinine of 1.59. ?

## 2021-05-03 NOTE — Progress Notes (Signed)
?    [facility] ?[phone]   [phone] ? ?OFFICE PROGRESS NOTE ? ?Lujean Amel, MD ?[address] ?[place] [address] ? 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 ?  ?PRIOR THERAPY: Concurrent chemoradiation with weekly carboplatin for AUC of 2 and paclitaxel 45 Mg/M2. Status post 7 cycles. First dose on 07/04/20 and last cycle was giving August 14, 2020 with partial response.  ?  ?CURRENT THERAPY: Consolidation treatment with immunotherapy with Imfinzi 1500 Mg IV every 4 weeks starting September 13, 2020.  Status post 8 cycles. ? ?INTERVAL HISTORY: ?Jason [name]. 71 y.o. male returns to the clinic today for follow-up visit.  The patient is feeling fine today with no concerning complaints.  He denied having any current chest pain, shortness of breath, cough or hemoptysis.  He denied having any fever or chills.  He has no nausea, vomiting, diarrhea or constipation.  He denied having any headache or visual changes.  He has no weight loss or night sweats.  He continues to tolerate his treatment with Imfinzi fairly well.  The patient is here today for evaluation before starting cycle #9 of his treatment. ? ? ?MEDICAL HISTORY: ?Past Medical History:  ?Diagnosis Date  ? Arthritis   ? back  ? Hypertension   ? Prostate cancer ([place])   ? Tobacco use   ? ? ?ALLERGIES:  has No Known Allergies. ? ?MEDICATIONS:  ?Current Outpatient Medications  ?Medication Sig Dispense Refill  ? acetaminophen (TYLENOL) 325 MG tablet Take 650 mg by mouth every 6 (six) hours as needed.    ? atorvastatin (LIPITOR) 10 MG tablet Take 10 mg by mouth daily.    ? azelastine (ASTELIN) 0.1 % nasal spray Place 1 spray into both  nostrils 2 (two) times daily as needed for rhinitis. Use in each nostril as directed    ? fluticasone (FLONASE) 50 MCG/ACT nasal spray Place 2 sprays into both nostrils daily.    ? levocetirizine (XYZAL) 5 MG tablet Take 5 mg by mouth every evening.    ? montelukast (SINGULAIR) 10 MG tablet Take 10 mg by mouth at bedtime.    ? valsartan-hydrochlorothiazide (DIOVAN-HCT) 320-25 MG tablet Take 1 tablet by mouth daily.    ? venlafaxine (EFFEXOR) 25 MG tablet 1 tablet with food    ? ?No current facility-administered medications for this visit.  ? ? ?SURGICAL HISTORY:  ?Past Surgical History:  ?Procedure Laterality Date  ? BACK SURGERY    ? BRONCHIAL BIOPSY  06/06/2020  ? Procedure: BRONCHIAL BIOPSIES;  Surgeon: Garner Nash, DO;  Location: [facility];  Service: Pulmonary;;  ? BRONCHIAL BRUSHINGS  06/06/2020  ? Procedure: BRONCHIAL BRUSHINGS;  Surgeon: Garner Nash, DO;  Location: [facility];  Service: Pulmonary;;  ? BRONCHIAL NEEDLE ASPIRATION BIOPSY  06/06/2020  ? Procedure: BRONCHIAL NEEDLE ASPIRATION BIOPSIES;  Surgeon: Garner Nash, DO;  Location: [facility];  Service: Pulmonary;;  ? BRONCHIAL WASHINGS  06/06/2020  ? Procedure: BRONCHIAL WASHINGS;  Surgeon: Garner Nash, DO;  Location: [facility];  Service: Pulmonary;;  ? COLONOSCOPY    ? OTHER SURGICAL HISTORY    ? fluid drawn off his testicle  ? PROSTATE BIOPSY    ? ROTATOR CUFF REPAIR Right   ?  VIDEO BRONCHOSCOPY WITH ENDOBRONCHIAL NAVIGATION Right 06/06/2020  ? Procedure: VIDEO BRONCHOSCOPY WITH ENDOBRONCHIAL NAVIGATION;  Surgeon: Garner Nash, DO;  Location: [facility];  Service: Pulmonary;  Laterality: Right;  ? VIDEO BRONCHOSCOPY WITH ENDOBRONCHIAL ULTRASOUND Bilateral 06/06/2020  ? Procedure: VIDEO BRONCHOSCOPY WITH ENDOBRONCHIAL ULTRASOUND;  Surgeon: Garner Nash, DO;  Location: [facility];  Service: Pulmonary;  Laterality: Bilateral;  ? WRIST SURGERY    ? ? ?REVIEW OF SYSTEMS:  A comprehensive review of systems was negative.   ? ?PHYSICAL EXAMINATION: General appearance: alert, cooperative, and no distress ?Head: Normocephalic, without obvious abnormality, atraumatic ?Neck: no adenopathy, no JVD, supple, symmetrical, trachea midline, and thyroid not enlarged, symmetric, no tenderness/mass/nodules ?Lymph nodes: Cervical, supraclavicular, and axillary nodes normal. ?Resp: clear to auscultation bilaterally ?Back: symmetric, no curvature. ROM normal. No CVA tenderness. ?Cardio: regular rate and rhythm, S1, S2 normal, no murmur, click, rub or gallop ?GI: soft, non-tender; bowel sounds normal; no masses,  no organomegaly ?Extremities: extremities normal, atraumatic, no cyanosis or edema ? ?ECOG PERFORMANCE STATUS: 1 - Symptomatic but completely ambulatory ? ?Blood pressure 131/78, pulse 80, temperature (!) 97 ?F (36.1 ?C), temperature source Tympanic, resp. rate 18, weight 202 lb (91.6 kg), SpO2 99 %. ? ?LABORATORY DATA: ?Lab Results  ?Component Value Date  ? WBC 4.4 05/03/2021  ? HGB 10.7 (L) 05/03/2021  ? HCT 33.0 (L) 05/03/2021  ? MCV 94.6 05/03/2021  ? PLT 259 05/03/2021  ? ? ?  Chemistry   ?   ?Component Value Date/Time  ? NA 138 04/05/2021 1105  ? K 3.8 04/05/2021 1105  ? CL 109 04/05/2021 1105  ? CO2 21 (L) 04/05/2021 1105  ? BUN 19 04/05/2021 1105  ? CREATININE 1.41 (H) 04/05/2021 1105  ?    ?Component Value Date/Time  ? CALCIUM 9.8 04/05/2021 1105  ? ALKPHOS 86 04/05/2021 1105  ? AST 13 (L) 04/05/2021 1105  ? ALT 11 04/05/2021 1105  ? BILITOT 0.4 04/05/2021 1105  ?  ? ? ? ?RADIOGRAPHIC STUDIES: ?No results found. ? ?ASSESSMENT AND PLAN: This is a very pleasant 71 years old African-American male recently diagnosed with a stage IIIB (T1c, N3, M0) non-small cell lung cancer, squamous cell carcinoma presented with right upper lobe lung nodule in addition to subcarinal and suspicious AP window lymphadenopathy diagnosed in May 2022.  The patient also has a history of prostate adenocarcinoma diagnosed in April 2022. ?He underwent a course of  concurrent chemoradiation with weekly carboplatin for AUC of 2 and paclitaxel 45 Mg/M2 status post 7 cycles.  The patient tolerated this course of concurrent chemoradiation fairly well with no concerning adverse effects. ?The patient is currently on consolidation treatment with immunotherapy with Imfinzi 1500 Mg IV every 4 weeks status post 8 cycles. ?He continues to tolerate this treatment well with no concerning adverse effects. ?I recommended for him to proceed with cycle #9 today as planned. ?I will see him back for follow-up visit in 4 weeks for evaluation with repeat CT scan of the chest for restaging of his disease. ?The patient was advised to call immediately if he has any other concerning symptoms in the interval. ?The patient voices understanding of current disease status and treatment options and is in agreement with the current care plan. ? ?All questions were answered. The patient knows to call the clinic with any problems, questions or concerns. We can certainly see the patient much sooner if necessary. ? ?Disclaimer: This note was dictated with voice recognition software. Similar sounding words can inadvertently  be transcribed and may not be corrected upon review. ? ? ?  ?   ?

## 2021-05-03 NOTE — Patient Instructions (Signed)
[name]  Discharge Instructions: °Thank you for choosing [facility] to provide your oncology and hematology care.  ° °If you have a lab appointment with the Cancer Center, please go directly to the Cancer Center and check in at the registration area. °  °Wear comfortable clothing and clothing appropriate for easy access to any Portacath or PICC line.  ° °We strive to give you quality time with your provider. You may need to reschedule your appointment if you arrive late (15 or more minutes).  Arriving late affects you and other patients whose appointments are after yours.  Also, if you miss three or more appointments without notifying the office, you may be dismissed from the clinic at the provider’s discretion.    °  °For prescription refill requests, have your pharmacy contact our office and allow 72 hours for refills to be completed.   ° °Today you received the following chemotherapy and/or immunotherapy agents :  Durvalumab °    °  °To help prevent nausea and vomiting after your treatment, we encourage you to take your nausea medication as directed. ° °BELOW ARE SYMPTOMS THAT SHOULD BE REPORTED IMMEDIATELY: °*FEVER GREATER THAN 100.4 F (38 °C) OR HIGHER °*CHILLS OR SWEATING °*NAUSEA AND VOMITING THAT IS NOT CONTROLLED WITH YOUR NAUSEA MEDICATION °*UNUSUAL SHORTNESS OF BREATH °*UNUSUAL BRUISING OR BLEEDING °*URINARY PROBLEMS (pain or burning when urinating, or frequent urination) °*BOWEL PROBLEMS (unusual diarrhea, constipation, pain near the anus) °TENDERNESS IN MOUTH AND THROAT WITH OR WITHOUT PRESENCE OF ULCERS (sore throat, sores in mouth, or a toothache) °UNUSUAL RASH, SWELLING OR PAIN  °UNUSUAL VAGINAL DISCHARGE OR ITCHING  ° °Items with * indicate a potential emergency and should be followed up as soon as possible or go to the Emergency Department if any problems should occur. ° °Please show the CHEMOTHERAPY ALERT CARD or IMMUNOTHERAPY ALERT CARD at  check-in to the Emergency Department and triage nurse. ° °Should you have questions after your visit or need to cancel or reschedule your appointment, please contact [facility]  Dept: [phone]  and follow the prompts.  Office hours are 8:00 a.m. to 4:30 p.m. Monday - Friday. Please note that voicemails left after 4:00 p.m. may not be returned until the following business day.  We are closed weekends and major holidays. You have access to a nurse at all times for urgent questions. Please call the main number to the clinic Dept: [phone] and follow the prompts. ° ° °For any non-urgent questions, you may also contact your provider using MyChart. We now offer e-Visits for anyone 18 and older to request care online for non-urgent symptoms. For details visit [email or web address]. °  °Also download the MyChart app! Go to the app store, search "MyChart", open the app, select [facility], and log in with your MyChart username and password. ° °Due to Covid, a mask is required upon entering the hospital/clinic. If you do not have a mask, one will be given to you upon arrival. For doctor visits, patients may have 1 support person aged 18 or older with them. For treatment visits, patients cannot have anyone with them due to current Covid guidelines and our immunocompromised population.  ° °

## 2021-05-29 ENCOUNTER — Ambulatory Visit (HOSPITAL_COMMUNITY)
Admission: RE | Admit: 2021-05-29 | Discharge: 2021-05-29 | Disposition: A | Discharge status: Home / Self Care | Payer: Medicare PPO | Source: Ambulatory Visit | Attending: Internal Medicine | Admitting: Internal Medicine

## 2021-05-29 DIAGNOSIS — J439 Emphysema, unspecified: Secondary | ICD-10-CM | POA: Diagnosis not present

## 2021-05-29 DIAGNOSIS — C3490 Malignant neoplasm of unspecified part of unspecified bronchus or lung: Secondary | ICD-10-CM | POA: Diagnosis not present

## 2021-05-29 DIAGNOSIS — I700 Atherosclerosis of aorta: Secondary | ICD-10-CM | POA: Diagnosis not present

## 2021-05-29 DIAGNOSIS — R918 Other nonspecific abnormal finding of lung field: Secondary | ICD-10-CM | POA: Diagnosis not present

## 2021-05-31 ENCOUNTER — Inpatient Hospital Stay: Payer: Medicare PPO

## 2021-05-31 ENCOUNTER — Inpatient Hospital Stay (HOSPITAL_BASED_OUTPATIENT_CLINIC_OR_DEPARTMENT_OTHER): Payer: Medicare PPO | Admitting: Internal Medicine

## 2021-05-31 ENCOUNTER — Other Ambulatory Visit: Payer: Self-pay

## 2021-05-31 ENCOUNTER — Encounter: Payer: Self-pay | Admitting: Internal Medicine

## 2021-05-31 ENCOUNTER — Inpatient Hospital Stay: Payer: Medicare PPO | Attending: Internal Medicine

## 2021-05-31 VITALS — BP 121/85 | HR 89 | Resp 16 | Wt 203.1 lb

## 2021-05-31 DIAGNOSIS — C3491 Malignant neoplasm of unspecified part of right bronchus or lung: Secondary | ICD-10-CM

## 2021-05-31 DIAGNOSIS — C3411 Malignant neoplasm of upper lobe, right bronchus or lung: Secondary | ICD-10-CM | POA: Diagnosis not present

## 2021-05-31 DIAGNOSIS — J479 Bronchiectasis, uncomplicated: Secondary | ICD-10-CM | POA: Insufficient documentation

## 2021-05-31 DIAGNOSIS — J439 Emphysema, unspecified: Secondary | ICD-10-CM | POA: Diagnosis not present

## 2021-05-31 DIAGNOSIS — Z923 Personal history of irradiation: Secondary | ICD-10-CM | POA: Diagnosis not present

## 2021-05-31 DIAGNOSIS — C61 Malignant neoplasm of prostate: Secondary | ICD-10-CM | POA: Insufficient documentation

## 2021-05-31 DIAGNOSIS — R590 Localized enlarged lymph nodes: Secondary | ICD-10-CM | POA: Insufficient documentation

## 2021-05-31 DIAGNOSIS — I10 Essential (primary) hypertension: Secondary | ICD-10-CM | POA: Insufficient documentation

## 2021-05-31 DIAGNOSIS — Z9221 Personal history of antineoplastic chemotherapy: Secondary | ICD-10-CM | POA: Diagnosis not present

## 2021-05-31 DIAGNOSIS — Z5112 Encounter for antineoplastic immunotherapy: Secondary | ICD-10-CM

## 2021-05-31 DIAGNOSIS — I700 Atherosclerosis of aorta: Secondary | ICD-10-CM | POA: Insufficient documentation

## 2021-05-31 DIAGNOSIS — Z79899 Other long term (current) drug therapy: Secondary | ICD-10-CM | POA: Insufficient documentation

## 2021-05-31 DIAGNOSIS — R5382 Chronic fatigue, unspecified: Secondary | ICD-10-CM

## 2021-05-31 LAB — CMP (CANCER CENTER ONLY)
ALT: 14 U/L (ref 0–44)
AST: 15 U/L (ref 15–41)
Albumin: 4.1 g/dL (ref 3.5–5.0)
Alkaline Phosphatase: 92 U/L (ref 38–126)
Anion gap: 7 (ref 5–15)
BUN: 39 mg/dL — ABNORMAL HIGH (ref 8–23)
CO2: 21 mmol/L — ABNORMAL LOW (ref 22–32)
Calcium: 9.5 mg/dL (ref 8.9–10.3)
Chloride: 110 mmol/L (ref 98–111)
Creatinine: 1.72 mg/dL — ABNORMAL HIGH (ref 0.61–1.24)
GFR, Estimated: 42 mL/min — ABNORMAL LOW (ref >60)
Glucose, Bld: 76 mg/dL (ref 70–99)
Potassium: 4.1 mmol/L (ref 3.5–5.1)
Sodium: 138 mmol/L (ref 135–145)
Total Bilirubin: 0.3 mg/dL (ref 0.3–1.2)
Total Protein: 7.4 g/dL (ref 6.5–8.1)

## 2021-05-31 LAB — CBC WITH DIFFERENTIAL (CANCER CENTER ONLY)
Abs Immature Granulocytes: 0.02 10*3/uL (ref 0.00–0.07)
Basophils Absolute: 0 10*3/uL (ref 0.0–0.1)
Basophils Relative: 1 %
Eosinophils Absolute: 0.3 10*3/uL (ref 0.0–0.5)
Eosinophils Relative: 7 %
HCT: 33.7 % — ABNORMAL LOW (ref 39.0–52.0)
Hemoglobin: 11.1 g/dL — ABNORMAL LOW (ref 13.0–17.0)
Immature Granulocytes: 1 %
Lymphocytes Relative: 26 %
Lymphs Abs: 1 10*3/uL (ref 0.7–4.0)
MCH: 30.6 pg (ref 26.0–34.0)
MCHC: 32.9 g/dL (ref 30.0–36.0)
MCV: 92.8 fL (ref 80.0–100.0)
Monocytes Absolute: 0.6 10*3/uL (ref 0.1–1.0)
Monocytes Relative: 16 %
Neutro Abs: 1.9 10*3/uL (ref 1.7–7.7)
Neutrophils Relative %: 49 %
Platelet Count: 235 10*3/uL (ref 150–400)
RBC: 3.63 MIL/uL — ABNORMAL LOW (ref 4.22–5.81)
RDW: 14.3 % (ref 11.5–15.5)
WBC Count: 4 10*3/uL (ref 4.0–10.5)
nRBC: 0 % (ref 0.0–0.2)

## 2021-05-31 LAB — TSH: TSH: 0.839 u[IU]/mL (ref 0.350–4.500)

## 2021-05-31 MED ORDER — SODIUM CHLORIDE 0.9 % IV SOLN
1500.0000 mg | Freq: Once | INTRAVENOUS | Status: AC
  Administered 2021-05-31: 1500 mg via INTRAVENOUS
  Filled 2021-05-31: qty 30

## 2021-05-31 MED ORDER — SODIUM CHLORIDE 0.9 % IV SOLN: Freq: Once | INTRAVENOUS | Status: AC

## 2021-05-31 NOTE — Progress Notes (Signed)
?    [facility] ?[phone]   [phone] ? ?OFFICE PROGRESS NOTE ? ?Lujean Amel, MD ?[address] ?[place] [address] ? 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 ?  ?PRIOR THERAPY: Concurrent chemoradiation with weekly carboplatin for AUC of 2 and paclitaxel 45 Mg/M2. Status post 7 cycles. First dose on 07/04/20 and last cycle was giving August 14, 2020 with partial response.  ?  ?CURRENT THERAPY: Consolidation treatment with immunotherapy with Imfinzi 1500 Mg IV every 4 weeks starting September 13, 2020.  Status post 9 cycles. ? ?INTERVAL HISTORY: ?Jason [name]. 71 y.o. male returns to the clinic today for follow-up visit.  The patient is feeling fine today with no concerning complaints.  He denied having any chest pain, shortness of breath, cough or hemoptysis.  He has no nausea, vomiting, diarrhea or constipation.  He has no headache or visual changes.  He has no recent weight loss or night sweats.  He has been tolerating his treatment with Imfinzi fairly well.  The patient is here today for evaluation with repeat CT scan of the chest before starting cycle #10. ? ?MEDICAL HISTORY: ?Past Medical History:  ?Diagnosis Date  ? Arthritis   ? back  ? Hypertension   ? Prostate cancer ([facility])   ? Tobacco use   ? ? ?ALLERGIES:  has No Known Allergies. ? ?MEDICATIONS:  ?Current Outpatient Medications  ?Medication Sig Dispense Refill  ? acetaminophen (TYLENOL) 325 MG tablet Take 650 mg by mouth every 6 (six) hours as needed.    ? atorvastatin (LIPITOR) 10 MG tablet Take 10 mg by mouth daily.    ? azelastine (ASTELIN) 0.1 % nasal spray Place 1 spray into both nostrils 2 (two) times daily as needed for  rhinitis. Use in each nostril as directed    ? fluticasone (FLONASE) 50 MCG/ACT nasal spray Place 2 sprays into both nostrils daily.    ? levocetirizine (XYZAL) 5 MG tablet Take 5 mg by mouth every evening.    ? montelukast (SINGULAIR) 10 MG tablet Take 10 mg by mouth at bedtime.    ? valsartan-hydrochlorothiazide (DIOVAN-HCT) 320-25 MG tablet Take 1 tablet by mouth daily.    ? venlafaxine (EFFEXOR) 25 MG tablet 1 tablet with food    ? ?No current facility-administered medications for this visit.  ? ? ?SURGICAL HISTORY:  ?Past Surgical History:  ?Procedure Laterality Date  ? BACK SURGERY    ? BRONCHIAL BIOPSY  06/06/2020  ? Procedure: BRONCHIAL BIOPSIES;  Surgeon: Garner Nash, DO;  Location: [facility];  Service: Pulmonary;;  ? BRONCHIAL BRUSHINGS  06/06/2020  ? Procedure: BRONCHIAL BRUSHINGS;  Surgeon: Garner Nash, DO;  Location: [facility];  Service: Pulmonary;;  ? BRONCHIAL NEEDLE ASPIRATION BIOPSY  06/06/2020  ? Procedure: BRONCHIAL NEEDLE ASPIRATION BIOPSIES;  Surgeon: Garner Nash, DO;  Location: [facility];  Service: Pulmonary;;  ? BRONCHIAL WASHINGS  06/06/2020  ? Procedure: BRONCHIAL WASHINGS;  Surgeon: Garner Nash, DO;  Location: [facility];  Service: Pulmonary;;  ? COLONOSCOPY    ? OTHER SURGICAL HISTORY    ? fluid drawn off his testicle  ? PROSTATE BIOPSY    ? ROTATOR CUFF REPAIR Right   ? VIDEO BRONCHOSCOPY WITH ENDOBRONCHIAL  NAVIGATION Right 06/06/2020  ? Procedure: VIDEO BRONCHOSCOPY WITH ENDOBRONCHIAL NAVIGATION;  Surgeon: Garner Nash, DO;  Location: [facility];  Service: Pulmonary;  Laterality: Right;  ? VIDEO BRONCHOSCOPY WITH ENDOBRONCHIAL ULTRASOUND Bilateral 06/06/2020  ? Procedure: VIDEO BRONCHOSCOPY WITH ENDOBRONCHIAL ULTRASOUND;  Surgeon: Garner Nash, DO;  Location: [facility];  Service: Pulmonary;  Laterality: Bilateral;  ? WRIST SURGERY    ? ? ?REVIEW OF SYSTEMS:  Constitutional: negative ?Eyes: negative ?Ears, nose, mouth, throat, and face:  negative ?Respiratory: negative ?Cardiovascular: negative ?Gastrointestinal: negative ?Genitourinary:negative ?Integument/breast: negative ?Hematologic/lymphatic: negative ?Musculoskeletal:negative ?Neurological: negative ?Behavioral/Psych: negative ?Endocrine: negative ?Allergic/Immunologic: negative  ? ?PHYSICAL EXAMINATION: General appearance: alert, cooperative, and no distress ?Head: Normocephalic, without obvious abnormality, atraumatic ?Neck: no adenopathy, no JVD, supple, symmetrical, trachea midline, and thyroid not enlarged, symmetric, no tenderness/mass/nodules ?Lymph nodes: Cervical, supraclavicular, and axillary nodes normal. ?Resp: clear to auscultation bilaterally ?Back: symmetric, no curvature. ROM normal. No CVA tenderness. ?Cardio: regular rate and rhythm, S1, S2 normal, no murmur, click, rub or gallop ?GI: soft, non-tender; bowel sounds normal; no masses,  no organomegaly ?Extremities: extremities normal, atraumatic, no cyanosis or edema ?Neurologic: Alert and oriented X 3, normal strength and tone. Normal symmetric reflexes. Normal coordination and gait ? ?ECOG PERFORMANCE STATUS: 1 - Symptomatic but completely ambulatory ? ?Blood pressure 121/85, pulse 89, resp. rate 16, weight 203 lb 1 oz (92.1 kg), SpO2 99 %. ? ?LABORATORY DATA: ?Lab Results  ?Component Value Date  ? WBC 4.0 05/31/2021  ? HGB 11.1 (L) 05/31/2021  ? HCT 33.7 (L) 05/31/2021  ? MCV 92.8 05/31/2021  ? PLT 235 05/31/2021  ? ? ?  Chemistry   ?   ?Component Value Date/Time  ? NA 139 05/03/2021 1014  ? K 3.7 05/03/2021 1014  ? CL 110 05/03/2021 1014  ? CO2 22 05/03/2021 1014  ? BUN 24 (H) 05/03/2021 1014  ? CREATININE 1.59 (H) 05/03/2021 1014  ?    ?Component Value Date/Time  ? CALCIUM 9.4 05/03/2021 1014  ? ALKPHOS 87 05/03/2021 1014  ? AST 15 05/03/2021 1014  ? ALT 13 05/03/2021 1014  ? BILITOT 0.3 05/03/2021 1014  ?  ? ? ? ?RADIOGRAPHIC STUDIES: ?CT Chest Wo Contrast ? ?Result Date: 05/29/2021 ?CLINICAL DATA:  Non-small cell lung  cancer staging; * Tracking Code: BO * EXAM: CT CHEST WITHOUT CONTRAST TECHNIQUE: Multidetector CT imaging of the chest was performed following the standard protocol without IV contrast. RADIATION DOSE REDUCTION: This exam was performed according to the departmental dose-optimization program which includes automated exposure control, adjustment of the mA and/or kV according to patient size and/or use of iterative reconstruction technique. COMPARISON:  Chest CT without dated March 05, 2021 FINDINGS: Cardiovascular: Normal heart size. No pericardial effusion. Normal caliber thoracic aorta with atherosclerotic disease. Mediastinum/Nodes: Esophagus and thyroid are unremarkable. No pathologically enlarged lymph nodes seen in the chest. Lungs/Pleura: Central airways are patent. Mild paraseptal emphysema. Right upper lobe pulmonary nodule measures 10 x 8 mm on series 5, image 22, previously 14 x 11 mm, surrounding ground-glass and reticular opacities, likely post radiation change. Lower lung predominant peribronchovascular and reticular opacities with associated traction bronchiectasis, unchanged when compared with prior exam. No new or enlarging pulmonary nodules. Upper Abdomen: Stable left adrenal gland nodule, likely benign adenoma. No acute abnormality. Musculoskeletal: No chest wall mass or suspicious bone lesions identified. IMPRESSION: 1. Decreased size of right upper lobe pulmonary nodule with similar surrounding post radiation change. No evidence of recurrent or metastatic disease. 2. Aortic Atherosclerosis (ICD10-I70.0) and  Emphysema (ICD10-J43.9). Electronically Signed   By: Yetta Glassman M.D.   On: 05/29/2021 16:44   ? ?ASSESSMENT AND PLAN: This is a very pleasant 71 years old African-American male recently diagnosed with a stage IIIB (T1c, N3, M0) non-small cell lung cancer, squamous cell carcinoma presented with right upper lobe lung nodule in addition to subcarinal and suspicious AP window  lymphadenopathy diagnosed in May 2022.  The patient also has a history of prostate adenocarcinoma diagnosed in April 2022. ?He underwent a course of concurrent chemoradiation with weekly carboplatin for AUC of 2 and paclitaxel 45 Mg/

## 2021-05-31 NOTE — Patient Instructions (Signed)
[facility]  Discharge Instructions: °Thank you for choosing [facility] to provide your oncology and hematology care.  ° °If you have a lab appointment with the Cancer Center, please go directly to the Cancer Center and check in at the registration area. °  °Wear comfortable clothing and clothing appropriate for easy access to any Portacath or PICC line.  ° °We strive to give you quality time with your provider. You may need to reschedule your appointment if you arrive late (15 or more minutes).  Arriving late affects you and other patients whose appointments are after yours.  Also, if you miss three or more appointments without notifying the office, you may be dismissed from the clinic at the provider’s discretion.    °  °For prescription refill requests, have your pharmacy contact our office and allow 72 hours for refills to be completed.   ° °Today you received the following chemotherapy and/or immunotherapy agents :  Durvalumab °    °  °To help prevent nausea and vomiting after your treatment, we encourage you to take your nausea medication as directed. ° °BELOW ARE SYMPTOMS THAT SHOULD BE REPORTED IMMEDIATELY: °*FEVER GREATER THAN 100.4 F (38 °C) OR HIGHER °*CHILLS OR SWEATING °*NAUSEA AND VOMITING THAT IS NOT CONTROLLED WITH YOUR NAUSEA MEDICATION °*UNUSUAL SHORTNESS OF BREATH °*UNUSUAL BRUISING OR BLEEDING °*URINARY PROBLEMS (pain or burning when urinating, or frequent urination) °*BOWEL PROBLEMS (unusual diarrhea, constipation, pain near the anus) °TENDERNESS IN MOUTH AND THROAT WITH OR WITHOUT PRESENCE OF ULCERS (sore throat, sores in mouth, or a toothache) °UNUSUAL RASH, SWELLING OR PAIN  °UNUSUAL VAGINAL DISCHARGE OR ITCHING  ° °Items with * indicate a potential emergency and should be followed up as soon as possible or go to the Emergency Department if any problems should occur. ° °Please show the CHEMOTHERAPY ALERT CARD or IMMUNOTHERAPY ALERT CARD at  check-in to the Emergency Department and triage nurse. ° °Should you have questions after your visit or need to cancel or reschedule your appointment, please contact [facility]  Dept: [phone]  and follow the prompts.  Office hours are 8:00 a.m. to 4:30 p.m. Monday - Friday. Please note that voicemails left after 4:00 p.m. may not be returned until the following business day.  We are closed weekends and major holidays. You have access to a nurse at all times for urgent questions. Please call the main number to the clinic Dept: [phone] and follow the prompts. ° ° °For any non-urgent questions, you may also contact your provider using MyChart. We now offer e-Visits for anyone 18 and older to request care online for non-urgent symptoms. For details visit [email or web address]. °  °Also download the MyChart app! Go to the app store, search "MyChart", open the app, select [facility], and log in with your MyChart username and password. ° °Due to Covid, a mask is required upon entering the hospital/clinic. If you do not have a mask, one will be given to you upon arrival. For doctor visits, patients may have 1 support person aged 18 or older with them. For treatment visits, patients cannot have anyone with them due to current Covid guidelines and our immunocompromised population.  ° °

## 2021-05-31 NOTE — Progress Notes (Signed)
Per Dr. Julien Nordmann, pt okay to treat today with SCR 1.72. ?

## 2021-06-28 ENCOUNTER — Other Ambulatory Visit: Payer: Self-pay

## 2021-06-28 ENCOUNTER — Inpatient Hospital Stay: Payer: Medicare PPO

## 2021-06-28 ENCOUNTER — Inpatient Hospital Stay: Payer: Medicare PPO | Admitting: Internal Medicine

## 2021-06-28 ENCOUNTER — Inpatient Hospital Stay: Payer: Medicare PPO | Attending: Internal Medicine

## 2021-06-28 VITALS — BP 122/83 | HR 80 | Temp 97.8°F | Resp 17 | Wt 210.1 lb

## 2021-06-28 DIAGNOSIS — I129 Hypertensive chronic kidney disease with stage 1 through stage 4 chronic kidney disease, or unspecified chronic kidney disease: Secondary | ICD-10-CM | POA: Insufficient documentation

## 2021-06-28 DIAGNOSIS — R0609 Other forms of dyspnea: Secondary | ICD-10-CM | POA: Diagnosis not present

## 2021-06-28 DIAGNOSIS — R5382 Chronic fatigue, unspecified: Secondary | ICD-10-CM

## 2021-06-28 DIAGNOSIS — C3491 Malignant neoplasm of unspecified part of right bronchus or lung: Secondary | ICD-10-CM | POA: Diagnosis not present

## 2021-06-28 DIAGNOSIS — J439 Emphysema, unspecified: Secondary | ICD-10-CM | POA: Insufficient documentation

## 2021-06-28 DIAGNOSIS — C3411 Malignant neoplasm of upper lobe, right bronchus or lung: Secondary | ICD-10-CM | POA: Diagnosis not present

## 2021-06-28 DIAGNOSIS — I700 Atherosclerosis of aorta: Secondary | ICD-10-CM | POA: Insufficient documentation

## 2021-06-28 DIAGNOSIS — J479 Bronchiectasis, uncomplicated: Secondary | ICD-10-CM | POA: Diagnosis not present

## 2021-06-28 DIAGNOSIS — Z79899 Other long term (current) drug therapy: Secondary | ICD-10-CM | POA: Diagnosis not present

## 2021-06-28 DIAGNOSIS — R590 Localized enlarged lymph nodes: Secondary | ICD-10-CM | POA: Diagnosis not present

## 2021-06-28 DIAGNOSIS — C61 Malignant neoplasm of prostate: Secondary | ICD-10-CM | POA: Insufficient documentation

## 2021-06-28 DIAGNOSIS — N189 Chronic kidney disease, unspecified: Secondary | ICD-10-CM | POA: Diagnosis not present

## 2021-06-28 DIAGNOSIS — Z5112 Encounter for antineoplastic immunotherapy: Secondary | ICD-10-CM

## 2021-06-28 DIAGNOSIS — Z9221 Personal history of antineoplastic chemotherapy: Secondary | ICD-10-CM | POA: Diagnosis not present

## 2021-06-28 DIAGNOSIS — Z923 Personal history of irradiation: Secondary | ICD-10-CM | POA: Insufficient documentation

## 2021-06-28 LAB — CBC WITH DIFFERENTIAL (CANCER CENTER ONLY)
Abs Immature Granulocytes: 0.01 10*3/uL (ref 0.00–0.07)
Basophils Absolute: 0 10*3/uL (ref 0.0–0.1)
Basophils Relative: 1 %
Eosinophils Absolute: 0.3 10*3/uL (ref 0.0–0.5)
Eosinophils Relative: 9 %
HCT: 31 % — ABNORMAL LOW (ref 39.0–52.0)
Hemoglobin: 10.3 g/dL — ABNORMAL LOW (ref 13.0–17.0)
Immature Granulocytes: 0 %
Lymphocytes Relative: 24 %
Lymphs Abs: 0.8 10*3/uL (ref 0.7–4.0)
MCH: 30.7 pg (ref 26.0–34.0)
MCHC: 33.2 g/dL (ref 30.0–36.0)
MCV: 92.3 fL (ref 80.0–100.0)
Monocytes Absolute: 0.5 10*3/uL (ref 0.1–1.0)
Monocytes Relative: 14 %
Neutro Abs: 1.8 10*3/uL (ref 1.7–7.7)
Neutrophils Relative %: 52 %
Platelet Count: 251 10*3/uL (ref 150–400)
RBC: 3.36 MIL/uL — ABNORMAL LOW (ref 4.22–5.81)
RDW: 14.5 % (ref 11.5–15.5)
WBC Count: 3.5 10*3/uL — ABNORMAL LOW (ref 4.0–10.5)
nRBC: 0 % (ref 0.0–0.2)

## 2021-06-28 LAB — CMP (CANCER CENTER ONLY)
ALT: 13 U/L (ref 0–44)
AST: 16 U/L (ref 15–41)
Albumin: 4 g/dL (ref 3.5–5.0)
Alkaline Phosphatase: 80 U/L (ref 38–126)
Anion gap: 9 (ref 5–15)
BUN: 24 mg/dL — ABNORMAL HIGH (ref 8–23)
CO2: 21 mmol/L — ABNORMAL LOW (ref 22–32)
Calcium: 9.6 mg/dL (ref 8.9–10.3)
Chloride: 111 mmol/L (ref 98–111)
Creatinine: 1.73 mg/dL — ABNORMAL HIGH (ref 0.61–1.24)
GFR, Estimated: 42 mL/min — ABNORMAL LOW (ref >60)
Glucose, Bld: 85 mg/dL (ref 70–99)
Potassium: 3.9 mmol/L (ref 3.5–5.1)
Sodium: 141 mmol/L (ref 135–145)
Total Bilirubin: 0.4 mg/dL (ref 0.3–1.2)
Total Protein: 7 g/dL (ref 6.5–8.1)

## 2021-06-28 LAB — TSH: TSH: 0.675 u[IU]/mL (ref 0.350–4.500)

## 2021-06-28 MED ORDER — SODIUM CHLORIDE 0.9 % IV SOLN: Freq: Once | INTRAVENOUS | Status: AC

## 2021-06-28 MED ORDER — SODIUM CHLORIDE 0.9 % IV SOLN
1500.0000 mg | Freq: Once | INTRAVENOUS | Status: AC
  Administered 2021-06-28: 1500 mg via INTRAVENOUS
  Filled 2021-06-28: qty 30

## 2021-06-28 NOTE — Patient Instructions (Signed)
[facility]  Discharge Instructions: Thank you for choosing [facility] to provide your oncology and hematology care.   If you have a lab appointment with the [facility], please go directly to the [facility] and check in at the registration area.   Wear comfortable clothing and clothing appropriate for easy access to any Portacath or PICC line.   We strive to give you quality time with your provider. You may need to reschedule your appointment if you arrive late (15 or more minutes).  Arriving late affects you and other patients whose appointments are after yours.  Also, if you miss three or more appointments without notifying the office, you may be dismissed from the clinic at the provider's discretion.      For prescription refill requests, have your pharmacy contact our office and allow 72 hours for refills to be completed.    Today you received the following chemotherapy and/or immunotherapy agent: Imfinzi      To help prevent nausea and vomiting after your treatment, we encourage you to take your nausea medication as directed.  BELOW ARE SYMPTOMS THAT SHOULD BE REPORTED IMMEDIATELY: *FEVER GREATER THAN 100.4 F (38 C) OR HIGHER *CHILLS OR SWEATING *NAUSEA AND VOMITING THAT IS NOT CONTROLLED WITH YOUR NAUSEA MEDICATION *UNUSUAL SHORTNESS OF BREATH *UNUSUAL BRUISING OR BLEEDING *URINARY PROBLEMS (pain or burning when urinating, or frequent urination) *BOWEL PROBLEMS (unusual diarrhea, constipation, pain near the anus) TENDERNESS IN MOUTH AND THROAT WITH OR WITHOUT PRESENCE OF ULCERS (sore throat, sores in mouth, or a toothache) UNUSUAL RASH, SWELLING OR PAIN  UNUSUAL VAGINAL DISCHARGE OR ITCHING   Items with * indicate a potential emergency and should be followed up as soon as possible or go to the Emergency Department if any problems should occur.  Please show the CHEMOTHERAPY ALERT CARD or IMMUNOTHERAPY ALERT CARD at check-in to the  Emergency Department and triage nurse.  Should you have questions after your visit or need to cancel or reschedule your appointment, please contact [facility]  Dept: [phone]  and follow the prompts.  Office hours are 8:00 a.m. to 4:30 p.m. Monday - Friday. Please note that voicemails left after 4:00 p.m. may not be returned until the following business day.  We are closed weekends and major holidays. You have access to a nurse at all times for urgent questions. Please call the main number to the clinic Dept: [phone] and follow the prompts.   For any non-urgent questions, you may also contact your provider using MyChart. We now offer e-Visits for anyone 49 and older to request care online for non-urgent symptoms. For details visit [email or web address].   Also download the MyChart app! Go to the app store, search "MyChart", open the app, select [facility], and log in with your MyChart username and password.  Due to Covid, a mask is required upon entering the hospital/clinic. If you do not have a mask, one will be given to you upon arrival. For doctor visits, patients may have 1 support person aged 50 or older with them. For treatment visits, patients cannot have anyone with them due to current Covid guidelines and our immunocompromised population.

## 2021-06-28 NOTE — Progress Notes (Signed)
Per Dr. Julien Nordmann ,it is okay to treat Jason [name] today with Durvalumab and creatinine of 1.73.

## 2021-06-28 NOTE — Progress Notes (Signed)
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   PRIOR THERAPY: Concurrent chemoradiation with weekly carboplatin for AUC of 2 and paclitaxel 45 Mg/M2. Status post 7 cycles. First dose on 07/04/20 and last cycle was giving August 14, 2020 with partial response.    CURRENT THERAPY: Consolidation treatment with immunotherapy with Imfinzi 1500 Mg IV every 4 weeks starting September 13, 2020.  Status post 10 cycles.  INTERVAL HISTORY: Jason [name]. 71 y.o. male returns to the clinic today for follow-up visit.  The patient is feeling fine today with no concerning complaints.  Has some shortness of breath with exertion but he gained around 7 pounds since his last visit.  He is very good.  He will try to start exercising at regular basis.  He denied having any chest pain, cough or hemoptysis.  He denied having any nausea, vomiting, diarrhea or constipation.  He has no headache or visual changes.  He is here today for evaluation before starting cycle #11.   MEDICAL HISTORY: Past Medical History:  Diagnosis Date   Arthritis    back   Hypertension    Prostate cancer ([place])    Tobacco use     ALLERGIES:  has No Known Allergies.  MEDICATIONS:  Current Outpatient Medications  Medication Sig Dispense Refill   acetaminophen (TYLENOL) 325 MG tablet Take 650 mg by mouth every 6 (six) hours as needed.     atorvastatin (LIPITOR) 10 MG tablet Take 10 mg by mouth daily.     azelastine (ASTELIN) 0.1 % nasal spray Place 1 spray into both nostrils 2 (two) times daily as needed  for rhinitis. Use in each nostril as directed     fluticasone (FLONASE) 50 MCG/ACT nasal spray Place 2 sprays into both nostrils daily.     levocetirizine (XYZAL) 5 MG tablet Take 5 mg by mouth every evening.     montelukast (SINGULAIR) 10 MG tablet Take 10 mg by mouth at bedtime.     valsartan-hydrochlorothiazide (DIOVAN-HCT) 320-25 MG tablet Take 1 tablet by mouth daily.     venlafaxine (EFFEXOR) 25 MG tablet 1 tablet with food     No current facility-administered medications for this visit.    SURGICAL HISTORY:  Past Surgical History:  Procedure Laterality Date   BACK SURGERY     BRONCHIAL BIOPSY  06/06/2020   Procedure: BRONCHIAL BIOPSIES;  Surgeon: Garner Nash, DO;  Location: [facility];  Service: Pulmonary;;   BRONCHIAL BRUSHINGS  06/06/2020   Procedure: BRONCHIAL BRUSHINGS;  Surgeon: Garner Nash, DO;  Location: [facility];  Service: Pulmonary;;   BRONCHIAL NEEDLE ASPIRATION BIOPSY  06/06/2020   Procedure: BRONCHIAL NEEDLE ASPIRATION BIOPSIES;  Surgeon: Garner Nash, DO;  Location: [facility];  Service: Pulmonary;;   BRONCHIAL WASHINGS  06/06/2020   Procedure: BRONCHIAL WASHINGS;  Surgeon: Garner Nash, DO;  Location: [facility];  Service: Pulmonary;;   COLONOSCOPY     OTHER SURGICAL HISTORY     fluid drawn off his testicle   PROSTATE BIOPSY     ROTATOR CUFF REPAIR Right    VIDEO  BRONCHOSCOPY WITH ENDOBRONCHIAL NAVIGATION Right 06/06/2020   Procedure: VIDEO BRONCHOSCOPY WITH ENDOBRONCHIAL NAVIGATION;  Surgeon: Garner Nash, DO;  Location: [facility];  Service: Pulmonary;  Laterality: Right;   VIDEO BRONCHOSCOPY WITH ENDOBRONCHIAL ULTRASOUND Bilateral 06/06/2020   Procedure: VIDEO BRONCHOSCOPY WITH ENDOBRONCHIAL ULTRASOUND;  Surgeon: Garner Nash, DO;  Location: [facility];  Service: Pulmonary;  Laterality: Bilateral;   WRIST SURGERY      REVIEW OF SYSTEMS:  A comprehensive review of systems was negative except for: Respiratory: positive for  dyspnea on exertion   PHYSICAL EXAMINATION: General appearance: alert, cooperative, and no distress Head: Normocephalic, without obvious abnormality, atraumatic Neck: no adenopathy, no JVD, supple, symmetrical, trachea midline, and thyroid not enlarged, symmetric, no tenderness/mass/nodules Lymph nodes: Cervical, supraclavicular, and axillary nodes normal. Resp: clear to auscultation bilaterally Back: symmetric, no curvature. ROM normal. No CVA tenderness. Cardio: regular rate and rhythm, S1, S2 normal, no murmur, click, rub or gallop GI: soft, non-tender; bowel sounds normal; no masses,  no organomegaly Extremities: extremities normal, atraumatic, no cyanosis or edema  ECOG PERFORMANCE STATUS: 1 - Symptomatic but completely ambulatory  Blood pressure 122/83, pulse 80, temperature 97.8 F (36.6 C), temperature source Tympanic, resp. rate 17, weight 210 lb 2 oz (95.3 kg), SpO2 91 %.  LABORATORY DATA: Lab Results  Component Value Date   WBC 3.5 (L) 06/28/2021   HGB 10.3 (L) 06/28/2021   HCT 31.0 (L) 06/28/2021   MCV 92.3 06/28/2021   PLT 251 06/28/2021      Chemistry      Component Value Date/Time   NA 138 05/31/2021 1048   K 4.1 05/31/2021 1048   CL 110 05/31/2021 1048   CO2 21 (L) 05/31/2021 1048   BUN 39 (H) 05/31/2021 1048   CREATININE 1.72 (H) 05/31/2021 1048      Component Value Date/Time   CALCIUM 9.5 05/31/2021 1048   ALKPHOS 92 05/31/2021 1048   AST 15 05/31/2021 1048   ALT 14 05/31/2021 1048   BILITOT 0.3 05/31/2021 1048       RADIOGRAPHIC STUDIES: CT Chest Wo Contrast  Result Date: 05/29/2021 CLINICAL DATA:  Non-small cell lung cancer staging; * Tracking Code: BO * EXAM: CT CHEST WITHOUT CONTRAST TECHNIQUE: Multidetector CT imaging of the chest was performed following the standard protocol without IV contrast. RADIATION DOSE REDUCTION: This exam was performed according to the departmental dose-optimization program which includes automated exposure control,  adjustment of the mA and/or kV according to patient size and/or use of iterative reconstruction technique. COMPARISON:  Chest CT without dated March 05, 2021 FINDINGS: Cardiovascular: Normal heart size. No pericardial effusion. Normal caliber thoracic aorta with atherosclerotic disease. Mediastinum/Nodes: Esophagus and thyroid are unremarkable. No pathologically enlarged lymph nodes seen in the chest. Lungs/Pleura: Central airways are patent. Mild paraseptal emphysema. Right upper lobe pulmonary nodule measures 10 x 8 mm on series 5, image 22, previously 14 x 11 mm, surrounding ground-glass and reticular opacities, likely post radiation change. Lower lung predominant peribronchovascular and reticular opacities with associated traction bronchiectasis, unchanged when compared with prior exam. No new or enlarging pulmonary nodules. Upper Abdomen: Stable left adrenal gland nodule, likely benign adenoma. No acute abnormality. Musculoskeletal: No chest wall mass or suspicious bone lesions identified. IMPRESSION: 1. Decreased size of right upper lobe pulmonary nodule with similar surrounding post radiation change. No evidence of recurrent or metastatic disease. 2. Aortic Atherosclerosis (ICD10-I70.0) and Emphysema (ICD10-J43.9). Electronically Signed   By: Yetta Glassman M.D.   On: 05/29/2021 16:44    ASSESSMENT  AND PLAN: This is a very pleasant 71 years old African-American male recently diagnosed with a stage IIIB (T1c, N3, M0) non-small cell lung cancer, squamous cell carcinoma presented with right upper lobe lung nodule in addition to subcarinal and suspicious AP window lymphadenopathy diagnosed in May 2022.  The patient also has a history of prostate adenocarcinoma diagnosed in April 2022. He underwent a course of concurrent chemoradiation with weekly carboplatin for AUC of 2 and paclitaxel 45 Mg/M2 status post 7 cycles.  The patient tolerated this course of concurrent chemoradiation fairly well with no  concerning adverse effects. The patient is currently on consolidation treatment with immunotherapy with Imfinzi 1500 Mg IV every 4 weeks status post 10 cycles. The patient has been tolerating this treatment fairly well with no concerning adverse effects. I recommended for him to proceed with cycle #11 today as planned. I will see him back for follow-up visit in 4 weeks for evaluation before starting cycle #12. The patient was advised to call immediately if he has any other concerning symptoms in the interval. The patient voices understanding of current disease status and treatment options and is in agreement with the current care plan.  All questions were answered. The patient knows to call the clinic with any problems, questions or concerns. We can certainly see the patient much sooner if necessary.  Disclaimer: This note was dictated with voice recognition software. Similar sounding words can inadvertently be transcribed and may not be corrected upon review.

## 2021-07-24 NOTE — Progress Notes (Signed)
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  PRIOR THERAPY:Concurrent chemoradiation with weekly carboplatin for AUC of 2 and paclitaxel 45 Mg/M2. Status post 7 cycles. First dose on 07/04/20 and last cycle was giving August 14, 2020 with partial response.   CURRENT THERAPY: Consolidation treatment with immunotherapy with Imfinzi 1500 Mg IV every 4 weeks starting September 13, 2020. Status post 11 cycles.   INTERVAL HISTORY: Jason [name]. 71 y.o. male returns to the clinic for a follow up visit. The patient is feeling well today without any concerning complaints. He is tolerating his treatment well. Denies any fever, chills, or weight loss. He sometimes gets hot flashes after he has his injection for his prostate cancer. He recently had a follow up with his nephrologist for his CKD per patient report. Denies any chest pain, shortness of breath, cough, or hemoptysis. Denies any nausea, vomiting, diarrhea, or constipation. Denies any headache or visual changes. He mentions he has a history of vertigo and had an episode a few weeks ago. However, he got his medication for this and his symptoms resolved after 2 days. He has not had any recurrent episodes since. Denies any rashes or skin changes. The patient is here today for evaluation prior to starting cycle #12    MEDICAL HISTORY: Past Medical History:  Diagnosis Date   Arthritis    back   Hypertension    Prostate cancer ([facility])    Tobacco use     ALLERGIES:  has No Known Allergies.  MEDICATIONS:  Current Outpatient Medications  Medication Sig Dispense Refill    acetaminophen (TYLENOL) 325 MG tablet Take 650 mg by mouth every 6 (six) hours as needed.     atorvastatin (LIPITOR) 10 MG tablet Take 10 mg by mouth daily.     azelastine (ASTELIN) 0.1 % nasal spray Place 1 spray into both nostrils 2 (two) times daily as needed for rhinitis. Use in each nostril as directed     fluticasone (FLONASE) 50 MCG/ACT nasal spray Place 2 sprays into both nostrils daily.     levocetirizine (XYZAL) 5 MG tablet Take 5 mg by mouth every evening.     montelukast (SINGULAIR) 10 MG tablet Take 10 mg by mouth at bedtime.     valsartan-hydrochlorothiazide (DIOVAN-HCT) 320-25 MG tablet Take 1 tablet by mouth daily.     venlafaxine (EFFEXOR) 25 MG tablet 1 tablet with food     No current facility-administered medications for this visit.    SURGICAL HISTORY:  Past Surgical History:  Procedure Laterality Date   BACK SURGERY     BRONCHIAL BIOPSY  06/06/2020   Procedure: BRONCHIAL BIOPSIES;  Surgeon: Garner Nash, DO;  Location: [facility];  Service: Pulmonary;;   BRONCHIAL BRUSHINGS  06/06/2020   Procedure: BRONCHIAL BRUSHINGS;  Surgeon: Garner Nash, DO;  Location: [facility];  Service: Pulmonary;;   BRONCHIAL NEEDLE ASPIRATION BIOPSY  06/06/2020   Procedure: BRONCHIAL NEEDLE ASPIRATION BIOPSIES;  Surgeon: Garner Nash, DO;  Location: [facility];  Service: Pulmonary;;   BRONCHIAL WASHINGS  06/06/2020   Procedure: BRONCHIAL WASHINGS;  Surgeon: Garner Nash, DO;  Location: [facility];  Service:  Pulmonary;;   COLONOSCOPY     OTHER SURGICAL HISTORY     fluid drawn off his testicle   PROSTATE BIOPSY     ROTATOR CUFF REPAIR Right    VIDEO BRONCHOSCOPY WITH ENDOBRONCHIAL NAVIGATION Right 06/06/2020   Procedure: VIDEO BRONCHOSCOPY WITH ENDOBRONCHIAL NAVIGATION;  Surgeon: Garner Nash, DO;  Location: [facility];  Service: Pulmonary;  Laterality: Right;   VIDEO BRONCHOSCOPY WITH ENDOBRONCHIAL ULTRASOUND Bilateral 06/06/2020   Procedure: VIDEO BRONCHOSCOPY  WITH ENDOBRONCHIAL ULTRASOUND;  Surgeon: Garner Nash, DO;  Location: [facility];  Service: Pulmonary;  Laterality: Bilateral;   WRIST SURGERY      REVIEW OF SYSTEMS:   Review of Systems  Constitutional: Negative for appetite change, chills, fatigue, fever and unexpected weight change.  HENT:   Negative for mouth sores, nosebleeds, sore throat and trouble swallowing.   Eyes: Negative for eye problems and icterus.  Respiratory: Negative for cough, hemoptysis, shortness of breath and wheezing.   Cardiovascular: Negative for chest pain and leg swelling.  Gastrointestinal: Negative for abdominal pain, constipation, diarrhea, nausea and vomiting.  Genitourinary: Negative for bladder incontinence, difficulty urinating, dysuria, frequency and hematuria.   Musculoskeletal: Negative for back pain, gait problem, neck pain and neck stiffness.  Skin: Negative for itching and rash.  Neurological: Negative for dizziness, extremity weakness, gait problem, headaches, light-headedness and seizures.  Hematological: Negative for adenopathy. Does not bruise/bleed easily.  Psychiatric/Behavioral: Negative for confusion, depression and sleep disturbance. The patient is not nervous/anxious.     PHYSICAL EXAMINATION:  Blood pressure 120/82, pulse 84, temperature 98 F (36.7 C), temperature source Oral, resp. rate 16, weight 207 lb 1.6 oz (93.9 kg), SpO2 98 %.  ECOG PERFORMANCE STATUS: 1  Physical Exam  Constitutional: Oriented to person, place, and time and well-developed, well-nourished, and in no distress.  HENT:  Head: Normocephalic and atraumatic.  Mouth/Throat: Oropharynx is clear and moist. No oropharyngeal exudate.  Eyes: Conjunctivae are normal. Right eye exhibits no discharge. Left eye exhibits no discharge. No scleral icterus.  Neck: Normal range of motion. Neck supple.  Cardiovascular: Normal rate, regular rhythm, normal heart sounds and intact distal pulses.   Pulmonary/Chest: Effort  normal and breath sounds normal. No respiratory distress. No wheezes. No rales.  Abdominal: Soft. Bowel sounds are normal. Exhibits no distension and no mass. There is no tenderness.  Musculoskeletal: Normal range of motion. Exhibits no edema.  Lymphadenopathy:    No cervical adenopathy.  Neurological: Alert and oriented to person, place, and time. Exhibits normal muscle tone. Gait normal. Coordination normal.  Skin: Skin is warm and dry. No rash noted. Not diaphoretic. No erythema. No pallor.  Psychiatric: Mood, memory and judgment normal.  Vitals reviewed.  LABORATORY DATA: Lab Results  Component Value Date   WBC 3.6 (L) 07/26/2021   HGB 10.9 (L) 07/26/2021   HCT 33.3 (L) 07/26/2021   MCV 92.2 07/26/2021   PLT 249 07/26/2021      Chemistry      Component Value Date/Time   NA 139 07/26/2021 0955   K 4.1 07/26/2021 0955   CL 109 07/26/2021 0955   CO2 20 (L) 07/26/2021 0955   BUN 36 (H) 07/26/2021 0955   CREATININE 1.97 (H) 07/26/2021 0955      Component Value Date/Time   CALCIUM 9.5 07/26/2021 0955   ALKPHOS 97 07/26/2021 0955   AST 15 07/26/2021 0955   ALT 10 07/26/2021 0955   BILITOT 0.3 07/26/2021 0955       RADIOGRAPHIC STUDIES:  No results  found.   ASSESSMENT/PLAN:  This is a very pleasant 71 year old African-American male  diagnosed with stage IIIa/C (T1c, N2-N3, M0) non-small cell lung cancer, squamous cell carcinoma.  He presented with a right upper lobe lung nodule in addition to subcarinal lymphadenopathy and suspicious AP window lymph node involvement.  He was diagnosed in May 2022.  Also diagnosed with prostate adenocarcinoma with a Gleason score of 9.  He was diagnosed in April 2022.   The patient completed concurrent chemoradiation with carboplatin for an AUC of 2 and paclitaxel 45 mg per metered squared.  He is status post 7 cycles and tolerated it well.    The patient is currently undergoing consolidation immunotherapy with Imfinzi 1500 mg IV every 4  weeks.  He is status post 11 cycles and tolerated it well.   Labs were reviewed.  He has CKD. He mentions to me today he recently saw his nephrologist. Ok to treat with creatinine of 1.97 today. Recommend that he proceed with cycle #12 today scheduled.    We will see him back for a follow up visit in 4 weeks for evaluation before starting cycle #13. I discussed with the patient we will talk about ordering his next CT scan at his next appointment.   The patient was advised to call immediately if he has any concerning symptoms in the interval. The patient voices understanding of current disease status and treatment options and is in agreement with the current care plan. All questions were answered. The patient knows to call the clinic with any problems, questions or concerns. We can certainly see the patient much sooner if necessary    No orders of the defined types were placed in this encounter.    The total time spent in the appointment was 20-29 minutes  Ariane Ditullio L Saidee Geremia, PA-C 07/26/21

## 2021-07-26 ENCOUNTER — Inpatient Hospital Stay: Payer: Medicare PPO | Admitting: Physician Assistant

## 2021-07-26 ENCOUNTER — Inpatient Hospital Stay: Payer: Medicare PPO

## 2021-07-26 ENCOUNTER — Other Ambulatory Visit: Payer: Self-pay

## 2021-07-26 VITALS — BP 120/82 | HR 84 | Temp 98.0°F | Resp 16 | Wt 207.1 lb

## 2021-07-26 DIAGNOSIS — C3411 Malignant neoplasm of upper lobe, right bronchus or lung: Secondary | ICD-10-CM | POA: Diagnosis not present

## 2021-07-26 DIAGNOSIS — R590 Localized enlarged lymph nodes: Secondary | ICD-10-CM | POA: Diagnosis not present

## 2021-07-26 DIAGNOSIS — N183 Chronic kidney disease, stage 3 unspecified: Secondary | ICD-10-CM

## 2021-07-26 DIAGNOSIS — J439 Emphysema, unspecified: Secondary | ICD-10-CM | POA: Diagnosis not present

## 2021-07-26 DIAGNOSIS — C3491 Malignant neoplasm of unspecified part of right bronchus or lung: Secondary | ICD-10-CM

## 2021-07-26 DIAGNOSIS — Z5112 Encounter for antineoplastic immunotherapy: Secondary | ICD-10-CM | POA: Diagnosis not present

## 2021-07-26 DIAGNOSIS — R5382 Chronic fatigue, unspecified: Secondary | ICD-10-CM

## 2021-07-26 DIAGNOSIS — I129 Hypertensive chronic kidney disease with stage 1 through stage 4 chronic kidney disease, or unspecified chronic kidney disease: Secondary | ICD-10-CM | POA: Diagnosis not present

## 2021-07-26 DIAGNOSIS — I700 Atherosclerosis of aorta: Secondary | ICD-10-CM | POA: Diagnosis not present

## 2021-07-26 DIAGNOSIS — J479 Bronchiectasis, uncomplicated: Secondary | ICD-10-CM | POA: Diagnosis not present

## 2021-07-26 DIAGNOSIS — N189 Chronic kidney disease, unspecified: Secondary | ICD-10-CM | POA: Diagnosis not present

## 2021-07-26 DIAGNOSIS — R0609 Other forms of dyspnea: Secondary | ICD-10-CM | POA: Diagnosis not present

## 2021-07-26 DIAGNOSIS — C61 Malignant neoplasm of prostate: Secondary | ICD-10-CM | POA: Diagnosis not present

## 2021-07-26 LAB — CBC WITH DIFFERENTIAL (CANCER CENTER ONLY)
Abs Immature Granulocytes: 0.02 10*3/uL (ref 0.00–0.07)
Basophils Absolute: 0 10*3/uL (ref 0.0–0.1)
Basophils Relative: 1 %
Eosinophils Absolute: 0.2 10*3/uL (ref 0.0–0.5)
Eosinophils Relative: 6 %
HCT: 33.3 % — ABNORMAL LOW (ref 39.0–52.0)
Hemoglobin: 10.9 g/dL — ABNORMAL LOW (ref 13.0–17.0)
Immature Granulocytes: 1 %
Lymphocytes Relative: 29 %
Lymphs Abs: 1 10*3/uL (ref 0.7–4.0)
MCH: 30.2 pg (ref 26.0–34.0)
MCHC: 32.7 g/dL (ref 30.0–36.0)
MCV: 92.2 fL (ref 80.0–100.0)
Monocytes Absolute: 0.6 10*3/uL (ref 0.1–1.0)
Monocytes Relative: 16 %
Neutro Abs: 1.7 10*3/uL (ref 1.7–7.7)
Neutrophils Relative %: 47 %
Platelet Count: 249 10*3/uL (ref 150–400)
RBC: 3.61 MIL/uL — ABNORMAL LOW (ref 4.22–5.81)
RDW: 14.7 % (ref 11.5–15.5)
WBC Count: 3.6 10*3/uL — ABNORMAL LOW (ref 4.0–10.5)
nRBC: 0 % (ref 0.0–0.2)

## 2021-07-26 LAB — CMP (CANCER CENTER ONLY)
ALT: 10 U/L (ref 0–44)
AST: 15 U/L (ref 15–41)
Albumin: 4.1 g/dL (ref 3.5–5.0)
Alkaline Phosphatase: 97 U/L (ref 38–126)
Anion gap: 10 (ref 5–15)
BUN: 36 mg/dL — ABNORMAL HIGH (ref 8–23)
CO2: 20 mmol/L — ABNORMAL LOW (ref 22–32)
Calcium: 9.5 mg/dL (ref 8.9–10.3)
Chloride: 109 mmol/L (ref 98–111)
Creatinine: 1.97 mg/dL — ABNORMAL HIGH (ref 0.61–1.24)
GFR, Estimated: 35 mL/min — ABNORMAL LOW (ref >60)
Glucose, Bld: 114 mg/dL — ABNORMAL HIGH (ref 70–99)
Potassium: 4.1 mmol/L (ref 3.5–5.1)
Sodium: 139 mmol/L (ref 135–145)
Total Bilirubin: 0.3 mg/dL (ref 0.3–1.2)
Total Protein: 7.1 g/dL (ref 6.5–8.1)

## 2021-07-26 LAB — TSH: TSH: 0.574 u[IU]/mL (ref 0.350–4.500)

## 2021-07-26 MED ORDER — SODIUM CHLORIDE 0.9 % IV SOLN
1500.0000 mg | Freq: Once | INTRAVENOUS | Status: AC
  Administered 2021-07-26: 1500 mg via INTRAVENOUS
  Filled 2021-07-26: qty 30

## 2021-07-26 MED ORDER — SODIUM CHLORIDE 0.9 % IV SOLN: Freq: Once | INTRAVENOUS | Status: AC

## 2021-07-26 NOTE — Patient Instructions (Signed)
[facility]  Discharge Instructions: Thank you for choosing [facility] to provide your oncology and hematology care.   If you have a lab appointment with the [facility], please go directly to the [facility] and check in at the registration area.   Wear comfortable clothing and clothing appropriate for easy access to any Portacath or PICC line.   We strive to give you quality time with your provider. You may need to reschedule your appointment if you arrive late (15 or more minutes).  Arriving late affects you and other patients whose appointments are after yours.  Also, if you miss three or more appointments without notifying the office, you may be dismissed from the clinic at the provider's discretion.      For prescription refill requests, have your pharmacy contact our office and allow 72 hours for refills to be completed.    Today you received the following chemotherapy and/or immunotherapy agent: Imfinzi      To help prevent nausea and vomiting after your treatment, we encourage you to take your nausea medication as directed.  BELOW ARE SYMPTOMS THAT SHOULD BE REPORTED IMMEDIATELY: *FEVER GREATER THAN 100.4 F (38 C) OR HIGHER *CHILLS OR SWEATING *NAUSEA AND VOMITING THAT IS NOT CONTROLLED WITH YOUR NAUSEA MEDICATION *UNUSUAL SHORTNESS OF BREATH *UNUSUAL BRUISING OR BLEEDING *URINARY PROBLEMS (pain or burning when urinating, or frequent urination) *BOWEL PROBLEMS (unusual diarrhea, constipation, pain near the anus) TENDERNESS IN MOUTH AND THROAT WITH OR WITHOUT PRESENCE OF ULCERS (sore throat, sores in mouth, or a toothache) UNUSUAL RASH, SWELLING OR PAIN  UNUSUAL VAGINAL DISCHARGE OR ITCHING   Items with * indicate a potential emergency and should be followed up as soon as possible or go to the Emergency Department if any problems should occur.  Please show the CHEMOTHERAPY ALERT CARD or IMMUNOTHERAPY ALERT CARD at check-in to the  Emergency Department and triage nurse.  Should you have questions after your visit or need to cancel or reschedule your appointment, please contact [facility]  Dept: [phone]  and follow the prompts.  Office hours are 8:00 a.m. to 4:30 p.m. Monday - Friday. Please note that voicemails left after 4:00 p.m. may not be returned until the following business day.  We are closed weekends and major holidays. You have access to a nurse at all times for urgent questions. Please call the main number to the clinic Dept: [phone] and follow the prompts.   For any non-urgent questions, you may also contact your provider using MyChart. We now offer e-Visits for anyone 79 and older to request care online for non-urgent symptoms. For details visit [email or web address].   Also download the MyChart app! Go to the app store, search "MyChart", open the app, select [facility], and log in with your MyChart username and password.  Due to Covid, a mask is required upon entering the hospital/clinic. If you do not have a mask, one will be given to you upon arrival. For doctor visits, patients may have 1 support person aged 65 or older with them. For treatment visits, patients cannot have anyone with them due to current Covid guidelines and our immunocompromised population.

## 2021-07-26 NOTE — Progress Notes (Signed)
Ok to treat with elevated serum creatinine per Cassie, PA.

## 2021-08-17 NOTE — Progress Notes (Signed)
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  PRIOR THERAPY: Concurrent chemoradiation with weekly carboplatin for AUC of 2 and paclitaxel 45 Mg/M2. Status post 7 cycles. First dose on 07/04/20 and last cycle was giving August 14, 2020 with partial response.   CURRENT THERAPY: Consolidation treatment with immunotherapy with Imfinzi 1500 Mg IV every 4 weeks starting September 13, 2020. Status post 12 cycles.   INTERVAL HISTORY: Jason [name]. 71 y.o. male returns to the clinic for a follow up visit. The patient is feeling well today without any concerning complaints. He is tolerating his treatment well. Denies any fever, chills, or weight loss. He sometimes gets hot flashes after he has his injection for his prostate cancer.  Follows closely with nephrology for his CKD.  Denies any chest pain, shortness of breath, cough, or hemoptysis. Denies any nausea, vomiting, diarrhea, or constipation. Denies any headache or visual changes. The vertigo that he reported at his last appointment has resolved several weeks ago. Denies any rashes or skin changes. The patient is here today for evaluation prior to starting cycle #13.      MEDICAL HISTORY: Past Medical History:  Diagnosis Date   Arthritis    back   Hypertension    Prostate cancer ([facility])    Tobacco use     ALLERGIES:  has No Known Allergies.  MEDICATIONS:  Current Outpatient Medications  Medication Sig Dispense Refill   acetaminophen (TYLENOL) 325 MG tablet Take 650 mg by mouth every 6 (six) hours as needed.     atorvastatin (LIPITOR) 10 MG tablet Take 10 mg by  mouth daily.     azelastine (ASTELIN) 0.1 % nasal spray Place 1 spray into both nostrils 2 (two) times daily as needed for rhinitis. Use in each nostril as directed     fluticasone (FLONASE) 50 MCG/ACT nasal spray Place 2 sprays into both nostrils daily.     levocetirizine (XYZAL) 5 MG tablet Take 5 mg by mouth every evening.     montelukast (SINGULAIR) 10 MG tablet Take 10 mg by mouth at bedtime.     valsartan-hydrochlorothiazide (DIOVAN-HCT) 320-25 MG tablet Take 1 tablet by mouth daily.     venlafaxine (EFFEXOR) 25 MG tablet 1 tablet with food     No current facility-administered medications for this visit.    SURGICAL HISTORY:  Past Surgical History:  Procedure Laterality Date   BACK SURGERY     BRONCHIAL BIOPSY  06/06/2020   Procedure: BRONCHIAL BIOPSIES;  Surgeon: Garner Nash, DO;  Location: [facility];  Service: Pulmonary;;   BRONCHIAL BRUSHINGS  06/06/2020   Procedure: BRONCHIAL BRUSHINGS;  Surgeon: Garner Nash, DO;  Location: [facility];  Service: Pulmonary;;   BRONCHIAL NEEDLE ASPIRATION BIOPSY  06/06/2020   Procedure: BRONCHIAL NEEDLE ASPIRATION BIOPSIES;  Surgeon: Garner Nash, DO;  Location: [facility];  Service: Pulmonary;;   BRONCHIAL WASHINGS  06/06/2020   Procedure: BRONCHIAL WASHINGS;  Surgeon: Garner Nash, DO;  Location: [facility];  Service: Pulmonary;;   COLONOSCOPY     OTHER SURGICAL HISTORY     fluid drawn off his testicle   PROSTATE BIOPSY  ROTATOR CUFF REPAIR Right    VIDEO BRONCHOSCOPY WITH ENDOBRONCHIAL NAVIGATION Right 06/06/2020   Procedure: VIDEO BRONCHOSCOPY WITH ENDOBRONCHIAL NAVIGATION;  Surgeon: Garner Nash, DO;  Location: [facility];  Service: Pulmonary;  Laterality: Right;   VIDEO BRONCHOSCOPY WITH ENDOBRONCHIAL ULTRASOUND Bilateral 06/06/2020   Procedure: VIDEO BRONCHOSCOPY WITH ENDOBRONCHIAL ULTRASOUND;  Surgeon: Garner Nash, DO;  Location: [facility];  Service: Pulmonary;  Laterality: Bilateral;   WRIST  SURGERY      REVIEW OF SYSTEMS:   Review of Systems  Constitutional: Negative for appetite change, chills, fatigue, fever and unexpected weight change.  HENT:   Negative for mouth sores, nosebleeds, sore throat and trouble swallowing.   Eyes: Negative for eye problems and icterus.  Respiratory: Negative for cough, hemoptysis, shortness of breath and wheezing.   Cardiovascular: Negative for chest pain and leg swelling.  Gastrointestinal: Negative for abdominal pain, constipation, diarrhea, nausea and vomiting.  Genitourinary: Negative for bladder incontinence, difficulty urinating, dysuria, frequency and hematuria.   Musculoskeletal: Negative for back pain, gait problem, neck pain and neck stiffness.  Skin: Negative for itching and rash.  Neurological: Negative for dizziness, extremity weakness, gait problem, headaches, light-headedness and seizures.  Hematological: Negative for adenopathy. Does not bruise/bleed easily.  Psychiatric/Behavioral: Negative for confusion, depression and sleep disturbance. The patient is not nervous/anxious.     PHYSICAL EXAMINATION:  Blood pressure 133/84, pulse 75, temperature 97.7 F (36.5 C), resp. rate 20, weight 212 lb 6.4 oz (96.3 kg), SpO2 95 %.  ECOG PERFORMANCE STATUS: 1 - Symptomatic but completely ambulatory  Physical Exam  Constitutional: Oriented to person, place, and time and well-developed, well-nourished, and in no distress.  HENT:  Head: Normocephalic and atraumatic.  Mouth/Throat: Oropharynx is clear and moist. No oropharyngeal exudate.  Eyes: Conjunctivae are normal. Right eye exhibits no discharge. Left eye exhibits no discharge. No scleral icterus.  Neck: Normal range of motion. Neck supple.  Cardiovascular: Normal rate, regular rhythm, normal heart sounds and intact distal pulses.   Pulmonary/Chest: Effort normal and breath sounds normal. No respiratory distress. No wheezes. No rales.  Abdominal: Soft. Bowel sounds are normal.  Exhibits no distension and no mass. There is no tenderness.  Musculoskeletal: Normal range of motion. Exhibits no edema.  Lymphadenopathy:    No cervical adenopathy.  Neurological: Alert and oriented to person, place, and time. Exhibits normal muscle tone. Gait normal. Coordination normal.  Skin: Skin is warm and dry. No rash noted. Not diaphoretic. No erythema. No pallor.  Psychiatric: Mood, memory and judgment normal.  Vitals reviewed.  LABORATORY DATA: Lab Results  Component Value Date   WBC 3.7 (L) 08/23/2021   HGB 11.3 (L) 08/23/2021   HCT 34.1 (L) 08/23/2021   MCV 91.2 08/23/2021   PLT 223 08/23/2021      Chemistry      Component Value Date/Time   NA 139 07/26/2021 0955   K 4.1 07/26/2021 0955   CL 109 07/26/2021 0955   CO2 20 (L) 07/26/2021 0955   BUN 36 (H) 07/26/2021 0955   CREATININE 1.97 (H) 07/26/2021 0955      Component Value Date/Time   CALCIUM 9.5 07/26/2021 0955   ALKPHOS 97 07/26/2021 0955   AST 15 07/26/2021 0955   ALT 10 07/26/2021 0955   BILITOT 0.3 07/26/2021 0955       RADIOGRAPHIC STUDIES:  No results found.   ASSESSMENT/PLAN:  This is a very pleasant 71 year old African-American male  diagnosed with stage IIIa/C (T1c, N2-N3, M0) non-small cell lung cancer, squamous  cell carcinoma.  He presented with a right upper lobe lung nodule in addition to subcarinal lymphadenopathy and suspicious AP window lymph node involvement.  He was diagnosed in May 2022.  Also diagnosed with prostate adenocarcinoma with a Gleason score of 9.  He was diagnosed in April 2022.   The patient completed concurrent chemoradiation with carboplatin for an AUC of 2 and paclitaxel 45 mg per metered squared.  He is status post 7 cycles and tolerated it well.    The patient is currently undergoing consolidation immunotherapy with Imfinzi 1500 mg IV every 4 weeks.  He is status post 12 cycles and tolerated it well.   Labs were reviewed.  He has CKD. He mentions to me today he  recently saw his nephrologist. Ok to treat with creatinine of 1.72. Recommend that he proceed with cycle #13 today scheduled.    I will arrange for restaging CT scan of the chest without contrast to be performed in approximately 4 weeks.  We will see the patient a few days later to review the results and to have a more detailed discussion about his current condition next steps of treatment.  As long as there is no evidence of disease progression, we will likely discuss continuing on observation with the patient.  The patient was advised to call immediately if he has any concerning symptoms in the interval. The patient voices understanding of current disease status and treatment options and is in agreement with the current care plan. All questions were answered. The patient knows to call the clinic with any problems, questions or concerns. We can certainly see the patient much sooner if necessary         Orders Placed This Encounter  Procedures   CT Chest Wo Contrast    Standing Status:   Future    Standing Expiration Date:   08/23/2022    Order Specific Question:   Preferred imaging location?    Answer:   [facility]   CBC with Differential ([facility] Only)    Standing Status:   Future    Standing Expiration Date:   08/24/2022   CMP ([facility] only)    Standing Status:   Future    Standing Expiration Date:   08/24/2022     The total time spent in the appointment was 20-29 minutes.   Armanda Forand L Chantele Corado, PA-C 08/23/21

## 2021-08-20 ENCOUNTER — Other Ambulatory Visit: Payer: Self-pay

## 2021-08-23 ENCOUNTER — Inpatient Hospital Stay (HOSPITAL_BASED_OUTPATIENT_CLINIC_OR_DEPARTMENT_OTHER): Payer: Medicare PPO | Admitting: Physician Assistant

## 2021-08-23 ENCOUNTER — Inpatient Hospital Stay: Payer: Medicare PPO

## 2021-08-23 ENCOUNTER — Inpatient Hospital Stay: Payer: Medicare PPO | Attending: Internal Medicine

## 2021-08-23 ENCOUNTER — Encounter: Payer: Self-pay | Admitting: Physician Assistant

## 2021-08-23 ENCOUNTER — Other Ambulatory Visit: Payer: Self-pay

## 2021-08-23 VITALS — BP 133/84 | HR 75 | Temp 97.7°F | Resp 20 | Wt 212.4 lb

## 2021-08-23 DIAGNOSIS — I129 Hypertensive chronic kidney disease with stage 1 through stage 4 chronic kidney disease, or unspecified chronic kidney disease: Secondary | ICD-10-CM | POA: Diagnosis not present

## 2021-08-23 DIAGNOSIS — Z79899 Other long term (current) drug therapy: Secondary | ICD-10-CM | POA: Insufficient documentation

## 2021-08-23 DIAGNOSIS — Z923 Personal history of irradiation: Secondary | ICD-10-CM | POA: Diagnosis not present

## 2021-08-23 DIAGNOSIS — R590 Localized enlarged lymph nodes: Secondary | ICD-10-CM | POA: Insufficient documentation

## 2021-08-23 DIAGNOSIS — N183 Chronic kidney disease, stage 3 unspecified: Secondary | ICD-10-CM | POA: Insufficient documentation

## 2021-08-23 DIAGNOSIS — R232 Flushing: Secondary | ICD-10-CM | POA: Diagnosis not present

## 2021-08-23 DIAGNOSIS — C3491 Malignant neoplasm of unspecified part of right bronchus or lung: Secondary | ICD-10-CM

## 2021-08-23 DIAGNOSIS — Z5112 Encounter for antineoplastic immunotherapy: Secondary | ICD-10-CM

## 2021-08-23 DIAGNOSIS — C61 Malignant neoplasm of prostate: Secondary | ICD-10-CM | POA: Diagnosis not present

## 2021-08-23 DIAGNOSIS — R5382 Chronic fatigue, unspecified: Secondary | ICD-10-CM

## 2021-08-23 DIAGNOSIS — C3411 Malignant neoplasm of upper lobe, right bronchus or lung: Secondary | ICD-10-CM | POA: Insufficient documentation

## 2021-08-23 LAB — CMP (CANCER CENTER ONLY)
ALT: 10 U/L (ref 0–44)
AST: 14 U/L — ABNORMAL LOW (ref 15–41)
Albumin: 4.1 g/dL (ref 3.5–5.0)
Alkaline Phosphatase: 92 U/L (ref 38–126)
Anion gap: 8 (ref 5–15)
BUN: 30 mg/dL — ABNORMAL HIGH (ref 8–23)
CO2: 23 mmol/L (ref 22–32)
Calcium: 9.3 mg/dL (ref 8.9–10.3)
Chloride: 106 mmol/L (ref 98–111)
Creatinine: 1.72 mg/dL — ABNORMAL HIGH (ref 0.61–1.24)
GFR, Estimated: 42 mL/min — ABNORMAL LOW (ref >60)
Glucose, Bld: 96 mg/dL (ref 70–99)
Potassium: 4.2 mmol/L (ref 3.5–5.1)
Sodium: 137 mmol/L (ref 135–145)
Total Bilirubin: 0.5 mg/dL (ref 0.3–1.2)
Total Protein: 7.3 g/dL (ref 6.5–8.1)

## 2021-08-23 LAB — CBC WITH DIFFERENTIAL (CANCER CENTER ONLY)
Abs Immature Granulocytes: 0.02 10*3/uL (ref 0.00–0.07)
Basophils Absolute: 0 10*3/uL (ref 0.0–0.1)
Basophils Relative: 1 %
Eosinophils Absolute: 0.3 10*3/uL (ref 0.0–0.5)
Eosinophils Relative: 9 %
HCT: 34.1 % — ABNORMAL LOW (ref 39.0–52.0)
Hemoglobin: 11.3 g/dL — ABNORMAL LOW (ref 13.0–17.0)
Immature Granulocytes: 1 %
Lymphocytes Relative: 22 %
Lymphs Abs: 0.8 10*3/uL (ref 0.7–4.0)
MCH: 30.2 pg (ref 26.0–34.0)
MCHC: 33.1 g/dL (ref 30.0–36.0)
MCV: 91.2 fL (ref 80.0–100.0)
Monocytes Absolute: 0.6 10*3/uL (ref 0.1–1.0)
Monocytes Relative: 16 %
Neutro Abs: 1.9 10*3/uL (ref 1.7–7.7)
Neutrophils Relative %: 51 %
Platelet Count: 223 10*3/uL (ref 150–400)
RBC: 3.74 MIL/uL — ABNORMAL LOW (ref 4.22–5.81)
RDW: 14.4 % (ref 11.5–15.5)
WBC Count: 3.7 10*3/uL — ABNORMAL LOW (ref 4.0–10.5)
nRBC: 0 % (ref 0.0–0.2)

## 2021-08-23 LAB — TSH: TSH: 0.745 u[IU]/mL (ref 0.350–4.500)

## 2021-08-23 MED ORDER — SODIUM CHLORIDE 0.9 % IV SOLN
1500.0000 mg | Freq: Once | INTRAVENOUS | Status: AC
  Administered 2021-08-23: 1500 mg via INTRAVENOUS
  Filled 2021-08-23: qty 30

## 2021-08-23 MED ORDER — SODIUM CHLORIDE 0.9 % IV SOLN: Freq: Once | INTRAVENOUS | Status: AC

## 2021-08-23 NOTE — Progress Notes (Signed)
Ok to treat with elevated S cr of 1.73 per provider

## 2021-08-24 ENCOUNTER — Other Ambulatory Visit: Payer: Self-pay

## 2021-09-17 ENCOUNTER — Telehealth: Payer: Self-pay | Admitting: Physician Assistant

## 2021-09-17 NOTE — Telephone Encounter (Signed)
I am scheduled to see the patient next week on 09/24/2021.  He is supposed have a restaging CT scan which has not been scheduled at this time.  I called the patient and gave him instructions to call radiology scheduling to get his scan performed this week so we can review the results at his appointment next week.  He verbalized understanding with the instructions.

## 2021-09-17 NOTE — Progress Notes (Signed)
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  PRIOR THERAPY: 1) Concurrent chemoradiation with weekly carboplatin for AUC of 2 and paclitaxel 45 Mg/M2. Status post 7 cycles. First dose on 07/04/20 and last cycle was giving August 14, 2020 with partial response.  2) Consolidation treatment with immunotherapy with Imfinzi 1500 Mg IV every 4 weeks. Last dose 08/23/21. Status post 13 cycles.  CURRENT THERAPY: Observation   INTERVAL HISTORY: Jason [name]. 71 y.o. male returns to the clinic today for a follow-up visit.  The patient is feeling fairly well today without any concerning complaints.  Last month, the patient completed his last cycle of consolidation immunotherapy.  He had been tolerating treatment well without any adverse side effects.  Today, he denies any fever, chills, or night sweats.  He sometimes gets hot flashes related to his injection for his prostate cancer.  He follows closely with nephrology for his chronic kidney disease. He mentions he is supposed to see them for a follow up in November 2023. Denies any chest pain, shortness of breath, cough, or hemoptysis.  Denies any nausea, vomiting, diarrhea, or constipation. Denies any headache or visual changes.  The patient denies any rashes or skin changes.  The patient recently had a restaging CT scan performed.  He is here today for evaluation and to review his scan results.    MEDICAL HISTORY: Past Medical History:  Diagnosis Date   Arthritis    back   Hypertension    Prostate cancer ([place])    Tobacco use     ALLERGIES:  has No Known  Allergies.  MEDICATIONS:  Current Outpatient Medications  Medication Sig Dispense Refill   acetaminophen (TYLENOL) 325 MG tablet Take 650 mg by mouth every 6 (six) hours as needed.     atorvastatin (LIPITOR) 10 MG tablet Take 10 mg by mouth daily.     azelastine (ASTELIN) 0.1 % nasal spray Place 1 spray into both nostrils 2 (two) times daily as needed for rhinitis. Use in each nostril as directed     fluticasone (FLONASE) 50 MCG/ACT nasal spray Place 2 sprays into both nostrils daily.     levocetirizine (XYZAL) 5 MG tablet Take 5 mg by mouth every evening.     montelukast (SINGULAIR) 10 MG tablet Take 10 mg by mouth at bedtime.     valsartan-hydrochlorothiazide (DIOVAN-HCT) 320-25 MG tablet Take 1 tablet by mouth daily.     venlafaxine (EFFEXOR) 25 MG tablet 1 tablet with food     No current facility-administered medications for this visit.    SURGICAL HISTORY:  Past Surgical History:  Procedure Laterality Date   BACK SURGERY     BRONCHIAL BIOPSY  06/06/2020   Procedure: BRONCHIAL BIOPSIES;  Surgeon: Jason Nash, DO;  Location: [facility];  Service: Pulmonary;;   BRONCHIAL BRUSHINGS  06/06/2020   Procedure: BRONCHIAL BRUSHINGS;  Surgeon: Jason Nash, DO;  Location: [facility];  Service: Pulmonary;;   BRONCHIAL NEEDLE ASPIRATION BIOPSY  06/06/2020   Procedure: BRONCHIAL NEEDLE ASPIRATION BIOPSIES;  Surgeon: Jason Nash, DO;  Location: [facility];  Service: Pulmonary;;   BRONCHIAL WASHINGS  06/06/2020  Procedure: BRONCHIAL WASHINGS;  Surgeon: Jason Nash, DO;  Location: [facility];  Service: Pulmonary;;   COLONOSCOPY     OTHER SURGICAL HISTORY     fluid drawn off his testicle   PROSTATE BIOPSY     ROTATOR CUFF REPAIR Right    VIDEO BRONCHOSCOPY WITH ENDOBRONCHIAL NAVIGATION Right 06/06/2020   Procedure: VIDEO BRONCHOSCOPY WITH ENDOBRONCHIAL NAVIGATION;  Surgeon: Jason Nash, DO;  Location: [facility];  Service: Pulmonary;  Laterality: Right;   VIDEO  BRONCHOSCOPY WITH ENDOBRONCHIAL ULTRASOUND Bilateral 06/06/2020   Procedure: VIDEO BRONCHOSCOPY WITH ENDOBRONCHIAL ULTRASOUND;  Surgeon: Jason Nash, DO;  Location: [facility];  Service: Pulmonary;  Laterality: Bilateral;   WRIST SURGERY      REVIEW OF SYSTEMS:   Review of Systems  Constitutional: Negative for appetite change, chills, fatigue, fever and unexpected weight change.  HENT: Negative for mouth sores, nosebleeds, sore throat and trouble swallowing.   Eyes: Negative for eye problems and icterus.  Respiratory: Negative for cough, hemoptysis, shortness of breath and wheezing.   Cardiovascular: Negative for chest pain and leg swelling.  Gastrointestinal: Negative for abdominal pain, constipation, diarrhea, nausea and vomiting.  Genitourinary: Negative for bladder incontinence, difficulty urinating, dysuria, frequency and hematuria.   Musculoskeletal: Negative for back pain, gait problem, neck pain and neck stiffness.  Skin: Negative for itching and rash.  Neurological: Negative for dizziness, extremity weakness, gait problem, headaches, light-headedness and seizures.  Hematological: Negative for adenopathy. Does not bruise/bleed easily.  Psychiatric/Behavioral: Negative for confusion, depression and sleep disturbance. The patient is not nervous/anxious.     PHYSICAL EXAMINATION:  Blood pressure 135/83, pulse 98, temperature 97.8 F (36.6 C), temperature source Oral, resp. rate 16, height 5\' 10"  (1.778 m), weight 209 lb 1.6 oz (94.8 kg), SpO2 98 %.  ECOG PERFORMANCE STATUS: 1  Physical Exam  Constitutional: Oriented to person, place, and time and well-developed, well-nourished, and in no distress.  HENT:  Head: Normocephalic and atraumatic.  Mouth/Throat: Oropharynx is clear and moist. No oropharyngeal exudate.  Eyes: Conjunctivae are normal. Right eye exhibits no discharge. Left eye exhibits no discharge. No scleral icterus.  Neck: Normal range of motion. Neck supple.   Cardiovascular: Normal rate, regular rhythm, normal heart sounds and intact distal pulses.   Pulmonary/Chest: Effort normal and breath sounds normal. No respiratory distress. No wheezes. No rales.  Abdominal: Soft. Bowel sounds are normal. Exhibits no distension and no mass. There is no tenderness.  Musculoskeletal: Normal range of motion. Exhibits no edema.  Lymphadenopathy:    No cervical adenopathy.  Neurological: Alert and oriented to person, place, and time. Exhibits normal muscle tone. Gait normal. Coordination normal.  Skin: Skin is warm and dry. No rash noted. Not diaphoretic. No erythema. No pallor.  Psychiatric: Mood, memory and judgment normal.  Vitals reviewed.  LABORATORY DATA: Lab Results  Component Value Date   WBC 4.0 09/20/2021   HGB 10.8 (L) 09/20/2021   HCT 31.9 (L) 09/20/2021   MCV 89.1 09/20/2021   PLT 224 09/20/2021      Chemistry      Component Value Date/Time   NA 137 09/20/2021 1530   K 4.1 09/20/2021 1530   CL 111 09/20/2021 1530   CO2 20 (L) 09/20/2021 1530   BUN 40 (H) 09/20/2021 1530   CREATININE 1.97 (H) 09/20/2021 1530      Component Value Date/Time   CALCIUM 9.6 09/20/2021 1530   ALKPHOS 85 09/20/2021 1530   AST 16 09/20/2021 1530   ALT 11 09/20/2021 1530  BILITOT 0.5 09/20/2021 1530       RADIOGRAPHIC STUDIES:  CT Chest Wo Contrast  Result Date: 09/22/2021 CLINICAL DATA:  Non-small cell lung cancer. Assess treatment response. * Tracking Code: BO * EXAM: CT CHEST WITHOUT CONTRAST TECHNIQUE: Multidetector CT imaging of the chest was performed following the standard protocol without IV contrast. RADIATION DOSE REDUCTION: This exam was performed according to the departmental dose-optimization program which includes automated exposure control, adjustment of the mA and/or kV according to patient size and/or use of iterative reconstruction technique. COMPARISON:  Chest CT 05/29/2021 and 03/05/2021. FINDINGS: Cardiovascular: Minimal aortic and  coronary artery atherosclerosis. No acute vascular findings on noncontrast imaging. The heart size is normal. There is no pericardial effusion. Mediastinum/Nodes: There are no enlarged mediastinal, hilar or axillary lymph nodes.Small mediastinal lymph nodes are unchanged. Hilar assessment is limited by the lack of intravenous contrast, although the hilar contours appear unchanged. The thyroid gland, trachea and esophagus demonstrate no significant findings. Lungs/Pleura: No pleural effusion or pneumothorax. Treated right upper lobe nodule measures 10 x 8 mm on image 24/7 compared with 10 x 8 mm previously. There are surrounding radiation changes. Underlying centrilobular and paraseptal emphysema with traction bronchiectasis and mosaic attenuation at both lung bases. No new or enlarging pulmonary nodules. Upper abdomen: The visualized upper abdomen appears stable without significant findings. Musculoskeletal/Chest wall: There is no chest wall mass or suspicious osseous finding. Previous cervical fusion. Mild multilevel thoracic spondylosis. IMPRESSION: 1. Stable appearance of treated right upper lobe pulmonary nodule. 2. Stable surrounding treatment changes and underlying chronic lung disease. 3. No evidence of local recurrence or metastatic disease. 4. Coronary and aortic atherosclerosis (ICD10-I70.0). Emphysema (ICD10-J43.9). Electronically Signed   By: Jason [name] M.D.   On: 09/22/2021 11:39     ASSESSMENT/PLAN:  This is a very pleasant 71 year old African-American male  diagnosed with stage IIIa/C (T1c, N2-N3, M0) non-small cell lung cancer, squamous cell carcinoma.  He presented with a right upper lobe lung nodule in addition to subcarinal lymphadenopathy and suspicious AP window lymph node involvement.  He was diagnosed in May 2022.  Also diagnosed with prostate adenocarcinoma with a Gleason score of 9.  He was diagnosed in April 2022.   The patient completed concurrent chemoradiation with carboplatin  for an AUC of 2 and paclitaxel 45 mg per metered squared.  He is status post 7 cycles and tolerated it well.    The patient completed consolidation immunotherapy with Imfinzi 1500 mg IV every 4 weeks.  He is status post 13 cycles and tolerated it well.   Patient recently had a restaging CT scan performed.  Dr. Julien Nordmann personally independently reviewed the scan discussed results with the patient today.  The scan showed no evidence of disease progression.   Dr. Julien Nordmann recommends that the patient continue on observation with a restaging CT scan of the chest in 3 months.  We will see him back for follow-up visit at that time for evaluation and repeat blood work and to review his scan results in 3 months.  The patient was advised to call immediately if he has any concerning symptoms in the interval. The patient voices understanding of current disease status and treatment options and is in agreement with the current care plan. All questions were answered. The patient knows to call the clinic with any problems, questions or concerns. We can certainly see the patient much sooner if necessary  Orders Placed This Encounter  Procedures   CT Chest Wo Contrast    Standing  Status:   Future    Standing Expiration Date:   09/24/2022    Order Specific Question:   Preferred imaging location?    Answer:   [facility]   CBC with Differential ([facility] Only)    Standing Status:   Future    Standing Expiration Date:   09/25/2022   CMP ([facility] only)    Standing Status:   Future    Standing Expiration Date:   09/25/2022      Tobe Sos Virgal Warmuth, PA-C 09/24/21  ADDENDUM: Hematology/Oncology Attending: I had a face-to-face encounter with the patient today.  I reviewed his record, lab, scan and recommended his care plan.  This is a very pleasant 71 years old African-American male with a stage IIIc (T1c, N3, M0) non-small cell lung cancer, squamous cell carcinoma diagnosed in April  2022 status post a course of concurrent chemoradiation with weekly carboplatin and paclitaxel followed by consolidation treatment with immunotherapy with Imfinzi for 13 cycles.  The patient has been tolerating this treatment well with no concerning adverse effects. He had repeat CT scan of the chest performed recently.  I personally and independently reviewed the scans and discussed the result with the patient today. His scan showed no concerning findings for disease progression. I recommended for him to continue on observation with repeat CT scan of the chest in 3 months. The patient was advised to follow with his nephrologist for the renal insufficiency. He was advised to call immediately if he has any other concerning symptoms in the interval. The total time spent in the appointment was 30 minutes. Disclaimer: This note was dictated with voice recognition software. Similar sounding words can inadvertently be transcribed and may be missed upon review. Eilleen Kempf, MD

## 2021-09-20 ENCOUNTER — Other Ambulatory Visit: Payer: Medicare PPO

## 2021-09-20 ENCOUNTER — Ambulatory Visit (HOSPITAL_COMMUNITY)
Admission: RE | Admit: 2021-09-20 | Discharge: 2021-09-20 | Disposition: A | Discharge status: Home / Self Care | Payer: Medicare PPO | Source: Ambulatory Visit | Attending: Physician Assistant | Admitting: Physician Assistant

## 2021-09-20 ENCOUNTER — Inpatient Hospital Stay: Payer: Medicare PPO | Attending: Internal Medicine

## 2021-09-20 ENCOUNTER — Other Ambulatory Visit: Payer: Self-pay

## 2021-09-20 DIAGNOSIS — I700 Atherosclerosis of aorta: Secondary | ICD-10-CM | POA: Insufficient documentation

## 2021-09-20 DIAGNOSIS — J439 Emphysema, unspecified: Secondary | ICD-10-CM | POA: Insufficient documentation

## 2021-09-20 DIAGNOSIS — C3490 Malignant neoplasm of unspecified part of unspecified bronchus or lung: Secondary | ICD-10-CM | POA: Diagnosis not present

## 2021-09-20 DIAGNOSIS — C3411 Malignant neoplasm of upper lobe, right bronchus or lung: Secondary | ICD-10-CM | POA: Insufficient documentation

## 2021-09-20 DIAGNOSIS — C3491 Malignant neoplasm of unspecified part of right bronchus or lung: Secondary | ICD-10-CM

## 2021-09-20 DIAGNOSIS — Z923 Personal history of irradiation: Secondary | ICD-10-CM | POA: Insufficient documentation

## 2021-09-20 DIAGNOSIS — I129 Hypertensive chronic kidney disease with stage 1 through stage 4 chronic kidney disease, or unspecified chronic kidney disease: Secondary | ICD-10-CM | POA: Insufficient documentation

## 2021-09-20 DIAGNOSIS — J479 Bronchiectasis, uncomplicated: Secondary | ICD-10-CM | POA: Insufficient documentation

## 2021-09-20 DIAGNOSIS — Z79899 Other long term (current) drug therapy: Secondary | ICD-10-CM | POA: Insufficient documentation

## 2021-09-20 DIAGNOSIS — M47814 Spondylosis without myelopathy or radiculopathy, thoracic region: Secondary | ICD-10-CM | POA: Insufficient documentation

## 2021-09-20 DIAGNOSIS — Z981 Arthrodesis status: Secondary | ICD-10-CM | POA: Insufficient documentation

## 2021-09-20 DIAGNOSIS — R590 Localized enlarged lymph nodes: Secondary | ICD-10-CM | POA: Insufficient documentation

## 2021-09-20 DIAGNOSIS — C61 Malignant neoplasm of prostate: Secondary | ICD-10-CM | POA: Insufficient documentation

## 2021-09-20 DIAGNOSIS — N183 Chronic kidney disease, stage 3 unspecified: Secondary | ICD-10-CM | POA: Insufficient documentation

## 2021-09-20 LAB — CMP (CANCER CENTER ONLY)
ALT: 11 U/L (ref 0–44)
AST: 16 U/L (ref 15–41)
Albumin: 4.2 g/dL (ref 3.5–5.0)
Alkaline Phosphatase: 85 U/L (ref 38–126)
Anion gap: 6 (ref 5–15)
BUN: 40 mg/dL — ABNORMAL HIGH (ref 8–23)
CO2: 20 mmol/L — ABNORMAL LOW (ref 22–32)
Calcium: 9.6 mg/dL (ref 8.9–10.3)
Chloride: 111 mmol/L (ref 98–111)
Creatinine: 1.97 mg/dL — ABNORMAL HIGH (ref 0.61–1.24)
GFR, Estimated: 35 mL/min — ABNORMAL LOW (ref >60)
Glucose, Bld: 105 mg/dL — ABNORMAL HIGH (ref 70–99)
Potassium: 4.1 mmol/L (ref 3.5–5.1)
Sodium: 137 mmol/L (ref 135–145)
Total Bilirubin: 0.5 mg/dL (ref 0.3–1.2)
Total Protein: 7.2 g/dL (ref 6.5–8.1)

## 2021-09-20 LAB — CBC WITH DIFFERENTIAL (CANCER CENTER ONLY)
Abs Immature Granulocytes: 0 10*3/uL (ref 0.00–0.07)
Basophils Absolute: 0 10*3/uL (ref 0.0–0.1)
Basophils Relative: 1 %
Eosinophils Absolute: 0.4 10*3/uL (ref 0.0–0.5)
Eosinophils Relative: 9 %
HCT: 31.9 % — ABNORMAL LOW (ref 39.0–52.0)
Hemoglobin: 10.8 g/dL — ABNORMAL LOW (ref 13.0–17.0)
Immature Granulocytes: 0 %
Lymphocytes Relative: 27 %
Lymphs Abs: 1.1 10*3/uL (ref 0.7–4.0)
MCH: 30.2 pg (ref 26.0–34.0)
MCHC: 33.9 g/dL (ref 30.0–36.0)
MCV: 89.1 fL (ref 80.0–100.0)
Monocytes Absolute: 0.4 10*3/uL (ref 0.1–1.0)
Monocytes Relative: 10 %
Neutro Abs: 2.2 10*3/uL (ref 1.7–7.7)
Neutrophils Relative %: 53 %
Platelet Count: 224 10*3/uL (ref 150–400)
RBC: 3.58 MIL/uL — ABNORMAL LOW (ref 4.22–5.81)
RDW: 14.3 % (ref 11.5–15.5)
WBC Count: 4 10*3/uL (ref 4.0–10.5)
nRBC: 0 % (ref 0.0–0.2)

## 2021-09-24 ENCOUNTER — Inpatient Hospital Stay (HOSPITAL_BASED_OUTPATIENT_CLINIC_OR_DEPARTMENT_OTHER): Payer: Medicare PPO | Admitting: Physician Assistant

## 2021-09-24 VITALS — BP 135/83 | HR 98 | Temp 97.8°F | Resp 16 | Ht 70.0 in | Wt 209.1 lb

## 2021-09-24 DIAGNOSIS — R590 Localized enlarged lymph nodes: Secondary | ICD-10-CM | POA: Diagnosis not present

## 2021-09-24 DIAGNOSIS — N183 Chronic kidney disease, stage 3 unspecified: Secondary | ICD-10-CM | POA: Diagnosis not present

## 2021-09-24 DIAGNOSIS — J479 Bronchiectasis, uncomplicated: Secondary | ICD-10-CM | POA: Diagnosis not present

## 2021-09-24 DIAGNOSIS — I129 Hypertensive chronic kidney disease with stage 1 through stage 4 chronic kidney disease, or unspecified chronic kidney disease: Secondary | ICD-10-CM | POA: Diagnosis not present

## 2021-09-24 DIAGNOSIS — M47814 Spondylosis without myelopathy or radiculopathy, thoracic region: Secondary | ICD-10-CM | POA: Diagnosis not present

## 2021-09-24 DIAGNOSIS — Z923 Personal history of irradiation: Secondary | ICD-10-CM | POA: Diagnosis not present

## 2021-09-24 DIAGNOSIS — C3411 Malignant neoplasm of upper lobe, right bronchus or lung: Secondary | ICD-10-CM | POA: Diagnosis not present

## 2021-09-24 DIAGNOSIS — C3491 Malignant neoplasm of unspecified part of right bronchus or lung: Secondary | ICD-10-CM

## 2021-09-24 DIAGNOSIS — Z981 Arthrodesis status: Secondary | ICD-10-CM | POA: Diagnosis not present

## 2021-09-24 DIAGNOSIS — C61 Malignant neoplasm of prostate: Secondary | ICD-10-CM | POA: Diagnosis not present

## 2021-09-24 DIAGNOSIS — I700 Atherosclerosis of aorta: Secondary | ICD-10-CM | POA: Diagnosis not present

## 2021-09-24 DIAGNOSIS — J439 Emphysema, unspecified: Secondary | ICD-10-CM | POA: Diagnosis not present

## 2021-09-24 DIAGNOSIS — Z79899 Other long term (current) drug therapy: Secondary | ICD-10-CM | POA: Diagnosis not present

## 2021-09-26 ENCOUNTER — Other Ambulatory Visit: Payer: Self-pay

## 2021-11-07 ENCOUNTER — Telehealth: Payer: Self-pay | Admitting: Physician Assistant

## 2021-11-07 NOTE — Telephone Encounter (Signed)
Called patient regarding upcoming November appointments, left a voicemail.

## 2021-12-06 ENCOUNTER — Emergency Department (HOSPITAL_COMMUNITY): Payer: Medicare PPO

## 2021-12-06 ENCOUNTER — Telehealth: Payer: Self-pay | Admitting: *Deleted

## 2021-12-06 ENCOUNTER — Telehealth: Payer: Self-pay | Admitting: Physician Assistant

## 2021-12-06 ENCOUNTER — Other Ambulatory Visit: Payer: Self-pay

## 2021-12-06 ENCOUNTER — Emergency Department (HOSPITAL_COMMUNITY)
Admission: EM | Admit: 2021-12-06 | Discharge: 2021-12-06 | Disposition: A | Discharge status: Home / Self Care | Payer: Medicare PPO | Attending: Emergency Medicine | Admitting: Emergency Medicine

## 2021-12-06 ENCOUNTER — Encounter (HOSPITAL_COMMUNITY): Payer: Self-pay | Admitting: Emergency Medicine

## 2021-12-06 DIAGNOSIS — M545 Low back pain, unspecified: Secondary | ICD-10-CM | POA: Insufficient documentation

## 2021-12-06 DIAGNOSIS — M25551 Pain in right hip: Secondary | ICD-10-CM | POA: Diagnosis not present

## 2021-12-06 DIAGNOSIS — M4316 Spondylolisthesis, lumbar region: Secondary | ICD-10-CM | POA: Diagnosis not present

## 2021-12-06 DIAGNOSIS — Z8546 Personal history of malignant neoplasm of prostate: Secondary | ICD-10-CM | POA: Diagnosis not present

## 2021-12-06 MED ORDER — DIAZEPAM 5 MG PO TABS
5.0000 mg | ORAL_TABLET | Freq: Once | ORAL | Status: AC
  Administered 2021-12-06: 5 mg via ORAL
  Filled 2021-12-06: qty 1

## 2021-12-06 MED ORDER — OXYCODONE-ACETAMINOPHEN 5-325 MG PO TABS: 1.0000 | ORAL_TABLET | Freq: Four times a day (QID) | ORAL | 0 refills | Status: AC | PRN

## 2021-12-06 MED ORDER — OXYCODONE-ACETAMINOPHEN 5-325 MG PO TABS
1.0000 | ORAL_TABLET | Freq: Once | ORAL | Status: AC
  Administered 2021-12-06: 1 via ORAL
  Filled 2021-12-06: qty 1

## 2021-12-06 MED ORDER — METHOCARBAMOL 500 MG PO TABS: 500.0000 mg | ORAL_TABLET | Freq: Two times a day (BID) | ORAL | 0 refills | Status: AC

## 2021-12-06 NOTE — ED Provider Notes (Signed)
[facility] DEPT Provider Note   CSN: [id number] Arrival date & time: 12/06/21  1446     History  Chief Complaint  Patient presents with   Back Pain    Jason [name]. is a 71 y.o. male.  71 year old male presents with 3 days of right lower lumbar paraspinal pain which is atraumatic.  Pain is dull and worse with movement.  Does not radiate down his leg.  No bowel or bladder dysfunction.  Does have remote history of prostate cancer.  Denies any recent night sweats or weight loss.  Pain better with remaining still.  Unrelieved with home medications       Home Medications Prior to Admission medications   Medication Sig Start Date End Date Taking? Authorizing Provider  acetaminophen (TYLENOL) 325 MG tablet Take 650 mg by mouth every 6 (six) hours as needed.    [provider]  atorvastatin (LIPITOR) 10 MG tablet Take 10 mg by mouth daily. 04/25/19   [provider]  azelastine (ASTELIN) 0.1 % nasal spray Place 1 spray into both nostrils 2 (two) times daily as needed for rhinitis. Use in each nostril as directed    [provider]  fluticasone (FLONASE) 50 MCG/ACT nasal spray Place 2 sprays into both nostrils daily. 04/21/19   [provider]  levocetirizine (XYZAL) 5 MG tablet Take 5 mg by mouth every evening.    [provider]  montelukast (SINGULAIR) 10 MG tablet Take 10 mg by mouth at bedtime.    [provider]  valsartan-hydrochlorothiazide (DIOVAN-HCT) 320-25 MG tablet Take 1 tablet by mouth daily. 04/25/19   [provider]  venlafaxine (EFFEXOR) 25 MG tablet 1 tablet with food    [provider]      Allergies    Patient has no known allergies.    Review of Systems   Review of Systems  All other systems reviewed and are negative.   Physical Exam Updated Vital Signs BP (!) 130/98   Pulse 89   Temp 98 F (36.7 C) (Oral)   Resp 16   SpO2 95%  Physical  Exam Vitals and nursing note reviewed.  Constitutional:      General: He is not in acute distress.    Appearance: Normal appearance. He is well-developed. He is not toxic-appearing.  HENT:     Head: Normocephalic and atraumatic.  Eyes:     General: Lids are normal.     Conjunctiva/sclera: Conjunctivae normal.     Pupils: Pupils are equal, round, and reactive to light.  Neck:     Thyroid: No thyroid mass.     Trachea: No tracheal deviation.  Cardiovascular:     Rate and Rhythm: Normal rate and regular rhythm.     Heart sounds: Normal heart sounds. No murmur heard.    No gallop.  Pulmonary:     Effort: Pulmonary effort is normal. No respiratory distress.     Breath sounds: Normal breath sounds. No stridor. No decreased breath sounds, wheezing, rhonchi or rales.  Abdominal:     General: There is no distension.     Palpations: Abdomen is soft.     Tenderness: There is no abdominal tenderness. There is no rebound.  Musculoskeletal:        General: No tenderness. Normal range of motion.     Cervical back: Normal range of motion and neck supple.       Back:  Skin:    General: Skin is warm and dry.  Findings: No abrasion or rash.  Neurological:     Mental Status: He is alert and oriented to person, place, and time. Mental status is at baseline.     GCS: GCS eye subscore is 4. GCS verbal subscore is 5. GCS motor subscore is 6.     Cranial Nerves: No cranial nerve deficit.     Sensory: No sensory deficit.     Motor: Motor function is intact.     Gait: Gait is intact.     Comments: Strength is 5 of 5 in lower extremities.  No foot dragging.  Psychiatric:        Attention and Perception: Attention normal.        Speech: Speech normal.        Behavior: Behavior normal.     ED Results / Procedures / Treatments   Labs (all labs ordered are listed, but only abnormal results are displayed) Labs Reviewed - No data to display  EKG None  Radiology DG Lumbar Spine  Complete  Result Date: 12/06/2021 CLINICAL DATA:  Increase in back pain and left leg weakness. Patient has a history of lung cancer and prostate cancer. EXAM: LUMBAR SPINE - COMPLETE 4+ VIEW COMPARISON:  Lumbar spine radiograph 02/26/2021 FINDINGS: There are 5 lumbar type vertebral bodies. Vertebral body heights are maintained. No discrete osseous lesions visualized. There are bilateral pelvic phleboliths. Degenerative changes of the bilateral hip joints. Overlying bowel gas limits the ability to assess the sacrum. Redemonstrated lower lumbar spine predominant degenerative change with grade 1 anterolisthesis of L4 on L5, severe facet degenerative changes at L4-L5 and L5-S1. IMPRESSION: 1. No acute radiographic abnormality of the lumbar spine. 2. Redemonstrated lower lumbar spine predominant degenerative change with grade 1 anterolisthesis of L4 on L5. Electronically Signed   By: Marin Roberts M.D.   On: 12/06/2021 16:51    Procedures Procedures    Medications Ordered in ED Medications  diazepam (VALIUM) tablet 5 mg (has no administration in time range)  oxyCODONE-acetaminophen (PERCOCET/ROXICET) 5-325 MG per tablet 1 tablet (has no administration in time range)    ED Course/ Medical Decision Making/ A&P                           Medical Decision Making Amount and/or Complexity of Data Reviewed Radiology: ordered.  Risk Prescription drug management.   Patient complaining of several days of right paraspinal muscle as well as iliac crest pain.  X-ray of patient's lumbar sacral spine along with pelvis per my review and interpretation showed no evidence of fracture or bony metastasis.  Given pain control here.  His gait is steady without assistance.  Will discharge home        Final Clinical Impression(s) / ED Diagnoses Final diagnoses:  None    Rx / DC Orders ED Discharge Orders     None         Lacretia Leigh, MD 12/06/21 1914

## 2021-12-06 NOTE — Telephone Encounter (Signed)
Attempted to contact patient per r/s request from provider. Will mail calendar

## 2021-12-06 NOTE — ED Provider Triage Note (Signed)
Emergency Medicine Provider Triage Evaluation Note  Jason [name]. , a 71 y.o. male  was evaluated in triage.  Patient complains of back pain.  Reports he has always had some back pain that he could treat with Tylenol and Goody powder however that is no longer cutting it over the past week.  Says it is mostly on the bilateral paraspinals.  Also says that his right leg cannot support his weight in his left leg feels weak.  History of prostate cancer, last radiation treatment a couple of months ago.  On chart review patient is also following for non-small cell lung cancer  Review of Systems  Positive:  Negative:   Physical Exam  BP (!) 130/98   Pulse 89   Temp 98 F (36.7 C) (Oral)   Resp 16   SpO2 95%  Gen:   Awake, no distress   Resp:  Normal effort  MSK:   Moves extremities without difficulty  Other:  Normal range of motion of bilateral lower extremities.  Normal strength to knee extension  Medical Decision Making  Medically screening exam initiated at 3:47 PM.  Appropriate orders placed.  Jason [name]. was informed that the remainder of the evaluation will be completed by another provider, this initial triage assessment does not replace that evaluation, and the importance of remaining in the ED until their evaluation is complete.    X-ray ordered   Jason Hammock, PA-C 12/06/21 1550

## 2021-12-06 NOTE — Telephone Encounter (Signed)
I am scheduled to see this patient later this month to review his upcoming restaging CT scan.  His scan has not been scheduled at this time.  I attempted to call to give him instructions on how to schedule this.  Unable to leave a voicemail due to his mailbox being full.

## 2021-12-06 NOTE — ED Triage Notes (Signed)
Pt reports lower back pain x 1 week. Reports he was seen at PCP for same and no relief. Ca pt

## 2021-12-06 NOTE — Telephone Encounter (Signed)
Patient called to report that patient has been experiencing pain for the past 3 days and has been unable to walk.  She wanted providers to know that he is in the Emergency room.

## 2021-12-07 ENCOUNTER — Other Ambulatory Visit: Payer: Self-pay

## 2021-12-14 ENCOUNTER — Telehealth: Payer: Self-pay | Admitting: Physician Assistant

## 2021-12-14 NOTE — Telephone Encounter (Signed)
The patient is scheduled to see me and Dr. Julien Nordmann for a follow up on 12/31/21. His restaging CT scan is not scheduled. I called and left a detailed voicemail reminding him to call radiology scheduling to schedule his CT scan before his appointment. I left the number to central scheduling on the voicemail.

## 2021-12-15 ENCOUNTER — Other Ambulatory Visit: Payer: Self-pay

## 2021-12-19 ENCOUNTER — Inpatient Hospital Stay: Payer: Medicare PPO | Attending: Physician Assistant

## 2021-12-19 ENCOUNTER — Ambulatory Visit (HOSPITAL_COMMUNITY)
Admission: RE | Admit: 2021-12-19 | Discharge: 2021-12-19 | Disposition: A | Discharge status: Home / Self Care | Payer: Medicare PPO | Source: Ambulatory Visit | Attending: Physician Assistant | Admitting: Physician Assistant

## 2021-12-19 ENCOUNTER — Other Ambulatory Visit: Payer: Self-pay

## 2021-12-19 DIAGNOSIS — I700 Atherosclerosis of aorta: Secondary | ICD-10-CM | POA: Diagnosis not present

## 2021-12-19 DIAGNOSIS — Z79899 Other long term (current) drug therapy: Secondary | ICD-10-CM | POA: Diagnosis not present

## 2021-12-19 DIAGNOSIS — J439 Emphysema, unspecified: Secondary | ICD-10-CM | POA: Diagnosis not present

## 2021-12-19 DIAGNOSIS — C3491 Malignant neoplasm of unspecified part of right bronchus or lung: Secondary | ICD-10-CM | POA: Insufficient documentation

## 2021-12-19 DIAGNOSIS — C3490 Malignant neoplasm of unspecified part of unspecified bronchus or lung: Secondary | ICD-10-CM | POA: Diagnosis not present

## 2021-12-19 DIAGNOSIS — C3411 Malignant neoplasm of upper lobe, right bronchus or lung: Secondary | ICD-10-CM | POA: Insufficient documentation

## 2021-12-19 LAB — CBC WITH DIFFERENTIAL (CANCER CENTER ONLY)
Abs Immature Granulocytes: 0 10*3/uL (ref 0.00–0.07)
Basophils Absolute: 0.1 10*3/uL (ref 0.0–0.1)
Basophils Relative: 1 %
Eosinophils Absolute: 0.3 10*3/uL (ref 0.0–0.5)
Eosinophils Relative: 8 %
HCT: 29.7 % — ABNORMAL LOW (ref 39.0–52.0)
Hemoglobin: 9.8 g/dL — ABNORMAL LOW (ref 13.0–17.0)
Immature Granulocytes: 0 %
Lymphocytes Relative: 25 %
Lymphs Abs: 1.1 10*3/uL (ref 0.7–4.0)
MCH: 30.2 pg (ref 26.0–34.0)
MCHC: 33 g/dL (ref 30.0–36.0)
MCV: 91.4 fL (ref 80.0–100.0)
Monocytes Absolute: 0.5 10*3/uL (ref 0.1–1.0)
Monocytes Relative: 11 %
Neutro Abs: 2.5 10*3/uL (ref 1.7–7.7)
Neutrophils Relative %: 55 %
Platelet Count: 316 10*3/uL (ref 150–400)
RBC: 3.25 MIL/uL — ABNORMAL LOW (ref 4.22–5.81)
RDW: 15.9 % — ABNORMAL HIGH (ref 11.5–15.5)
WBC Count: 4.5 10*3/uL (ref 4.0–10.5)
nRBC: 0 % (ref 0.0–0.2)

## 2021-12-19 LAB — CMP (CANCER CENTER ONLY)
ALT: 12 U/L (ref 0–44)
AST: 17 U/L (ref 15–41)
Albumin: 3.7 g/dL (ref 3.5–5.0)
Alkaline Phosphatase: 94 U/L (ref 38–126)
Anion gap: 9 (ref 5–15)
BUN: 25 mg/dL — ABNORMAL HIGH (ref 8–23)
CO2: 18 mmol/L — ABNORMAL LOW (ref 22–32)
Calcium: 9.3 mg/dL (ref 8.9–10.3)
Chloride: 112 mmol/L — ABNORMAL HIGH (ref 98–111)
Creatinine: 1.83 mg/dL — ABNORMAL HIGH (ref 0.61–1.24)
GFR, Estimated: 39 mL/min — ABNORMAL LOW (ref >60)
Glucose, Bld: 99 mg/dL (ref 70–99)
Potassium: 4 mmol/L (ref 3.5–5.1)
Sodium: 139 mmol/L (ref 135–145)
Total Bilirubin: 0.7 mg/dL (ref 0.3–1.2)
Total Protein: 7.8 g/dL (ref 6.5–8.1)

## 2021-12-20 ENCOUNTER — Other Ambulatory Visit: Payer: Self-pay

## 2021-12-24 ENCOUNTER — Ambulatory Visit: Payer: Medicare PPO | Admitting: Physician Assistant

## 2021-12-27 ENCOUNTER — Other Ambulatory Visit: Payer: Self-pay

## 2021-12-28 NOTE — Progress Notes (Unsigned)
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  PRIOR THERAPY: 1) Concurrent chemoradiation with weekly carboplatin for AUC of 2 and paclitaxel 45 Mg/M2. Status post 7 cycles. First dose on 07/04/20 and last cycle was giving August 14, 2020 with partial response.  2) Consolidation treatment with immunotherapy with Imfinzi 1500 Mg IV every 4 weeks. Last dose 08/23/21. Status post 13 cycles.  CURRENT THERAPY: Observation  INTERVAL HISTORY: Jason [name]. 71 y.o. male returns to the clinic today for a 30-month follow-up visit.  The patient is feeling well today without any concerning complaints. Earlier this month, he presented to the emergency room with low back pain.  He has a history of back surgeries. He had an Xray which showed degenerative changes. No metastatic bone lesions were appreciated. He denies any fever, chills, or night sweats.  He sometimes gets hot flashes related to his injection for his prostate cancer.  He follows closely with nephrology for his current chronic kidney disease.  He denies any chest pain, shortness of breath, cough, or hemoptysis.  Denies any nausea, vomiting, diarrhea, or constipation.  Denies any headache or visual changes.  Denies any rashes or skin changes.  The patient recently had a restaging CT scan performed.  He is here today for evaluation to review his scan results.     MEDICAL HISTORY: Past Medical History:  Diagnosis Date   Arthritis    back   Hypertension    Prostate cancer ([place])    Tobacco use     ALLERGIES:  has No Known Allergies.  MEDICATIONS:  Current  Outpatient Medications  Medication Sig Dispense Refill   acetaminophen (TYLENOL) 325 MG tablet Take 650 mg by mouth every 6 (six) hours as needed.     atorvastatin (LIPITOR) 10 MG tablet Take 10 mg by mouth daily.     azelastine (ASTELIN) 0.1 % nasal spray Place 1 spray into both nostrils 2 (two) times daily as needed for rhinitis. Use in each nostril as directed     fluticasone (FLONASE) 50 MCG/ACT nasal spray Place 2 sprays into both nostrils daily.     levocetirizine (XYZAL) 5 MG tablet Take 5 mg by mouth every evening.     methocarbamol (ROBAXIN) 500 MG tablet Take 1 tablet (500 mg total) by mouth 2 (two) times daily. 20 tablet 0   montelukast (SINGULAIR) 10 MG tablet Take 10 mg by mouth at bedtime.     oxyCODONE-acetaminophen (PERCOCET/ROXICET) 5-325 MG tablet Take 1 tablet by mouth every 6 (six) hours as needed for severe pain. 15 tablet 0   valsartan-hydrochlorothiazide (DIOVAN-HCT) 320-25 MG tablet Take 1 tablet by mouth daily.     venlafaxine (EFFEXOR) 25 MG tablet 1 tablet with food     No current facility-administered medications for this visit.    SURGICAL HISTORY:  Past Surgical History:  Procedure Laterality Date   BACK SURGERY     BRONCHIAL BIOPSY  06/06/2020   Procedure: BRONCHIAL BIOPSIES;  Surgeon: Garner Nash, DO;  Location: [facility];  Service: Pulmonary;;   BRONCHIAL BRUSHINGS  06/06/2020   Procedure: BRONCHIAL BRUSHINGS;  Surgeon: Garner Nash, DO;  [facility]  ENDOSCOPY;  Service: Pulmonary;;   BRONCHIAL NEEDLE ASPIRATION BIOPSY  06/06/2020   Procedure: BRONCHIAL NEEDLE ASPIRATION BIOPSIES;  Surgeon: Garner Nash, DO;  Location: [facility];  Service: Pulmonary;;   BRONCHIAL WASHINGS  06/06/2020   Procedure: BRONCHIAL WASHINGS;  Surgeon: Garner Nash, DO;  Location: [facility];  Service: Pulmonary;;   COLONOSCOPY     OTHER SURGICAL HISTORY     fluid drawn off his testicle   PROSTATE BIOPSY     ROTATOR CUFF REPAIR Right    VIDEO BRONCHOSCOPY  WITH ENDOBRONCHIAL NAVIGATION Right 06/06/2020   Procedure: VIDEO BRONCHOSCOPY WITH ENDOBRONCHIAL NAVIGATION;  Surgeon: Garner Nash, DO;  Location: [facility];  Service: Pulmonary;  Laterality: Right;   VIDEO BRONCHOSCOPY WITH ENDOBRONCHIAL ULTRASOUND Bilateral 06/06/2020   Procedure: VIDEO BRONCHOSCOPY WITH ENDOBRONCHIAL ULTRASOUND;  Surgeon: Garner Nash, DO;  Location: [facility];  Service: Pulmonary;  Laterality: Bilateral;   WRIST SURGERY      REVIEW OF SYSTEMS:   Review of Systems  Constitutional: Negative for appetite change, chills, fatigue, fever and unexpected weight change.  HENT: Negative for mouth sores, nosebleeds, sore throat and trouble swallowing.   Eyes: Negative for eye problems and icterus.  Respiratory: Negative for cough, hemoptysis, shortness of breath and wheezing.   Cardiovascular: Negative for chest pain and leg swelling.  Gastrointestinal: Negative for abdominal pain, constipation, diarrhea, nausea and vomiting.  Genitourinary: Negative for bladder incontinence, difficulty urinating, dysuria, frequency and hematuria.   Musculoskeletal: Negative for back pain, gait problem, neck pain and neck stiffness.  Skin: Negative for itching and rash.  Neurological: Negative for dizziness, extremity weakness, gait problem, headaches, light-headedness and seizures.  Hematological: Negative for adenopathy. Does not bruise/bleed easily.  Psychiatric/Behavioral: Negative for confusion, depression and sleep disturbance. The patient is not nervous/anxious.     PHYSICAL EXAMINATION:  Blood pressure 122/82, pulse (!) 105, temperature 98.1 F (36.7 C), temperature source Oral, resp. rate 17, weight 209 lb 4.8 oz (94.9 kg), SpO2 94 %.  ECOG PERFORMANCE STATUS: 1  Physical Exam  Constitutional: Oriented to person, place, and time and well-developed, well-nourished, and in no distress.  HENT:  Head: Normocephalic and atraumatic.  Mouth/Throat: Oropharynx is clear and  moist. No oropharyngeal exudate.  Eyes: Conjunctivae are normal. Right eye exhibits no discharge. Left eye exhibits no discharge. No scleral icterus.  Neck: Normal range of motion. Neck supple.  Cardiovascular: Normal rate, regular rhythm, normal heart sounds and intact distal pulses.   Pulmonary/Chest: Effort normal and breath sounds normal. No respiratory distress. No wheezes. No rales.  Abdominal: Soft. Bowel sounds are normal. Exhibits no distension and no mass. There is no tenderness.  Musculoskeletal: Normal range of motion. Exhibits no edema.  Lymphadenopathy:    No cervical adenopathy.  Neurological: Alert and oriented to person, place, and time. Exhibits normal muscle tone. Gait normal. Coordination normal.  Skin: Skin is warm and dry. No rash noted. Not diaphoretic. No erythema. No pallor.  Psychiatric: Mood, memory and judgment normal.  Vitals reviewed  LABORATORY DATA: Lab Results  Component Value Date   WBC 4.5 12/19/2021   HGB 9.8 (L) 12/19/2021   HCT 29.7 (L) 12/19/2021   MCV 91.4 12/19/2021   PLT 316 12/19/2021      Chemistry      Component Value Date/Time   NA 139 12/19/2021 1137   K 4.0 12/19/2021 1137   CL 112 (H) 12/19/2021 1137   CO2 18 (L) 12/19/2021 1137   BUN 25 (H) 12/19/2021 1137  CREATININE 1.83 (H) 12/19/2021 1137      Component Value Date/Time   CALCIUM 9.3 12/19/2021 1137   ALKPHOS 94 12/19/2021 1137   AST 17 12/19/2021 1137   ALT 12 12/19/2021 1137   BILITOT 0.7 12/19/2021 1137       RADIOGRAPHIC STUDIES:  CT Chest Wo Contrast  Result Date: 12/21/2021 CLINICAL DATA:  71 year old male presents for follow-up of non-small cell lung cancer. * Tracking Code: BO * EXAM: CT CHEST WITHOUT CONTRAST TECHNIQUE: Multidetector CT imaging of the chest was performed following the standard protocol without IV contrast. RADIATION DOSE REDUCTION: This exam was performed according to the departmental dose-optimization program which includes automated  exposure control, adjustment of the mA and/or kV according to patient size and/or use of iterative reconstruction technique. COMPARISON:  September 20, 2021. FINDINGS: Cardiovascular: Calcified aortic atherosclerosis. Normal heart size. No pericardial effusion or nodularity. Normal caliber of central pulmonary vessels. Mediastinum/Nodes: No thoracic inlet, axillary, mediastinal or hilar adenopathy. Esophagus grossly normal. Lungs/Pleura: Paramediastinal fibrosis in the RIGHT upper lobe related to post treatment changes and mild subpleural reticulation similar to previous imaging. Small nodular area amidst these changes at 8 x 5 mm on image 21/7, previously 10 x 8 mm Patchy bibasilar ground-glass and septal thickening with mild bronchiectatic changes also unchanged. No sign of pleural effusion. No lobar consolidative process. Airways are patent. Stable LEFT lower lobe pulmonary nodule (image 88/7) 5 mm. Signs of pulmonary emphysema. Upper Abdomen: Incidental imaging of upper abdominal contents is unremarkable with respect imaged portions of the liver, gallbladder, pancreas, spleen, adrenal glands and kidneys. No acute gastrointestinal findings to the extent evaluated. Musculoskeletal: No acute bone finding. No destructive bone process. Spinal degenerative changes. IMPRESSION: No new or progressive findings. Stable post treatment changes in the RIGHT upper lobe. Stable basilar chronic changes and interstitial thickening. Stable LEFT lower lobe pulmonary nodule at 5 mm size, likely benign as it is unchanged since April of 2022. Aortic Atherosclerosis (ICD10-I70.0) and Emphysema (ICD10-J43.9). Electronically Signed   By: Zetta Bills M.D.   On: 12/21/2021 13:24   DG Hip Unilat W or Wo Pelvis 2-3 Views Right  Result Date: 12/06/2021 CLINICAL DATA:  Back and right hip pain; history of prostate cancer with last radiation treatment a couple months ago EXAM: DG HIP (WITH OR WITHOUT PELVIS) 2-3V RIGHT COMPARISON:   Radiographs 02/26/2021 FINDINGS: No acute fracture or dislocation. Advanced degenerative changes about the pubic symphysis. No soft tissue abnormality. IMPRESSION: No acute abnormality. Advanced degenerative changes at the pubic symphysis. Electronically Signed   By: Placido Sou M.D.   On: 12/06/2021 19:04   DG Lumbar Spine Complete  Result Date: 12/06/2021 CLINICAL DATA:  Increase in back pain and left leg weakness. Patient has a history of lung cancer and prostate cancer. EXAM: LUMBAR SPINE - COMPLETE 4+ VIEW COMPARISON:  Lumbar spine radiograph 02/26/2021 FINDINGS: There are 5 lumbar type vertebral bodies. Vertebral body heights are maintained. No discrete osseous lesions visualized. There are bilateral pelvic phleboliths. Degenerative changes of the bilateral hip joints. Overlying bowel gas limits the ability to assess the sacrum. Redemonstrated lower lumbar spine predominant degenerative change with grade 1 anterolisthesis of L4 on L5, severe facet degenerative changes at L4-L5 and L5-S1. IMPRESSION: 1. No acute radiographic abnormality of the lumbar spine. 2. Redemonstrated lower lumbar spine predominant degenerative change with grade 1 anterolisthesis of L4 on L5. Electronically Signed   By: Marin Roberts M.D.   On: 12/06/2021 16:51     ASSESSMENT/PLAN:  This  is a very pleasant 71 year old African-American male  diagnosed with stage IIIa/C (T1c, N2-N3, M0) non-small cell lung cancer, squamous cell carcinoma.  He presented with a right upper lobe lung nodule in addition to subcarinal lymphadenopathy and suspicious AP window lymph node involvement.  He was diagnosed in May 2022.  Also diagnosed with prostate adenocarcinoma with a Gleason score of 9.  He was diagnosed in April 2022.   The patient completed concurrent chemoradiation with carboplatin for an AUC of 2 and paclitaxel 45 mg per metered squared.  He is status post 7 cycles and tolerated it well.    The patient completed consolidation  immunotherapy with Imfinzi 1500 mg IV every 4 weeks.  He is status post 13 cycles and tolerated it well.  Patient recently had a restaging CT scan performed.  Dr. Julien Nordmann personally independently reviewed the scan discussed results with the patient today.  The scan showed no evidence of disease progression.    Dr. Julien Nordmann recommends that the patient continue on observation with a restaging CT scan of the chest in 3 months. I will order this without contrast due to his CKD.  We will see him back for follow-up visit at that time for evaluation and repeat blood work and to review his scan results in 3 months.   The patient was advised to call immediately if she has any concerning symptoms in the interval. The patient voices understanding of current disease status and treatment options and is in agreement with the current care plan. All questions were answered. The patient knows to call the clinic with any problems, questions or concerns. We can certainly see the patient much sooner if necessary     Orders Placed This Encounter  Procedures   CT Chest Wo Contrast    Standing Status:   Future    Standing Expiration Date:   12/31/2022    Order Specific Question:   Preferred imaging location?    Answer:   [facility]   CBC with Differential ([facility] Only)    Standing Status:   Future    Standing Expiration Date:   01/01/2023   CMP ([facility] only)    Standing Status:   Future    Standing Expiration Date:   01/01/2023      Tobe Sos Indea Dearman, PA-C 12/31/21  ADDENDUM: Hematology/Oncology Attending: I had a face-to-face encounter with the patient today.  I reviewed his record, lab, scan and recommended his care plan.  This is a very pleasant 71 years old African-American male with history of stage IIIc non-small cell lung cancer, squamous cell carcinoma diagnosed in May 2022 status post a course of concurrent chemoradiation followed by consolidation treatment with  immunotherapy with Imfinzi 1500 Mg IV every 4 weeks.  He has been in observation since that time and he is feeling well. He had repeat CT scan of the chest performed recently.  I personally and independently reviewed the scan and discussed the result with the patient today. His scan showed no concerning findings for disease progression. I recommended for him to continue on observation with repeat CT scan of the chest in 3 months. For the renal insufficiency he will continue his Xtandi evaluation by his nephrologist. The patient was advised to call immediately if he has any other concerning symptoms in the interval. Disclaimer: This note was dictated with voice recognition software. Similar sounding words can inadvertently be transcribed and may be missed upon review.

## 2021-12-31 ENCOUNTER — Inpatient Hospital Stay: Payer: Medicare PPO | Attending: Physician Assistant | Admitting: Physician Assistant

## 2021-12-31 VITALS — BP 122/82 | HR 105 | Temp 98.1°F | Resp 17 | Wt 209.3 lb

## 2021-12-31 DIAGNOSIS — R590 Localized enlarged lymph nodes: Secondary | ICD-10-CM | POA: Diagnosis not present

## 2021-12-31 DIAGNOSIS — M47816 Spondylosis without myelopathy or radiculopathy, lumbar region: Secondary | ICD-10-CM | POA: Diagnosis not present

## 2021-12-31 DIAGNOSIS — N183 Chronic kidney disease, stage 3 unspecified: Secondary | ICD-10-CM | POA: Diagnosis not present

## 2021-12-31 DIAGNOSIS — I700 Atherosclerosis of aorta: Secondary | ICD-10-CM | POA: Diagnosis not present

## 2021-12-31 DIAGNOSIS — C61 Malignant neoplasm of prostate: Secondary | ICD-10-CM | POA: Insufficient documentation

## 2021-12-31 DIAGNOSIS — C3411 Malignant neoplasm of upper lobe, right bronchus or lung: Secondary | ICD-10-CM | POA: Insufficient documentation

## 2021-12-31 DIAGNOSIS — C3491 Malignant neoplasm of unspecified part of right bronchus or lung: Secondary | ICD-10-CM | POA: Diagnosis not present

## 2021-12-31 DIAGNOSIS — Z923 Personal history of irradiation: Secondary | ICD-10-CM | POA: Diagnosis not present

## 2021-12-31 DIAGNOSIS — M4316 Spondylolisthesis, lumbar region: Secondary | ICD-10-CM | POA: Diagnosis not present

## 2021-12-31 DIAGNOSIS — M4317 Spondylolisthesis, lumbosacral region: Secondary | ICD-10-CM | POA: Insufficient documentation

## 2021-12-31 DIAGNOSIS — J479 Bronchiectasis, uncomplicated: Secondary | ICD-10-CM | POA: Insufficient documentation

## 2021-12-31 DIAGNOSIS — J439 Emphysema, unspecified: Secondary | ICD-10-CM | POA: Diagnosis not present

## 2021-12-31 DIAGNOSIS — Z79899 Other long term (current) drug therapy: Secondary | ICD-10-CM | POA: Insufficient documentation

## 2021-12-31 DIAGNOSIS — M47817 Spondylosis without myelopathy or radiculopathy, lumbosacral region: Secondary | ICD-10-CM | POA: Insufficient documentation

## 2022-01-02 ENCOUNTER — Other Ambulatory Visit: Payer: Self-pay

## 2022-01-04 ENCOUNTER — Other Ambulatory Visit: Payer: Self-pay

## 2022-01-18 ENCOUNTER — Other Ambulatory Visit (HOSPITAL_COMMUNITY): Payer: Medicare PPO

## 2022-01-18 ENCOUNTER — Ambulatory Visit (HOSPITAL_COMMUNITY): Admission: RE | Admit: 2022-01-18 | Payer: Medicare PPO | Source: Ambulatory Visit

## 2022-01-23 DIAGNOSIS — H1045 Other chronic allergic conjunctivitis: Secondary | ICD-10-CM | POA: Diagnosis not present

## 2022-01-23 DIAGNOSIS — J3089 Other allergic rhinitis: Secondary | ICD-10-CM | POA: Diagnosis not present

## 2022-03-07 DIAGNOSIS — C3491 Malignant neoplasm of unspecified part of right bronchus or lung: Secondary | ICD-10-CM | POA: Diagnosis not present

## 2022-03-07 DIAGNOSIS — E7800 Pure hypercholesterolemia, unspecified: Secondary | ICD-10-CM | POA: Diagnosis not present

## 2022-03-07 DIAGNOSIS — I700 Atherosclerosis of aorta: Secondary | ICD-10-CM | POA: Diagnosis not present

## 2022-03-07 DIAGNOSIS — Z0001 Encounter for general adult medical examination with abnormal findings: Secondary | ICD-10-CM | POA: Diagnosis not present

## 2022-03-07 DIAGNOSIS — N1832 Chronic kidney disease, stage 3b: Secondary | ICD-10-CM | POA: Diagnosis not present

## 2022-03-07 DIAGNOSIS — J439 Emphysema, unspecified: Secondary | ICD-10-CM | POA: Diagnosis not present

## 2022-03-07 DIAGNOSIS — C771 Secondary and unspecified malignant neoplasm of intrathoracic lymph nodes: Secondary | ICD-10-CM | POA: Diagnosis not present

## 2022-03-07 DIAGNOSIS — C61 Malignant neoplasm of prostate: Secondary | ICD-10-CM | POA: Diagnosis not present

## 2022-03-07 DIAGNOSIS — I10 Essential (primary) hypertension: Secondary | ICD-10-CM | POA: Diagnosis not present

## 2022-03-13 ENCOUNTER — Other Ambulatory Visit: Payer: Self-pay

## 2022-03-22 ENCOUNTER — Telehealth: Payer: Self-pay | Admitting: Internal Medicine

## 2022-03-22 NOTE — Telephone Encounter (Signed)
Called patient regarding upcoming March appointments, patient is notified.

## 2022-04-01 ENCOUNTER — Encounter (HOSPITAL_COMMUNITY): Payer: Self-pay

## 2022-04-01 ENCOUNTER — Ambulatory Visit (HOSPITAL_COMMUNITY)
Admission: RE | Admit: 2022-04-01 | Discharge: 2022-04-01 | Disposition: A | Discharge status: Home / Self Care | Payer: Medicare PPO | Source: Ambulatory Visit | Attending: Physician Assistant | Admitting: Physician Assistant

## 2022-04-01 ENCOUNTER — Other Ambulatory Visit: Payer: Self-pay

## 2022-04-01 ENCOUNTER — Inpatient Hospital Stay: Payer: Medicare PPO | Attending: Physician Assistant

## 2022-04-01 ENCOUNTER — Encounter: Payer: Self-pay | Admitting: Internal Medicine

## 2022-04-01 ENCOUNTER — Inpatient Hospital Stay (HOSPITAL_BASED_OUTPATIENT_CLINIC_OR_DEPARTMENT_OTHER): Payer: Medicare PPO | Admitting: Internal Medicine

## 2022-04-01 VITALS — BP 122/71 | HR 89 | Temp 97.7°F | Resp 16 | Wt 208.4 lb

## 2022-04-01 DIAGNOSIS — C3491 Malignant neoplasm of unspecified part of right bronchus or lung: Secondary | ICD-10-CM

## 2022-04-01 DIAGNOSIS — J439 Emphysema, unspecified: Secondary | ICD-10-CM | POA: Diagnosis not present

## 2022-04-01 DIAGNOSIS — C3490 Malignant neoplasm of unspecified part of unspecified bronchus or lung: Secondary | ICD-10-CM | POA: Diagnosis not present

## 2022-04-01 DIAGNOSIS — C61 Malignant neoplasm of prostate: Secondary | ICD-10-CM | POA: Insufficient documentation

## 2022-04-01 DIAGNOSIS — Z923 Personal history of irradiation: Secondary | ICD-10-CM | POA: Diagnosis not present

## 2022-04-01 DIAGNOSIS — C3411 Malignant neoplasm of upper lobe, right bronchus or lung: Secondary | ICD-10-CM | POA: Diagnosis not present

## 2022-04-01 DIAGNOSIS — Z79899 Other long term (current) drug therapy: Secondary | ICD-10-CM | POA: Diagnosis not present

## 2022-04-01 DIAGNOSIS — J479 Bronchiectasis, uncomplicated: Secondary | ICD-10-CM | POA: Diagnosis not present

## 2022-04-01 LAB — CBC WITH DIFFERENTIAL (CANCER CENTER ONLY)
Abs Immature Granulocytes: 0.01 10*3/uL (ref 0.00–0.07)
Basophils Absolute: 0 10*3/uL (ref 0.0–0.1)
Basophils Relative: 1 %
Eosinophils Absolute: 0.4 10*3/uL (ref 0.0–0.5)
Eosinophils Relative: 9 %
HCT: 33.7 % — ABNORMAL LOW (ref 39.0–52.0)
Hemoglobin: 11 g/dL — ABNORMAL LOW (ref 13.0–17.0)
Immature Granulocytes: 0 %
Lymphocytes Relative: 31 %
Lymphs Abs: 1.3 10*3/uL (ref 0.7–4.0)
MCH: 29.6 pg (ref 26.0–34.0)
MCHC: 32.6 g/dL (ref 30.0–36.0)
MCV: 90.6 fL (ref 80.0–100.0)
Monocytes Absolute: 0.6 10*3/uL (ref 0.1–1.0)
Monocytes Relative: 15 %
Neutro Abs: 1.8 10*3/uL (ref 1.7–7.7)
Neutrophils Relative %: 44 %
Platelet Count: 208 10*3/uL (ref 150–400)
RBC: 3.72 MIL/uL — ABNORMAL LOW (ref 4.22–5.81)
RDW: 15.1 % (ref 11.5–15.5)
WBC Count: 4.2 10*3/uL (ref 4.0–10.5)
nRBC: 0 % (ref 0.0–0.2)

## 2022-04-01 LAB — CMP (CANCER CENTER ONLY)
ALT: 11 U/L (ref 0–44)
AST: 18 U/L (ref 15–41)
Albumin: 4.1 g/dL (ref 3.5–5.0)
Alkaline Phosphatase: 118 U/L (ref 38–126)
Anion gap: 8 (ref 5–15)
BUN: 29 mg/dL — ABNORMAL HIGH (ref 8–23)
CO2: 20 mmol/L — ABNORMAL LOW (ref 22–32)
Calcium: 9.1 mg/dL (ref 8.9–10.3)
Chloride: 113 mmol/L — ABNORMAL HIGH (ref 98–111)
Creatinine: 1.72 mg/dL — ABNORMAL HIGH (ref 0.61–1.24)
GFR, Estimated: 42 mL/min — ABNORMAL LOW (ref >60)
Glucose, Bld: 95 mg/dL (ref 70–99)
Potassium: 4.2 mmol/L (ref 3.5–5.1)
Sodium: 141 mmol/L (ref 135–145)
Total Bilirubin: 0.4 mg/dL (ref 0.3–1.2)
Total Protein: 7.2 g/dL (ref 6.5–8.1)

## 2022-04-01 NOTE — Progress Notes (Signed)
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   PRIOR THERAPY: 1) Concurrent chemoradiation with weekly carboplatin for AUC of 2 and paclitaxel 45 Mg/M2. Status post 7 cycles. First dose on 07/04/20 and last cycle was giving August 14, 2020 with partial response.  2) Consolidation treatment with immunotherapy with Imfinzi 1500 Mg IV every 4 weeks. Last dose 08/23/21. Status post 13 cycles.   CURRENT THERAPY: Observation  INTERVAL HISTORY: Jason [name]. 72 y.o. male returns to the clinic today for 3 months follow-up visit.  The patient is feeling fine today with no concerning complaints.  He denied having any chest pain, shortness of breath, cough or hemoptysis.  He has no nausea, vomiting, diarrhea or constipation.  He has no headache or visual changes.  He has no recent weight loss or night sweats.  He is here today for evaluation with repeat CT scan of the chest for restaging of his disease.  MEDICAL HISTORY: Past Medical History:  Diagnosis Date   Arthritis    back   Hypertension    Prostate cancer ([place])    Tobacco use     ALLERGIES:  has No Known Allergies.  MEDICATIONS:  Current Outpatient Medications  Medication Sig Dispense Refill   acetaminophen (TYLENOL) 325 MG tablet Take 650 mg by mouth every 6 (six) hours as needed.     atorvastatin (LIPITOR) 10 MG tablet Take 10 mg by mouth daily.     azelastine (ASTELIN) 0.1 % nasal spray Place 1 spray into both nostrils 2 (two) times daily as needed for rhinitis. Use in each nostril as directed      fluticasone (FLONASE) 50 MCG/ACT nasal spray Place 2 sprays into both nostrils daily.     levocetirizine (XYZAL) 5 MG tablet Take 5 mg by mouth every evening.     methocarbamol (ROBAXIN) 500 MG tablet Take 1 tablet (500 mg total) by mouth 2 (two) times daily. 20 tablet 0   montelukast (SINGULAIR) 10 MG tablet Take 10 mg by mouth at bedtime.     oxyCODONE-acetaminophen (PERCOCET/ROXICET) 5-325 MG tablet Take 1 tablet by mouth every 6 (six) hours as needed for severe pain. 15 tablet 0   valsartan-hydrochlorothiazide (DIOVAN-HCT) 320-25 MG tablet Take 1 tablet by mouth daily.     venlafaxine (EFFEXOR) 25 MG tablet 1 tablet with food     No current facility-administered medications for this visit.    SURGICAL HISTORY:  Past Surgical History:  Procedure Laterality Date   BACK SURGERY     BRONCHIAL BIOPSY  06/06/2020   Procedure: BRONCHIAL BIOPSIES;  Surgeon: Garner Nash, DO;  Location: [facility];  Service: Pulmonary;;   BRONCHIAL BRUSHINGS  06/06/2020   Procedure: BRONCHIAL BRUSHINGS;  Surgeon: Garner Nash, DO;  Location: [facility];  Service: Pulmonary;;   BRONCHIAL NEEDLE ASPIRATION BIOPSY  06/06/2020   Procedure: BRONCHIAL NEEDLE ASPIRATION BIOPSIES;  Surgeon: Garner Nash, DO;  Location: [facility];  Service: Pulmonary;;   BRONCHIAL WASHINGS  06/06/2020   Procedure: BRONCHIAL WASHINGS;  Surgeon: Garner Nash, DO;  Location: [facility];  Service: Pulmonary;;  COLONOSCOPY     OTHER SURGICAL HISTORY     fluid drawn off his testicle   PROSTATE BIOPSY     ROTATOR CUFF REPAIR Right    VIDEO BRONCHOSCOPY WITH ENDOBRONCHIAL NAVIGATION Right 06/06/2020   Procedure: VIDEO BRONCHOSCOPY WITH ENDOBRONCHIAL NAVIGATION;  Surgeon: Garner Nash, DO;  Location: [facility];  Service: Pulmonary;  Laterality: Right;   VIDEO BRONCHOSCOPY WITH ENDOBRONCHIAL ULTRASOUND Bilateral 06/06/2020   Procedure: VIDEO BRONCHOSCOPY WITH ENDOBRONCHIAL ULTRASOUND;  Surgeon: Garner Nash, DO;   Location: [facility];  Service: Pulmonary;  Laterality: Bilateral;   WRIST SURGERY      REVIEW OF SYSTEMS:  A comprehensive review of systems was negative.   PHYSICAL EXAMINATION: General appearance: alert, cooperative, fatigued, and no distress Head: Normocephalic, without obvious abnormality, atraumatic Neck: no adenopathy, no JVD, supple, symmetrical, trachea midline, and thyroid not enlarged, symmetric, no tenderness/mass/nodules Lymph nodes: Cervical, supraclavicular, and axillary nodes normal. Resp: clear to auscultation bilaterally Back: symmetric, no curvature. ROM normal. No CVA tenderness. Cardio: regular rate and rhythm, S1, S2 normal, no murmur, click, rub or gallop GI: soft, non-tender; bowel sounds normal; no masses,  no organomegaly Extremities: extremities normal, atraumatic, no cyanosis or edema  ECOG PERFORMANCE STATUS: 1 - Symptomatic but completely ambulatory  Blood pressure 122/71, pulse 89, temperature 97.7 F (36.5 C), resp. rate 16, weight 208 lb 6.4 oz (94.5 kg).  LABORATORY DATA: Lab Results  Component Value Date   WBC 4.2 04/01/2022   HGB 11.0 (L) 04/01/2022   HCT 33.7 (L) 04/01/2022   MCV 90.6 04/01/2022   PLT 208 04/01/2022      Chemistry      Component Value Date/Time   NA 141 04/01/2022 0825   K 4.2 04/01/2022 0825   CL 113 (H) 04/01/2022 0825   CO2 20 (L) 04/01/2022 0825   BUN 29 (H) 04/01/2022 0825   CREATININE 1.72 (H) 04/01/2022 0825      Component Value Date/Time   CALCIUM 9.1 04/01/2022 0825   ALKPHOS 118 04/01/2022 0825   AST 18 04/01/2022 0825   ALT 11 04/01/2022 0825   BILITOT 0.4 04/01/2022 0825       RADIOGRAPHIC STUDIES: No results found.  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  The patient is status post a course of concurrent chemoradiation with weekly carboplatin and paclitaxel for 7 cycles with partial response followed by 1 year treatment with consolidation immunotherapy with Imfinzi 1500 Mg IV every 4 weeks completed on August 23, 2021. The patient has been in observation since that time and he is feeling fine. He had repeat CT scan of the chest performed earlier today.  I personally and independently reviewed the scan images and discussed the result with the patient today.  The final report is still pending.  I did not see any clear evidence for disease progression on my review but I would wait for the final report for confirmation. If the scan showed no concerning findings, I will see him back for follow-up visit in 4 months for evaluation with repeat CT scan of the chest for restaging of his disease. 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 The patient was advised to call immediately if he has any concerning symptoms in the interval.  The patient voices understanding of current disease status and treatment options and is in agreement with the current care plan.  All questions were answered. The patient knows to call the clinic with any problems, questions or concerns. We can certainly see the patient much sooner if necessary.  The total time spent in the appointment was 20 minutes.  Disclaimer: This note was dictated with voice recognition software. Similar sounding words can inadvertently be transcribed and may not be corrected upon review.

## 2022-04-30 ENCOUNTER — Other Ambulatory Visit: Payer: Self-pay

## 2022-06-07 ENCOUNTER — Other Ambulatory Visit: Payer: Self-pay

## 2022-06-21 ENCOUNTER — Telehealth: Payer: Self-pay | Admitting: Internal Medicine

## 2022-06-21 NOTE — Telephone Encounter (Signed)
Rescheduled 07/10 to 07/10 due to provider on-call, left a voicemail.

## 2022-06-27 ENCOUNTER — Telehealth: Payer: Self-pay | Admitting: Internal Medicine

## 2022-06-27 NOTE — Telephone Encounter (Signed)
Called patient regarding July appointments, left a voicemail.

## 2022-08-04 NOTE — Progress Notes (Unsigned)
[facility] OFFICE PROGRESS NOTE  Koirala, [name], MD 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  2) Prostate adenocarcinoma with Gleason score of 06 May 2020  PRIOR THERAPY:  1) Concurrent chemoradiation with weekly carboplatin for AUC of 2 and paclitaxel 45 Mg/M2. Status post 7 cycles. First dose on 07/04/20 and last cycle was giving August 14, 2020 with partial response.  2) Consolidation treatment with immunotherapy with Imfinzi 1500 Mg IV every 4 weeks. Last dose 08/23/21. Status post 13 cycles.    CURRENT THERAPY: Observation   INTERVAL HISTORY: Jason [name]. 72 y.o. male returns  to the clinic today for a 53-month follow-up visit. The patient is feeling well today without any concerning complaints except the heat/weather.  He has been gaining weight recently.  He has had history of back surgery.  He tries to walk 2 to 3 miles but it exacerbates his arthritis.  He takes Tylenol and uses Voltaren cream.  Therefore, he has started to gain weight.  He is followed for his history of lung cancer, for which he previously underwent treatment with concurrent chemoRT followed by 1 year of consolidation immunotherapy. He is currently on observation and feeling well. He denies any fever, chills, or night sweats. He sometimes gets hot flashes related to his injection for his prostate cancer.  He follows closely with nephrology for his current chronic kidney disease; however, he states he does not have any upcoming appointments. He states they wanted to see him every 6 months but since he was doing well, he was wondering if he really needs to follow with them. He denies any chest pain, cough, or hemoptysis. He mentions that his wife tells  him she hears rattling in his breathing at night when he is asleep and laying flat.  Denies any nausea, vomiting, diarrhea, or constipation.  Denies any headache or visual changes.  Denies any rashes or skin changes.  The patient recently had a restaging CT scan performed.  He is here today for evaluation to review his scan results.   MEDICAL HISTORY: Past Medical History:  Diagnosis Date   Arthritis    back   Hypertension    Prostate cancer (HCC)    Tobacco use     ALLERGIES:  has No Known Allergies.  MEDICATIONS:  Current Outpatient Medications  Medication Sig Dispense Refill   atorvastatin (LIPITOR) 10 MG tablet Take 10 mg by mouth daily.     azelastine (ASTELIN) 0.1 % nasal spray Place 1 spray into both nostrils 2 (two) times daily as needed for rhinitis. Use in each nostril as directed     fluticasone (FLONASE) 50 MCG/ACT nasal spray Place 2 sprays into both nostrils daily.     levocetirizine (XYZAL) 5 MG tablet Take 5 mg by mouth every evening.     methocarbamol (ROBAXIN) 500 MG tablet Take 1 tablet (500 mg total) by mouth 2 (two) times daily. 20 tablet 0   montelukast (SINGULAIR) 10 MG tablet Take 10 mg by mouth at bedtime.     valsartan-hydrochlorothiazide (DIOVAN-HCT) 320-25 MG tablet Take 1 tablet by mouth daily.     venlafaxine (EFFEXOR) 25 MG tablet 1 tablet with food     acetaminophen (TYLENOL) 325 MG tablet Take 650 mg by mouth every  6 (six) hours as needed. (Patient not taking: Reported on 08/07/2022)     oxyCODONE-acetaminophen (PERCOCET/ROXICET) 5-325 MG tablet Take 1 tablet by mouth every 6 (six) hours as needed for severe pain. 15 tablet 0   No current facility-administered medications for this visit.    SURGICAL HISTORY:  Past Surgical History:  Procedure Laterality Date   BACK SURGERY     BRONCHIAL BIOPSY  06/06/2020   Procedure: BRONCHIAL BIOPSIES;  Surgeon: Josephine Igo, DO;  Location: [facility];  Service: Pulmonary;;   BRONCHIAL BRUSHINGS  06/06/2020    Procedure: BRONCHIAL BRUSHINGS;  Surgeon: Josephine Igo, DO;  Location: [facility];  Service: Pulmonary;;   BRONCHIAL NEEDLE ASPIRATION BIOPSY  06/06/2020   Procedure: BRONCHIAL NEEDLE ASPIRATION BIOPSIES;  Surgeon: Josephine Igo, DO;  Location: [facility];  Service: Pulmonary;;   BRONCHIAL WASHINGS  06/06/2020   Procedure: BRONCHIAL WASHINGS;  Surgeon: Josephine Igo, DO;  Location: [facility];  Service: Pulmonary;;   COLONOSCOPY     OTHER SURGICAL HISTORY     fluid drawn off his testicle   PROSTATE BIOPSY     ROTATOR CUFF REPAIR Right    VIDEO BRONCHOSCOPY WITH ENDOBRONCHIAL NAVIGATION Right 06/06/2020   Procedure: VIDEO BRONCHOSCOPY WITH ENDOBRONCHIAL NAVIGATION;  Surgeon: Josephine Igo, DO;  Location: [facility];  Service: Pulmonary;  Laterality: Right;   VIDEO BRONCHOSCOPY WITH ENDOBRONCHIAL ULTRASOUND Bilateral 06/06/2020   Procedure: VIDEO BRONCHOSCOPY WITH ENDOBRONCHIAL ULTRASOUND;  Surgeon: Josephine Igo, DO;  Location: [facility];  Service: Pulmonary;  Laterality: Bilateral;   WRIST SURGERY      REVIEW OF SYSTEMS:   Constitutional: Positive for weight gain. Negative for appetite change, chills, fatigue, and fever.  HENT: Negative for mouth sores, nosebleeds, sore throat and trouble swallowing.  Eyes: Negative for eye problems and icterus.  Respiratory: Negative for cough, hemoptysis, shortness of breath and wheezing.   Cardiovascular: Negative for chest pain and leg swelling.  Gastrointestinal: Negative for abdominal pain, constipation, diarrhea, nausea and vomiting.  Genitourinary: Negative for bladder incontinence, difficulty urinating, dysuria, frequency and hematuria.   Musculoskeletal: Positive for arthritis in his back. Negative for gait problem, neck pain and neck stiffness.  Skin: Negative for itching and rash.  Neurological: Negative for dizziness, extremity weakness, gait problem, headaches, light-headedness and seizures.  Hematological: Negative  for adenopathy. Does not bruise/bleed easily.  Psychiatric/Behavioral: Negative for confusion, depression and sleep disturbance. The patient is not nervous/anxious.   PHYSICAL EXAMINATION:  Blood pressure 123/73, pulse 89, temperature 98.1 F (36.7 C), temperature source Oral, resp. rate 16, weight 214 lb 14.4 oz (97.5 kg), SpO2 97 %.  ECOG PERFORMANCE STATUS: 1  Physical Exam  Constitutional: Oriented to person, place, and time and well-developed, well-nourished, and in no distress.  HENT:  Head: Normocephalic and atraumatic.  Mouth/Throat: Oropharynx is clear and moist. No oropharyngeal exudate.  Eyes: Conjunctivae are normal. Right eye exhibits no discharge. Left eye exhibits no discharge. No scleral icterus.  Neck: Normal range of motion. Neck supple.  Cardiovascular: Normal rate, regular rhythm, normal heart sounds and intact distal pulses.   Pulmonary/Chest: Effort normal and breath sounds normal. No respiratory distress. No wheezes. No rales.  Abdominal: Soft. Bowel sounds are normal. Exhibits no distension and no mass. There is no tenderness.  Musculoskeletal: Normal range of motion. Exhibits no edema.  Lymphadenopathy:    No cervical adenopathy.  Neurological: Alert and oriented to person, place, and time. Exhibits normal muscle tone. Gait normal. Coordination normal.  Skin: Skin is warm  and dry. No rash noted. Not diaphoretic. No erythema. No pallor.  Psychiatric: Mood, memory and judgment normal.  Vitals reviewed  LABORATORY DATA: Lab Results  Component Value Date   WBC 5.2 08/05/2022   HGB 12.0 (L) 08/05/2022   HCT 38.0 (L) 08/05/2022   MCV 90.0 08/05/2022   PLT 240 08/05/2022      Chemistry      Component Value Date/Time   NA 143 08/05/2022 1517   K 3.9 08/05/2022 1517   CL 110 08/05/2022 1517   CO2 23 08/05/2022 1517   BUN 35 (H) 08/05/2022 1517   CREATININE 2.03 (H) 08/05/2022 1517      Component Value Date/Time   CALCIUM 9.6 08/05/2022 1517   ALKPHOS  112 08/05/2022 1517   AST 15 08/05/2022 1517   ALT 11 08/05/2022 1517   BILITOT 0.4 08/05/2022 1517       RADIOGRAPHIC STUDIES:  No results found.   ASSESSMENT/PLAN:  This is a very pleasant 72 year old African-American male  diagnosed with stage IIIa/C (T1c, N2-N3, M0) non-small cell lung cancer, squamous cell carcinoma.  He presented with a right upper lobe lung nodule in addition to subcarinal lymphadenopathy and suspicious AP window lymph node involvement.  He was diagnosed in May 2022. He was diagnosed with prostate adenocarcinoma with a Gleason score of 9.  He was diagnosed in April 2022.   The patient completed concurrent chemoradiation with carboplatin for an AUC of 2 and paclitaxel 45 mg per metered squared.  He is status post 7 cycles and tolerated it well.    The patient completed consolidation immunotherapy with Imfinzi 1500 mg IV every 4 weeks.  He is status post 13 cycles and tolerated it well.   Patient recently had a restaging CT scan performed.  Dr. Arbutus Ped personally independently reviewed the scan discussed results with the patient today.  The final radiology over read is not available at this time, however Dr. Arbutus Ped did not see any obvious signs of disease progression.  I will call the patient with the final radiology report results later today.   There is no evidence of disease progression on his final scan results, then we will see the patient back for a follow-up visit with a restaging CT scan of the chest in 4 months.  If his next scan also is stable, then we may consider seeing him every 6 months.  I will order his scans without contrast due to his chronic kidney disease.  The patient and I discussed exercises that he may tolerate better with his arthritis.  Discussed swimming and pool exercises and stationary bike which may help not put as much pressure on his back.     We will see him back for follow-up visit at that time for evaluation and repeat blood work and  to review his scan results in 4 months.   The patient was advised to call immediately if she has any concerning symptoms in the interval. The patient voices understanding of current disease status and treatment options and is in agreement with the current care plan. All questions were answered. The patient knows to call the clinic with any problems, questions or concerns. We can certainly see the patient much sooner if necessary    Orders Placed This Encounter  Procedures   CT Chest Wo Contrast    Standing Status:   Future    Standing Expiration Date:   08/07/2023    Order Specific Question:   Preferred imaging location?  Answer:   [facility]   CBC with Differential (Cancer Center Only)    Standing Status:   Future    Standing Expiration Date:   08/07/2023   CMP (Cancer Center only)    Standing Status:   Future    Standing Expiration Date:   08/07/2023       Jason Abraham Caius Silbernagel, PA-C 08/07/22  ADDENDUM: Hematology/Oncology Attending: I had a face-to-face encounter with the patient today.  I reviewed his record, lab, scan and recommended his care plan.  This is a very pleasant 72 years old African-American male with history of a stage IIIc non-small cell lung cancer, squamous cell carcinoma diagnosed in May 2022 status post a course of concurrent chemoradiation followed by 1 year of consolidation treatment with immunotherapy with Imfinzi completed on August 23, 2021 and the patient has been on observation since that time. He had repeat CT scan of the chest performed recently.  I personally and independently reviewed the scan and discussed results with the patient today. His scan showed no concerning findings for disease progression. I recommended for him to continue on observation with repeat CT scan of the chest in 4 months. The patient was advised to call immediately if he has any other concerning symptoms in the interval. The total time spent in the appointment was  20 minutes. Disclaimer: This note was dictated with voice recognition software. Similar sounding words can inadvertently be transcribed and may be missed upon review. Lajuana Matte, MD

## 2022-08-05 ENCOUNTER — Other Ambulatory Visit: Payer: Self-pay

## 2022-08-05 ENCOUNTER — Inpatient Hospital Stay: Payer: Medicare PPO | Attending: Physician Assistant

## 2022-08-05 ENCOUNTER — Ambulatory Visit (HOSPITAL_COMMUNITY)
Admission: RE | Admit: 2022-08-05 | Discharge: 2022-08-05 | Disposition: A | Discharge status: Home / Self Care | Payer: Medicare PPO | Source: Ambulatory Visit | Attending: Internal Medicine | Admitting: Internal Medicine

## 2022-08-05 DIAGNOSIS — Z79899 Other long term (current) drug therapy: Secondary | ICD-10-CM | POA: Insufficient documentation

## 2022-08-05 DIAGNOSIS — Z923 Personal history of irradiation: Secondary | ICD-10-CM | POA: Insufficient documentation

## 2022-08-05 DIAGNOSIS — C3490 Malignant neoplasm of unspecified part of unspecified bronchus or lung: Secondary | ICD-10-CM | POA: Insufficient documentation

## 2022-08-05 DIAGNOSIS — R635 Abnormal weight gain: Secondary | ICD-10-CM | POA: Insufficient documentation

## 2022-08-05 DIAGNOSIS — R232 Flushing: Secondary | ICD-10-CM | POA: Insufficient documentation

## 2022-08-05 DIAGNOSIS — C3411 Malignant neoplasm of upper lobe, right bronchus or lung: Secondary | ICD-10-CM | POA: Insufficient documentation

## 2022-08-05 DIAGNOSIS — R591 Generalized enlarged lymph nodes: Secondary | ICD-10-CM | POA: Insufficient documentation

## 2022-08-05 DIAGNOSIS — C61 Malignant neoplasm of prostate: Secondary | ICD-10-CM | POA: Insufficient documentation

## 2022-08-05 DIAGNOSIS — D3502 Benign neoplasm of left adrenal gland: Secondary | ICD-10-CM | POA: Diagnosis not present

## 2022-08-05 DIAGNOSIS — J479 Bronchiectasis, uncomplicated: Secondary | ICD-10-CM | POA: Diagnosis not present

## 2022-08-05 DIAGNOSIS — M1990 Unspecified osteoarthritis, unspecified site: Secondary | ICD-10-CM | POA: Insufficient documentation

## 2022-08-05 DIAGNOSIS — I700 Atherosclerosis of aorta: Secondary | ICD-10-CM | POA: Diagnosis not present

## 2022-08-05 DIAGNOSIS — N183 Chronic kidney disease, stage 3 unspecified: Secondary | ICD-10-CM | POA: Insufficient documentation

## 2022-08-05 LAB — CMP (CANCER CENTER ONLY)
ALT: 11 U/L (ref 0–44)
AST: 15 U/L (ref 15–41)
Albumin: 4 g/dL (ref 3.5–5.0)
Alkaline Phosphatase: 112 U/L (ref 38–126)
Anion gap: 10 (ref 5–15)
BUN: 35 mg/dL — ABNORMAL HIGH (ref 8–23)
CO2: 23 mmol/L (ref 22–32)
Calcium: 9.6 mg/dL (ref 8.9–10.3)
Chloride: 110 mmol/L (ref 98–111)
Creatinine: 2.03 mg/dL — ABNORMAL HIGH (ref 0.61–1.24)
GFR, Estimated: 34 mL/min — ABNORMAL LOW (ref >60)
Glucose, Bld: 100 mg/dL — ABNORMAL HIGH (ref 70–99)
Potassium: 3.9 mmol/L (ref 3.5–5.1)
Sodium: 143 mmol/L (ref 135–145)
Total Bilirubin: 0.4 mg/dL (ref 0.3–1.2)
Total Protein: 7.5 g/dL (ref 6.5–8.1)

## 2022-08-05 LAB — CBC WITH DIFFERENTIAL (CANCER CENTER ONLY)
Abs Immature Granulocytes: 0.01 10*3/uL (ref 0.00–0.07)
Basophils Absolute: 0 10*3/uL (ref 0.0–0.1)
Basophils Relative: 1 %
Eosinophils Absolute: 0.3 10*3/uL (ref 0.0–0.5)
Eosinophils Relative: 6 %
HCT: 38 % — ABNORMAL LOW (ref 39.0–52.0)
Hemoglobin: 12 g/dL — ABNORMAL LOW (ref 13.0–17.0)
Immature Granulocytes: 0 %
Lymphocytes Relative: 24 %
Lymphs Abs: 1.2 10*3/uL (ref 0.7–4.0)
MCH: 28.4 pg (ref 26.0–34.0)
MCHC: 31.6 g/dL (ref 30.0–36.0)
MCV: 90 fL (ref 80.0–100.0)
Monocytes Absolute: 0.4 10*3/uL (ref 0.1–1.0)
Monocytes Relative: 8 %
Neutro Abs: 3.2 10*3/uL (ref 1.7–7.7)
Neutrophils Relative %: 61 %
Platelet Count: 240 10*3/uL (ref 150–400)
RBC: 4.22 MIL/uL (ref 4.22–5.81)
RDW: 15 % (ref 11.5–15.5)
WBC Count: 5.2 10*3/uL (ref 4.0–10.5)
nRBC: 0 % (ref 0.0–0.2)

## 2022-08-07 ENCOUNTER — Other Ambulatory Visit: Payer: Self-pay

## 2022-08-07 ENCOUNTER — Inpatient Hospital Stay: Payer: Medicare PPO | Admitting: Physician Assistant

## 2022-08-07 ENCOUNTER — Telehealth: Payer: Self-pay | Admitting: Physician Assistant

## 2022-08-07 ENCOUNTER — Ambulatory Visit: Payer: Medicare PPO | Admitting: Internal Medicine

## 2022-08-07 VITALS — BP 123/73 | HR 89 | Temp 98.1°F | Resp 16 | Wt 214.9 lb

## 2022-08-07 DIAGNOSIS — Z923 Personal history of irradiation: Secondary | ICD-10-CM | POA: Diagnosis not present

## 2022-08-07 DIAGNOSIS — R591 Generalized enlarged lymph nodes: Secondary | ICD-10-CM | POA: Diagnosis not present

## 2022-08-07 DIAGNOSIS — C3411 Malignant neoplasm of upper lobe, right bronchus or lung: Secondary | ICD-10-CM | POA: Diagnosis not present

## 2022-08-07 DIAGNOSIS — M1990 Unspecified osteoarthritis, unspecified site: Secondary | ICD-10-CM | POA: Diagnosis not present

## 2022-08-07 DIAGNOSIS — Z79899 Other long term (current) drug therapy: Secondary | ICD-10-CM | POA: Diagnosis not present

## 2022-08-07 DIAGNOSIS — C3491 Malignant neoplasm of unspecified part of right bronchus or lung: Secondary | ICD-10-CM | POA: Diagnosis not present

## 2022-08-07 DIAGNOSIS — C61 Malignant neoplasm of prostate: Secondary | ICD-10-CM | POA: Diagnosis not present

## 2022-08-07 DIAGNOSIS — R635 Abnormal weight gain: Secondary | ICD-10-CM | POA: Diagnosis not present

## 2022-08-07 DIAGNOSIS — R232 Flushing: Secondary | ICD-10-CM | POA: Diagnosis not present

## 2022-08-07 DIAGNOSIS — N183 Chronic kidney disease, stage 3 unspecified: Secondary | ICD-10-CM | POA: Diagnosis not present

## 2022-08-07 NOTE — Telephone Encounter (Signed)
I called the patient with the final radiology report for his recent scan.  Reviewed with Dr. Arbutus Ped.  No concerns for progressive disease.  Dr. Arbutus Ped recommends the patient continue on observation with a repeat scan of the chest in 4 months.  We will see him back a few days after his scan to review the results.  He expressed understanding.

## 2022-08-08 ENCOUNTER — Other Ambulatory Visit: Payer: Self-pay

## 2022-08-08 ENCOUNTER — Telehealth: Payer: Self-pay | Admitting: Medical Oncology

## 2022-08-08 NOTE — Telephone Encounter (Signed)
I returned wife's call . LVM to return my call.

## 2022-08-09 NOTE — Telephone Encounter (Signed)
Wife wants pt to lose weight .    Per Cassie "The patient and I discussed exercises that he may tolerate better with his arthritis.  Discussed swimming and pool exercises and stationary bike which may help not put as much pressure on his back" .

## 2022-08-19 ENCOUNTER — Telehealth: Payer: Self-pay | Admitting: Medical Oncology

## 2022-08-19 ENCOUNTER — Encounter: Payer: Self-pay | Admitting: Internal Medicine

## 2022-08-19 NOTE — Telephone Encounter (Signed)
Wife  concerned that pt is gaining weight-concerned pt may get diabetes. I suggested he talk to PCP.

## 2022-09-09 DIAGNOSIS — C771 Secondary and unspecified malignant neoplasm of intrathoracic lymph nodes: Secondary | ICD-10-CM | POA: Diagnosis not present

## 2022-09-09 DIAGNOSIS — C3491 Malignant neoplasm of unspecified part of right bronchus or lung: Secondary | ICD-10-CM | POA: Diagnosis not present

## 2022-09-09 DIAGNOSIS — I10 Essential (primary) hypertension: Secondary | ICD-10-CM | POA: Diagnosis not present

## 2022-09-09 DIAGNOSIS — N1832 Chronic kidney disease, stage 3b: Secondary | ICD-10-CM | POA: Diagnosis not present

## 2022-09-09 DIAGNOSIS — E7800 Pure hypercholesterolemia, unspecified: Secondary | ICD-10-CM | POA: Diagnosis not present

## 2022-09-09 DIAGNOSIS — C61 Malignant neoplasm of prostate: Secondary | ICD-10-CM | POA: Diagnosis not present

## 2022-09-09 DIAGNOSIS — I2510 Atherosclerotic heart disease of native coronary artery without angina pectoris: Secondary | ICD-10-CM | POA: Diagnosis not present

## 2022-12-05 ENCOUNTER — Ambulatory Visit (HOSPITAL_COMMUNITY)
Admission: RE | Admit: 2022-12-05 | Discharge: 2022-12-05 | Disposition: A | Discharge status: Home / Self Care | Payer: Medicare PPO | Source: Ambulatory Visit | Attending: Physician Assistant | Admitting: Physician Assistant

## 2022-12-05 ENCOUNTER — Inpatient Hospital Stay: Payer: Medicare PPO | Attending: Internal Medicine

## 2022-12-05 DIAGNOSIS — Z9221 Personal history of antineoplastic chemotherapy: Secondary | ICD-10-CM | POA: Diagnosis not present

## 2022-12-05 DIAGNOSIS — C3491 Malignant neoplasm of unspecified part of right bronchus or lung: Secondary | ICD-10-CM | POA: Insufficient documentation

## 2022-12-05 DIAGNOSIS — Z79899 Other long term (current) drug therapy: Secondary | ICD-10-CM | POA: Diagnosis not present

## 2022-12-05 DIAGNOSIS — Z87891 Personal history of nicotine dependence: Secondary | ICD-10-CM | POA: Diagnosis not present

## 2022-12-05 DIAGNOSIS — C3411 Malignant neoplasm of upper lobe, right bronchus or lung: Secondary | ICD-10-CM | POA: Insufficient documentation

## 2022-12-05 DIAGNOSIS — C61 Malignant neoplasm of prostate: Secondary | ICD-10-CM | POA: Diagnosis not present

## 2022-12-05 DIAGNOSIS — Z923 Personal history of irradiation: Secondary | ICD-10-CM | POA: Insufficient documentation

## 2022-12-05 DIAGNOSIS — D3502 Benign neoplasm of left adrenal gland: Secondary | ICD-10-CM | POA: Insufficient documentation

## 2022-12-05 DIAGNOSIS — C3490 Malignant neoplasm of unspecified part of unspecified bronchus or lung: Secondary | ICD-10-CM | POA: Diagnosis not present

## 2022-12-05 DIAGNOSIS — Z9226 Personal history of immune checkpoint inhibitor therapy: Secondary | ICD-10-CM | POA: Diagnosis not present

## 2022-12-05 DIAGNOSIS — I700 Atherosclerosis of aorta: Secondary | ICD-10-CM | POA: Diagnosis not present

## 2022-12-05 DIAGNOSIS — I2510 Atherosclerotic heart disease of native coronary artery without angina pectoris: Secondary | ICD-10-CM | POA: Insufficient documentation

## 2022-12-05 DIAGNOSIS — J432 Centrilobular emphysema: Secondary | ICD-10-CM | POA: Diagnosis not present

## 2022-12-05 DIAGNOSIS — J8410 Pulmonary fibrosis, unspecified: Secondary | ICD-10-CM | POA: Diagnosis not present

## 2022-12-05 LAB — CBC WITH DIFFERENTIAL (CANCER CENTER ONLY)
Abs Immature Granulocytes: 0 10*3/uL (ref 0.00–0.07)
Basophils Absolute: 0 10*3/uL (ref 0.0–0.1)
Basophils Relative: 1 %
Eosinophils Absolute: 0.3 10*3/uL (ref 0.0–0.5)
Eosinophils Relative: 7 %
HCT: 37.5 % — ABNORMAL LOW (ref 39.0–52.0)
Hemoglobin: 12.2 g/dL — ABNORMAL LOW (ref 13.0–17.0)
Immature Granulocytes: 0 %
Lymphocytes Relative: 32 %
Lymphs Abs: 1.4 10*3/uL (ref 0.7–4.0)
MCH: 28.8 pg (ref 26.0–34.0)
MCHC: 32.5 g/dL (ref 30.0–36.0)
MCV: 88.4 fL (ref 80.0–100.0)
Monocytes Absolute: 0.5 10*3/uL (ref 0.1–1.0)
Monocytes Relative: 12 %
Neutro Abs: 2 10*3/uL (ref 1.7–7.7)
Neutrophils Relative %: 48 %
Platelet Count: 216 10*3/uL (ref 150–400)
RBC: 4.24 MIL/uL (ref 4.22–5.81)
RDW: 15.8 % — ABNORMAL HIGH (ref 11.5–15.5)
WBC Count: 4.3 10*3/uL (ref 4.0–10.5)
nRBC: 0 % (ref 0.0–0.2)

## 2022-12-05 LAB — CMP (CANCER CENTER ONLY)
ALT: 9 U/L (ref 0–44)
AST: 12 U/L — ABNORMAL LOW (ref 15–41)
Albumin: 4.1 g/dL (ref 3.5–5.0)
Alkaline Phosphatase: 87 U/L (ref 38–126)
Anion gap: 8 (ref 5–15)
BUN: 28 mg/dL — ABNORMAL HIGH (ref 8–23)
CO2: 24 mmol/L (ref 22–32)
Calcium: 9.6 mg/dL (ref 8.9–10.3)
Chloride: 111 mmol/L (ref 98–111)
Creatinine: 1.64 mg/dL — ABNORMAL HIGH (ref 0.61–1.24)
GFR, Estimated: 44 mL/min — ABNORMAL LOW (ref >60)
Glucose, Bld: 92 mg/dL (ref 70–99)
Potassium: 4.4 mmol/L (ref 3.5–5.1)
Sodium: 143 mmol/L (ref 135–145)
Total Bilirubin: 0.6 mg/dL (ref <1.2)
Total Protein: 7.3 g/dL (ref 6.5–8.1)

## 2022-12-06 ENCOUNTER — Other Ambulatory Visit: Payer: Self-pay

## 2022-12-10 ENCOUNTER — Inpatient Hospital Stay: Payer: Medicare PPO | Admitting: Internal Medicine

## 2022-12-17 ENCOUNTER — Inpatient Hospital Stay: Payer: Medicare PPO | Admitting: Internal Medicine

## 2022-12-17 VITALS — BP 115/84 | HR 88 | Temp 97.7°F | Resp 18 | Ht 70.0 in | Wt 209.9 lb

## 2022-12-17 DIAGNOSIS — C3490 Malignant neoplasm of unspecified part of unspecified bronchus or lung: Secondary | ICD-10-CM | POA: Diagnosis not present

## 2022-12-17 DIAGNOSIS — D3502 Benign neoplasm of left adrenal gland: Secondary | ICD-10-CM | POA: Diagnosis not present

## 2022-12-17 DIAGNOSIS — I700 Atherosclerosis of aorta: Secondary | ICD-10-CM | POA: Diagnosis not present

## 2022-12-17 DIAGNOSIS — C61 Malignant neoplasm of prostate: Secondary | ICD-10-CM | POA: Diagnosis not present

## 2022-12-17 DIAGNOSIS — Z87891 Personal history of nicotine dependence: Secondary | ICD-10-CM | POA: Diagnosis not present

## 2022-12-17 DIAGNOSIS — Z9221 Personal history of antineoplastic chemotherapy: Secondary | ICD-10-CM | POA: Diagnosis not present

## 2022-12-17 DIAGNOSIS — C3411 Malignant neoplasm of upper lobe, right bronchus or lung: Secondary | ICD-10-CM | POA: Diagnosis not present

## 2022-12-17 DIAGNOSIS — I2510 Atherosclerotic heart disease of native coronary artery without angina pectoris: Secondary | ICD-10-CM | POA: Diagnosis not present

## 2022-12-17 DIAGNOSIS — J432 Centrilobular emphysema: Secondary | ICD-10-CM | POA: Diagnosis not present

## 2022-12-17 DIAGNOSIS — Z79899 Other long term (current) drug therapy: Secondary | ICD-10-CM | POA: Diagnosis not present

## 2022-12-17 NOTE — Progress Notes (Signed)
[facility] [phone]   [phone]  OFFICE PROGRESS NOTE  Koirala, [name], MD 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   PRIOR THERAPY: 1) Concurrent chemoradiation with weekly carboplatin for AUC of 2 and paclitaxel 45 Mg/M2. Status post 7 cycles. First dose on 07/04/20 and last cycle was giving August 14, 2020 with partial response.  2) Consolidation treatment with immunotherapy with Imfinzi 1500 Mg IV every 4 weeks. Last dose 08/23/21. Status post 13 cycles.   CURRENT THERAPY: Observation  INTERVAL HISTORY: Jason [name]. 72 y.o. male returns to the clinic today for follow-up visit.Discussed the use of AI scribe software for clinical note transcription with the patient, who gave verbal consent to proceed.  History of Present Illness   The patient, a 71 year old individual with a history of stage 3 non-small cell lung cancer and prostate cancer, presents for a follow-up visit. He was diagnosed with both conditions in 2022. For the lung cancer, he underwent chemotherapy, radiation, and a year-long course of immunotherapy with Imfinzi, which was completed in July 2023. Concurrently, he received hormonal therapy, and radiation for the prostate cancer.  Over the past four months, the patient reports no new complaints or symptoms. He denies experiencing chest pain, breathing issues, or cough. He also denies any gastrointestinal symptoms such as nausea, vomiting, or diarrhea. He has noticed a slight weight loss of approximately five pounds since July. The patient's overall condition appears stable with no new or worsening symptoms since the  last visit.       MEDICAL HISTORY: Past Medical History:  Diagnosis Date   Arthritis    back   Hypertension    Prostate cancer (HCC)    Tobacco use     ALLERGIES:  has No Known Allergies.  MEDICATIONS:  Current Outpatient Medications  Medication Sig Dispense Refill   acetaminophen (TYLENOL) 325 MG tablet Take 650 mg by mouth every 6 (six) hours as needed. (Patient not taking: Reported on 08/07/2022)     atorvastatin (LIPITOR) 10 MG tablet Take 10 mg by mouth daily.     azelastine (ASTELIN) 0.1 % nasal spray Place 1 spray into both nostrils 2 (two) times daily as needed for rhinitis. Use in each nostril as directed     fluticasone (FLONASE) 50 MCG/ACT nasal spray Place 2 sprays into both nostrils daily.     levocetirizine (XYZAL) 5 MG tablet Take 5 mg by mouth every evening.     methocarbamol (ROBAXIN) 500 MG tablet Take 1 tablet (500 mg total) by mouth 2 (two) times daily. 20 tablet 0   montelukast (SINGULAIR) 10 MG tablet Take 10 mg by mouth at bedtime.     oxyCODONE-acetaminophen (PERCOCET/ROXICET) 5-325 MG tablet Take 1 tablet by mouth every 6 (six) hours as needed for severe pain. 15 tablet 0   valsartan-hydrochlorothiazide (DIOVAN-HCT) 320-25 MG tablet Take 1 tablet by mouth daily.     venlafaxine (EFFEXOR) 25 MG tablet 1 tablet with food     No current facility-administered medications for this visit.    SURGICAL HISTORY:  Past Surgical History:  Procedure Laterality Date   BACK SURGERY     BRONCHIAL BIOPSY  06/06/2020  Procedure: BRONCHIAL BIOPSIES;  Surgeon: Josephine Igo, DO;  Location: [facility];  Service: Pulmonary;;   BRONCHIAL BRUSHINGS  06/06/2020   Procedure: BRONCHIAL BRUSHINGS;  Surgeon: Josephine Igo, DO;  Location: [facility];  Service: Pulmonary;;   BRONCHIAL NEEDLE ASPIRATION BIOPSY  06/06/2020   Procedure: BRONCHIAL NEEDLE ASPIRATION BIOPSIES;  Surgeon: Josephine Igo, DO;  Location: [facility];  Service: Pulmonary;;   BRONCHIAL WASHINGS   06/06/2020   Procedure: BRONCHIAL WASHINGS;  Surgeon: Josephine Igo, DO;  Location: [facility];  Service: Pulmonary;;   COLONOSCOPY     OTHER SURGICAL HISTORY     fluid drawn off his testicle   PROSTATE BIOPSY     ROTATOR CUFF REPAIR Right    VIDEO BRONCHOSCOPY WITH ENDOBRONCHIAL NAVIGATION Right 06/06/2020   Procedure: VIDEO BRONCHOSCOPY WITH ENDOBRONCHIAL NAVIGATION;  Surgeon: Josephine Igo, DO;  Location: [facility];  Service: Pulmonary;  Laterality: Right;   VIDEO BRONCHOSCOPY WITH ENDOBRONCHIAL ULTRASOUND Bilateral 06/06/2020   Procedure: VIDEO BRONCHOSCOPY WITH ENDOBRONCHIAL ULTRASOUND;  Surgeon: Josephine Igo, DO;  Location: [facility];  Service: Pulmonary;  Laterality: Bilateral;   WRIST SURGERY      REVIEW OF SYSTEMS:  A comprehensive review of systems was negative.   PHYSICAL EXAMINATION: General appearance: alert, cooperative, and no distress Head: Normocephalic, without obvious abnormality, atraumatic Neck: no adenopathy, no JVD, supple, symmetrical, trachea midline, and thyroid not enlarged, symmetric, no tenderness/mass/nodules Lymph nodes: Cervical, supraclavicular, and axillary nodes normal. Resp: clear to auscultation bilaterally Back: symmetric, no curvature. ROM normal. No CVA tenderness. Cardio: regular rate and rhythm, S1, S2 normal, no murmur, click, rub or gallop GI: soft, non-tender; bowel sounds normal; no masses,  no organomegaly Extremities: extremities normal, atraumatic, no cyanosis or edema  ECOG PERFORMANCE STATUS: 1 - Symptomatic but completely ambulatory  Blood pressure 115/84, pulse 88, temperature 97.7 F (36.5 C), temperature source Temporal, resp. rate 18, height 5\' 10"  (1.778 m), weight 209 lb 14.4 oz (95.2 kg), SpO2 98%.  LABORATORY DATA: Lab Results  Component Value Date   WBC 4.3 12/05/2022   HGB 12.2 (L) 12/05/2022   HCT 37.5 (L) 12/05/2022   MCV 88.4 12/05/2022   PLT 216 12/05/2022      Chemistry      Component Value  Date/Time   NA 143 12/05/2022 0911   K 4.4 12/05/2022 0911   CL 111 12/05/2022 0911   CO2 24 12/05/2022 0911   BUN 28 (H) 12/05/2022 0911   CREATININE 1.64 (H) 12/05/2022 0911      Component Value Date/Time   CALCIUM 9.6 12/05/2022 0911   ALKPHOS 87 12/05/2022 0911   AST 12 (L) 12/05/2022 0911   ALT 9 12/05/2022 0911   BILITOT 0.6 12/05/2022 0911       RADIOGRAPHIC STUDIES: CT Chest Wo Contrast  Result Date: 12/17/2022 CLINICAL DATA:  Non-small-cell lung cancer. Right-sided. Evaluate treatment response. Diagnosed in 2022. Prostate cancer in 2022. Prior chemotherapy and radiation therapy. Ex-smoker. * Tracking Code: BO * EXAM: CT CHEST WITHOUT CONTRAST TECHNIQUE: Multidetector CT imaging of the chest was performed following the standard protocol without IV contrast. RADIATION DOSE REDUCTION: This exam was performed according to the departmental dose-optimization program which includes automated exposure control, adjustment of the mA and/or kV according to patient size and/or use of iterative reconstruction technique. COMPARISON:  08/05/2022 FINDINGS: Cardiovascular: Tortuous thoracic aorta. Aortic atherosclerosis. Mild cardiomegaly, without pericardial effusion. Lad coronary artery calcification. Mediastinum/Nodes: Prominent AP window node of 7 mm is unchanged, not pathologic by size  criteria. Hilar regions poorly evaluated without intravenous contrast. Lungs/Pleura: No pleural fluid.  Moderate centrilobular emphysema. Again identified is bilateral lower lobe cylindrical bronchiectasis, presumably post infectious/inflammatory. Posterior right upper lobe/apical architectural distortion and volume loss are similar, likely radiation induced. The previously measured area of underlying nodularity is estimated at 7 x 6 mm on 24/5 today, similar. Upper Abdomen: Normal imaged portions of the liver, spleen, stomach, pancreas, gallbladder,. Mild renal cortical thinning bilaterally. Mild right adrenal  thickening. Lateral limb left adrenal gland 1.5 cm nodule measures -1 HU, consistent with an adenoma. Musculoskeletal: Cervical spine fixation. No acute osseous abnormality. IMPRESSION: 1. Similar appearance of posterior right upper lobe/apical radiation fibrosis. No recurrent or metastatic disease identified. 2. Aortic atherosclerosis (ICD10-I70.0), coronary artery atherosclerosis and emphysema (ICD10-J43.9). 3. Left adrenal adenoma . In the absence of clinically indicated signs/symptoms require(s) no independent follow-up. Electronically Signed   By: Jeronimo Greaves M.D.   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  The patient is status post a course of concurrent chemoradiation with weekly carboplatin and paclitaxel for 7 cycles with partial response followed by 1 year treatment with consolidation immunotherapy with Imfinzi 1500 Mg IV every 4 weeks completed on August 23, 2021.  The patient is currently on observation and he is feeling fine. He had repeat CT scan of the chest performed recently.  I personally and independently reviewed the scan and discussed the result with the patient today. His scan showed no concerning findings for disease progression.    Non-Small Cell Lung Cancer (NSCLC) Stage III NSCLC diagnosed May 2022, treated with chemotherapy, radiation, and one year of Imfinzi (completed July 2023). Current scan shows no concerning findings. No new symptoms reported. Slight weight loss noted (215 lbs in July to 209.9 lbs). - Order lab and scan before next visit - Schedule follow-up in six months  Prostate Cancer Prostate cancer diagnosed April 2022, treated with chemotherapy, hormonal therapy, and  radiation. No new symptoms reported. - Continue current management and monitoring.  The patient was advised to call immediately if he has any concerning symptoms in the interval.  The patient voices understanding of current disease status and treatment options and is in agreement with the current care plan.  All questions were answered. The patient knows to call the clinic with any problems, questions or concerns. We can certainly see the patient much sooner if necessary.  The total time spent in the appointment was 20 minutes.  Disclaimer: This note was dictated with voice recognition software. Similar sounding words can inadvertently be transcribed and may not be corrected upon review.

## 2022-12-20 ENCOUNTER — Other Ambulatory Visit: Payer: Self-pay

## 2023-01-24 DIAGNOSIS — H1045 Other chronic allergic conjunctivitis: Secondary | ICD-10-CM | POA: Diagnosis not present

## 2023-01-24 DIAGNOSIS — J3089 Other allergic rhinitis: Secondary | ICD-10-CM | POA: Diagnosis not present

## 2023-03-19 DIAGNOSIS — C61 Malignant neoplasm of prostate: Secondary | ICD-10-CM | POA: Diagnosis not present

## 2023-03-19 DIAGNOSIS — I2510 Atherosclerotic heart disease of native coronary artery without angina pectoris: Secondary | ICD-10-CM | POA: Diagnosis not present

## 2023-03-19 DIAGNOSIS — C771 Secondary and unspecified malignant neoplasm of intrathoracic lymph nodes: Secondary | ICD-10-CM | POA: Diagnosis not present

## 2023-03-19 DIAGNOSIS — R0602 Shortness of breath: Secondary | ICD-10-CM | POA: Diagnosis not present

## 2023-03-19 DIAGNOSIS — N1832 Chronic kidney disease, stage 3b: Secondary | ICD-10-CM | POA: Diagnosis not present

## 2023-03-19 DIAGNOSIS — Z1331 Encounter for screening for depression: Secondary | ICD-10-CM | POA: Diagnosis not present

## 2023-03-19 DIAGNOSIS — I10 Essential (primary) hypertension: Secondary | ICD-10-CM | POA: Diagnosis not present

## 2023-03-19 DIAGNOSIS — Z0001 Encounter for general adult medical examination with abnormal findings: Secondary | ICD-10-CM | POA: Diagnosis not present

## 2023-03-19 DIAGNOSIS — E7800 Pure hypercholesterolemia, unspecified: Secondary | ICD-10-CM | POA: Diagnosis not present

## 2023-03-19 DIAGNOSIS — C3491 Malignant neoplasm of unspecified part of right bronchus or lung: Secondary | ICD-10-CM | POA: Diagnosis not present

## 2023-03-21 ENCOUNTER — Other Ambulatory Visit: Payer: Self-pay | Admitting: Family Medicine

## 2023-03-21 DIAGNOSIS — M5431 Sciatica, right side: Secondary | ICD-10-CM

## 2023-04-06 ENCOUNTER — Ambulatory Visit
Admission: RE | Admit: 2023-04-06 | Discharge: 2023-04-06 | Disposition: A | Discharge status: Home / Self Care | Payer: Medicare PPO | Source: Ambulatory Visit | Attending: Family Medicine | Admitting: Family Medicine

## 2023-04-06 DIAGNOSIS — M5431 Sciatica, right side: Secondary | ICD-10-CM

## 2023-04-06 DIAGNOSIS — M4726 Other spondylosis with radiculopathy, lumbar region: Secondary | ICD-10-CM | POA: Diagnosis not present

## 2023-04-06 DIAGNOSIS — M5126 Other intervertebral disc displacement, lumbar region: Secondary | ICD-10-CM | POA: Diagnosis not present

## 2023-04-06 DIAGNOSIS — M48061 Spinal stenosis, lumbar region without neurogenic claudication: Secondary | ICD-10-CM | POA: Diagnosis not present

## 2023-05-21 DIAGNOSIS — M4807 Spinal stenosis, lumbosacral region: Secondary | ICD-10-CM | POA: Diagnosis not present

## 2023-05-21 DIAGNOSIS — N1832 Chronic kidney disease, stage 3b: Secondary | ICD-10-CM | POA: Diagnosis not present

## 2023-05-21 DIAGNOSIS — M51369 Other intervertebral disc degeneration, lumbar region without mention of lumbar back pain or lower extremity pain: Secondary | ICD-10-CM | POA: Diagnosis not present

## 2023-06-09 ENCOUNTER — Ambulatory Visit (HOSPITAL_COMMUNITY)
Admission: RE | Admit: 2023-06-09 | Discharge: 2023-06-09 | Disposition: A | Discharge status: Home / Self Care | Source: Ambulatory Visit | Attending: Internal Medicine | Admitting: Internal Medicine

## 2023-06-09 ENCOUNTER — Inpatient Hospital Stay: Attending: Internal Medicine

## 2023-06-09 ENCOUNTER — Other Ambulatory Visit: Payer: Medicare PPO

## 2023-06-09 DIAGNOSIS — I129 Hypertensive chronic kidney disease with stage 1 through stage 4 chronic kidney disease, or unspecified chronic kidney disease: Secondary | ICD-10-CM | POA: Insufficient documentation

## 2023-06-09 DIAGNOSIS — Z79899 Other long term (current) drug therapy: Secondary | ICD-10-CM | POA: Insufficient documentation

## 2023-06-09 DIAGNOSIS — Z923 Personal history of irradiation: Secondary | ICD-10-CM | POA: Insufficient documentation

## 2023-06-09 DIAGNOSIS — Z9221 Personal history of antineoplastic chemotherapy: Secondary | ICD-10-CM | POA: Insufficient documentation

## 2023-06-09 DIAGNOSIS — J439 Emphysema, unspecified: Secondary | ICD-10-CM | POA: Diagnosis not present

## 2023-06-09 DIAGNOSIS — I2510 Atherosclerotic heart disease of native coronary artery without angina pectoris: Secondary | ICD-10-CM | POA: Insufficient documentation

## 2023-06-09 DIAGNOSIS — I700 Atherosclerosis of aorta: Secondary | ICD-10-CM | POA: Insufficient documentation

## 2023-06-09 DIAGNOSIS — J8410 Pulmonary fibrosis, unspecified: Secondary | ICD-10-CM | POA: Insufficient documentation

## 2023-06-09 DIAGNOSIS — J479 Bronchiectasis, uncomplicated: Secondary | ICD-10-CM | POA: Insufficient documentation

## 2023-06-09 DIAGNOSIS — I2721 Secondary pulmonary arterial hypertension: Secondary | ICD-10-CM | POA: Insufficient documentation

## 2023-06-09 DIAGNOSIS — N189 Chronic kidney disease, unspecified: Secondary | ICD-10-CM | POA: Insufficient documentation

## 2023-06-09 DIAGNOSIS — C3490 Malignant neoplasm of unspecified part of unspecified bronchus or lung: Secondary | ICD-10-CM | POA: Diagnosis not present

## 2023-06-09 DIAGNOSIS — Z9226 Personal history of immune checkpoint inhibitor therapy: Secondary | ICD-10-CM | POA: Diagnosis not present

## 2023-06-09 DIAGNOSIS — C3411 Malignant neoplasm of upper lobe, right bronchus or lung: Secondary | ICD-10-CM | POA: Insufficient documentation

## 2023-06-09 LAB — CBC WITH DIFFERENTIAL (CANCER CENTER ONLY)
Abs Immature Granulocytes: 0.01 10*3/uL (ref 0.00–0.07)
Basophils Absolute: 0 10*3/uL (ref 0.0–0.1)
Basophils Relative: 1 %
Eosinophils Absolute: 0.4 10*3/uL (ref 0.0–0.5)
Eosinophils Relative: 9 %
HCT: 37.2 % — ABNORMAL LOW (ref 39.0–52.0)
Hemoglobin: 12.3 g/dL — ABNORMAL LOW (ref 13.0–17.0)
Immature Granulocytes: 0 %
Lymphocytes Relative: 30 %
Lymphs Abs: 1.3 10*3/uL (ref 0.7–4.0)
MCH: 29.4 pg (ref 26.0–34.0)
MCHC: 33.1 g/dL (ref 30.0–36.0)
MCV: 88.8 fL (ref 80.0–100.0)
Monocytes Absolute: 0.6 10*3/uL (ref 0.1–1.0)
Monocytes Relative: 13 %
Neutro Abs: 2 10*3/uL (ref 1.7–7.7)
Neutrophils Relative %: 47 %
Platelet Count: 242 10*3/uL (ref 150–400)
RBC: 4.19 MIL/uL — ABNORMAL LOW (ref 4.22–5.81)
RDW: 15.2 % (ref 11.5–15.5)
WBC Count: 4.2 10*3/uL (ref 4.0–10.5)
nRBC: 0 % (ref 0.0–0.2)

## 2023-06-09 LAB — CMP (CANCER CENTER ONLY)
ALT: 8 U/L (ref 0–44)
AST: 14 U/L — ABNORMAL LOW (ref 15–41)
Albumin: 4.1 g/dL (ref 3.5–5.0)
Alkaline Phosphatase: 83 U/L (ref 38–126)
Anion gap: 6 (ref 5–15)
BUN: 33 mg/dL — ABNORMAL HIGH (ref 8–23)
CO2: 23 mmol/L (ref 22–32)
Calcium: 9.2 mg/dL (ref 8.9–10.3)
Chloride: 107 mmol/L (ref 98–111)
Creatinine: 2.2 mg/dL — ABNORMAL HIGH (ref 0.61–1.24)
GFR, Estimated: 31 mL/min — ABNORMAL LOW (ref >60)
Glucose, Bld: 103 mg/dL — ABNORMAL HIGH (ref 70–99)
Potassium: 4.6 mmol/L (ref 3.5–5.1)
Sodium: 136 mmol/L (ref 135–145)
Total Bilirubin: 0.6 mg/dL (ref 0.0–1.2)
Total Protein: 7.2 g/dL (ref 6.5–8.1)

## 2023-06-17 ENCOUNTER — Inpatient Hospital Stay: Payer: Medicare PPO | Admitting: Internal Medicine

## 2023-06-17 VITALS — BP 129/84 | HR 72 | Temp 98.2°F | Resp 18 | Ht 70.0 in | Wt 216.1 lb

## 2023-06-17 DIAGNOSIS — I2510 Atherosclerotic heart disease of native coronary artery without angina pectoris: Secondary | ICD-10-CM | POA: Diagnosis not present

## 2023-06-17 DIAGNOSIS — J439 Emphysema, unspecified: Secondary | ICD-10-CM | POA: Diagnosis not present

## 2023-06-17 DIAGNOSIS — C3490 Malignant neoplasm of unspecified part of unspecified bronchus or lung: Secondary | ICD-10-CM

## 2023-06-17 DIAGNOSIS — J8410 Pulmonary fibrosis, unspecified: Secondary | ICD-10-CM | POA: Diagnosis not present

## 2023-06-17 DIAGNOSIS — I129 Hypertensive chronic kidney disease with stage 1 through stage 4 chronic kidney disease, or unspecified chronic kidney disease: Secondary | ICD-10-CM | POA: Diagnosis not present

## 2023-06-17 DIAGNOSIS — N189 Chronic kidney disease, unspecified: Secondary | ICD-10-CM | POA: Diagnosis not present

## 2023-06-17 DIAGNOSIS — I2721 Secondary pulmonary arterial hypertension: Secondary | ICD-10-CM | POA: Diagnosis not present

## 2023-06-17 DIAGNOSIS — I700 Atherosclerosis of aorta: Secondary | ICD-10-CM | POA: Diagnosis not present

## 2023-06-17 DIAGNOSIS — J479 Bronchiectasis, uncomplicated: Secondary | ICD-10-CM | POA: Diagnosis not present

## 2023-06-17 DIAGNOSIS — C3411 Malignant neoplasm of upper lobe, right bronchus or lung: Secondary | ICD-10-CM | POA: Diagnosis not present

## 2023-06-17 NOTE — Progress Notes (Signed)
 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   PRIOR THERAPY: 1) Concurrent chemoradiation with weekly carboplatin  for AUC of 2 and paclitaxel  45 Mg/M2. Status post 7 cycles. First dose on 07/04/20 and last cycle was giving August 14, 2020 with partial response.  2) Consolidation treatment with immunotherapy with Imfinzi  1500 Mg IV every 4 weeks. Last dose 08/23/21. Status post 13 cycles.   CURRENT THERAPY: Observation  INTERVAL HISTORY: Jason [name]. 73 y.o. male returns to the clinic today for follow-up visit.Discussed the use of AI scribe software for clinical note transcription with the patient, who gave verbal consent to proceed.  History of Present Illness   Jason [name]. is a 73 year old male with stage three C non-small cell lung cancer who presents for evaluation with repeat blood work and CT scan for restaging of his disease.  Diagnosed with stage three C non-small cell lung cancer, squamous cell carcinoma, in May 2022. Completed concurrent chemoradiation and one year of consolidation treatment with immunotherapy using Imfinzi , last dose in July 2023. Currently under observation.  No new complaints since last visit six months ago. No chest pain, breathing issues, or hemoptysis. Experiences phlegm production, though not constant. No recent weight loss, nausea, vomiting, or diarrhea.  Scheduled to see a nephrologist in June for further evaluation of kidney function decline.        MEDICAL HISTORY: Past Medical History:  Diagnosis Date    Arthritis    back   Hypertension    Prostate cancer (HCC)    Tobacco use     ALLERGIES:  has no known allergies.  MEDICATIONS:  Current Outpatient Medications  Medication Sig Dispense Refill   acetaminophen  (TYLENOL ) 325 MG tablet Take 650 mg by mouth every 6 (six) hours as needed. (Patient not taking: Reported on 08/07/2022)     atorvastatin (LIPITOR) 10 MG tablet Take 10 mg by mouth daily.     azelastine (ASTELIN) 0.1 % nasal spray Place 1 spray into both nostrils 2 (two) times daily as needed for rhinitis. Use in each nostril as directed     fluticasone (FLONASE) 50 MCG/ACT nasal spray Place 2 sprays into both nostrils daily.     levocetirizine (XYZAL) 5 MG tablet Take 5 mg by mouth every evening.     methocarbamol  (ROBAXIN ) 500 MG tablet Take 1 tablet (500 mg total) by mouth 2 (two) times daily. 20 tablet 0   montelukast (SINGULAIR) 10 MG tablet Take 10 mg by mouth at bedtime.     oxyCODONE -acetaminophen  (PERCOCET/ROXICET) 5-325 MG tablet Take 1 tablet by mouth every 6 (six) hours as needed for severe pain. 15 tablet 0   valsartan-hydrochlorothiazide (DIOVAN-HCT) 320-25 MG tablet Take 1 tablet by mouth daily.     venlafaxine (EFFEXOR) 25 MG tablet 1 tablet with food     No current facility-administered medications for this visit.    SURGICAL HISTORY:  Past Surgical History:  Procedure Laterality Date   BACK SURGERY     BRONCHIAL BIOPSY  06/06/2020   Procedure: BRONCHIAL BIOPSIES;  Surgeon: Prudy Brownie, DO;  Location: [facility];  Service: Pulmonary;;   BRONCHIAL BRUSHINGS  06/06/2020   Procedure: BRONCHIAL BRUSHINGS;  Surgeon: Prudy Brownie, DO;  Location: [facility];  Service: Pulmonary;;   BRONCHIAL NEEDLE ASPIRATION BIOPSY  06/06/2020   Procedure: BRONCHIAL NEEDLE ASPIRATION BIOPSIES;  Surgeon: Prudy Brownie, DO;  Location: [facility];  Service: Pulmonary;;   BRONCHIAL WASHINGS  06/06/2020   Procedure: BRONCHIAL WASHINGS;  Surgeon: Prudy Brownie, DO;   Location: [facility];  Service: Pulmonary;;   COLONOSCOPY     OTHER SURGICAL HISTORY     fluid drawn off his testicle   PROSTATE BIOPSY     ROTATOR CUFF REPAIR Right    VIDEO BRONCHOSCOPY WITH ENDOBRONCHIAL NAVIGATION Right 06/06/2020   Procedure: VIDEO BRONCHOSCOPY WITH ENDOBRONCHIAL NAVIGATION;  Surgeon: Prudy Brownie, DO;  Location: [facility];  Service: Pulmonary;  Laterality: Right;   VIDEO BRONCHOSCOPY WITH ENDOBRONCHIAL ULTRASOUND Bilateral 06/06/2020   Procedure: VIDEO BRONCHOSCOPY WITH ENDOBRONCHIAL ULTRASOUND;  Surgeon: Prudy Brownie, DO;  Location: [facility];  Service: Pulmonary;  Laterality: Bilateral;   WRIST SURGERY      REVIEW OF SYSTEMS:  Constitutional: positive for fatigue Eyes: negative Ears, nose, mouth, throat, and face: negative Respiratory: positive for sputum Cardiovascular: negative Gastrointestinal: negative Genitourinary:negative Integument/breast: negative Hematologic/lymphatic: negative Musculoskeletal:negative Neurological: negative Behavioral/Psych: negative Endocrine: negative Allergic/Immunologic: negative   PHYSICAL EXAMINATION: General appearance: alert, cooperative, and no distress Head: Normocephalic, without obvious abnormality, atraumatic Neck: no adenopathy, no JVD, supple, symmetrical, trachea midline, and thyroid  not enlarged, symmetric, no tenderness/mass/nodules Lymph nodes: Cervical, supraclavicular, and axillary nodes normal. Resp: clear to auscultation bilaterally Back: symmetric, no curvature. ROM normal. No CVA tenderness. Cardio: regular rate and rhythm, S1, S2 normal, no murmur, click, rub or gallop GI: soft, non-tender; bowel sounds normal; no masses,  no organomegaly Extremities: extremities normal, atraumatic, no cyanosis or edema Neurologic: Alert and oriented X 3, normal strength and tone. Normal symmetric reflexes. Normal coordination and gait  ECOG PERFORMANCE STATUS: 1 - Symptomatic but completely  ambulatory  Blood pressure 129/84, pulse 72, temperature 98.2 F (36.8 C), temperature source Temporal, resp. rate 18, height 5\' 10"  (1.778 m), weight 216 lb 1.6 oz (98 kg), SpO2 96%.  LABORATORY DATA: Lab Results  Component Value Date   WBC 4.2 06/09/2023   HGB 12.3 (L) 06/09/2023   HCT 37.2 (L) 06/09/2023   MCV 88.8 06/09/2023   PLT 242 06/09/2023      Chemistry      Component Value Date/Time   NA 136 06/09/2023 1202   K 4.6 06/09/2023 1202   CL 107 06/09/2023 1202   CO2 23 06/09/2023 1202   BUN 33 (H) 06/09/2023 1202   CREATININE 2.20 (H) 06/09/2023 1202      Component Value Date/Time   CALCIUM 9.2 06/09/2023 1202   ALKPHOS 83 06/09/2023 1202   AST 14 (L) 06/09/2023 1202   ALT 8 06/09/2023 1202   BILITOT 0.6 06/09/2023 1202       RADIOGRAPHIC STUDIES: CT Chest Wo Contrast Result Date: 06/16/2023 CLINICAL DATA:  Non-small cell lung cancer (NSCLC), staging. * Tracking Code: BO * EXAM: CT CHEST WITHOUT CONTRAST TECHNIQUE: Multidetector CT imaging of the chest was performed following the standard protocol without IV contrast. RADIATION DOSE REDUCTION: This exam was performed according to the departmental dose-optimization program which includes automated exposure control, adjustment of the mA and/or kV according to patient size and/or use of iterative reconstruction technique. COMPARISON:  12/05/2022 FINDINGS: Cardiovascular: Mild coronary artery calcification. Global cardiac size is  within normal limits. No pericardial effusion. Central pulmonary arteries are enlarged in keeping with changes of pulmonary arterial hypertension, stable since prior examination. Minimal atherosclerotic calcification within the thoracic aorta. No aortic aneurysm. Mediastinum/Nodes: Visualized thyroid  is unremarkable. Stable prominent AP window lymph node measuring 12 mm in short axis diameter. No pathologic thoracic adenopathy. Esophagus unremarkable. Lungs/Pleura: Mild emphysema again noted. Post  radiation fibrotic change is seen within the posterior right apex, similar to prior examination, encompassing the index lesion previously noted on 06/02/2020. No evidence of a local recurrent disease. Stable bibasilar architectural distortion and post inflammatory fibrosis with traction bronchiectasis. No new focal pulmonary infiltrates. No pneumothorax or pleural effusion. No central obstructing lesion. Upper Abdomen: No acute abnormality. Musculoskeletal: No acute bone abnormality. No lytic or blastic bone lesion. Osseous structures are age appropriate. IMPRESSION: 1. Stable post radiation fibrotic change within the posterior right apex encompassing the index lesion previously noted on 06/02/2020. No evidence of recurrent or residual disease within the thorax. 2. Mild coronary artery calcification. 3. Stable enlargement of the central pulmonary arteries in keeping with changes of pulmonary arterial hypertension. 4. Stable bibasilar post inflammatory pulmonary fibrotic change. Aortic Atherosclerosis (ICD10-I70.0) and Emphysema (ICD10-J43.9). Electronically Signed   By: Worthy Heads M.D.   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  The patient is status post a course of concurrent chemoradiation with weekly carboplatin  and paclitaxel  for 7 cycles with partial response followed by 1 year treatment with consolidation immunotherapy with Imfinzi  1500 Mg IV every 4 weeks completed on August 23, 2021.  The patient is currently on observation and he is feeling fine. He had repeat CT scan of the chest performed recently.  I personally and independently reviewed the scan and discussed the result with the patient today.  His scan  showed no concerning findings for disease progression.    Stage 3C non-small cell lung cancer Stage 3C non-small cell lung cancer, squamous cell carcinoma, diagnosed in May 2022. Completed concurrent chemoradiation followed by one year of consolidation immunotherapy with Imfinzi , last dose in July 2023. Recent CT scan shows no evidence of disease progression or metastasis. No new symptoms reported, except for occasional phlegm without hemoptysis. No recent weight loss, nausea, vomiting, or diarrhea. - Schedule follow-up appointment in six months with repeat CT scan  Chronic kidney disease, unspecified Chronic kidney disease with serum creatinine elevated to 2.2. He is aware of the condition and is scheduled to see a nephrologist in June for further evaluation and management. - Ensure follow-up with nephrologist in June   He was advised to call immediately if he has any concerning symptoms in the interval. The patient voices understanding of current disease status and treatment options and is in agreement with the current care plan.  All questions were answered. The patient knows to call the clinic with any problems, questions or concerns. We can certainly see the patient much sooner if necessary. The total time spent in the appointment was 30 minutes including review of chart and various tests results, discussions about plan of care and coordination of care plan .   Disclaimer: This note was dictated with voice recognition software. Similar sounding words can inadvertently be transcribed and may not be corrected upon review.

## 2023-06-19 ENCOUNTER — Other Ambulatory Visit: Payer: Self-pay

## 2023-06-30 DIAGNOSIS — M4316 Spondylolisthesis, lumbar region: Secondary | ICD-10-CM | POA: Diagnosis not present

## 2023-06-30 DIAGNOSIS — M48062 Spinal stenosis, lumbar region with neurogenic claudication: Secondary | ICD-10-CM | POA: Diagnosis not present

## 2023-06-30 DIAGNOSIS — Z6830 Body mass index (BMI) 30.0-30.9, adult: Secondary | ICD-10-CM | POA: Diagnosis not present

## 2023-07-08 DIAGNOSIS — M48062 Spinal stenosis, lumbar region with neurogenic claudication: Secondary | ICD-10-CM | POA: Diagnosis not present

## 2023-07-25 IMAGING — CR DG HIP (WITH OR WITHOUT PELVIS) 2-3V*R*
2 series · 2 of 2 positions shown · non-contrast
Comparison: None.

CLINICAL DATA: Chronic right hip pain, no known injury, initial
encounter

EXAM:
DG HIP (WITH OR WITHOUT PELVIS) 2V RIGHT

[t pelvis a.p.]
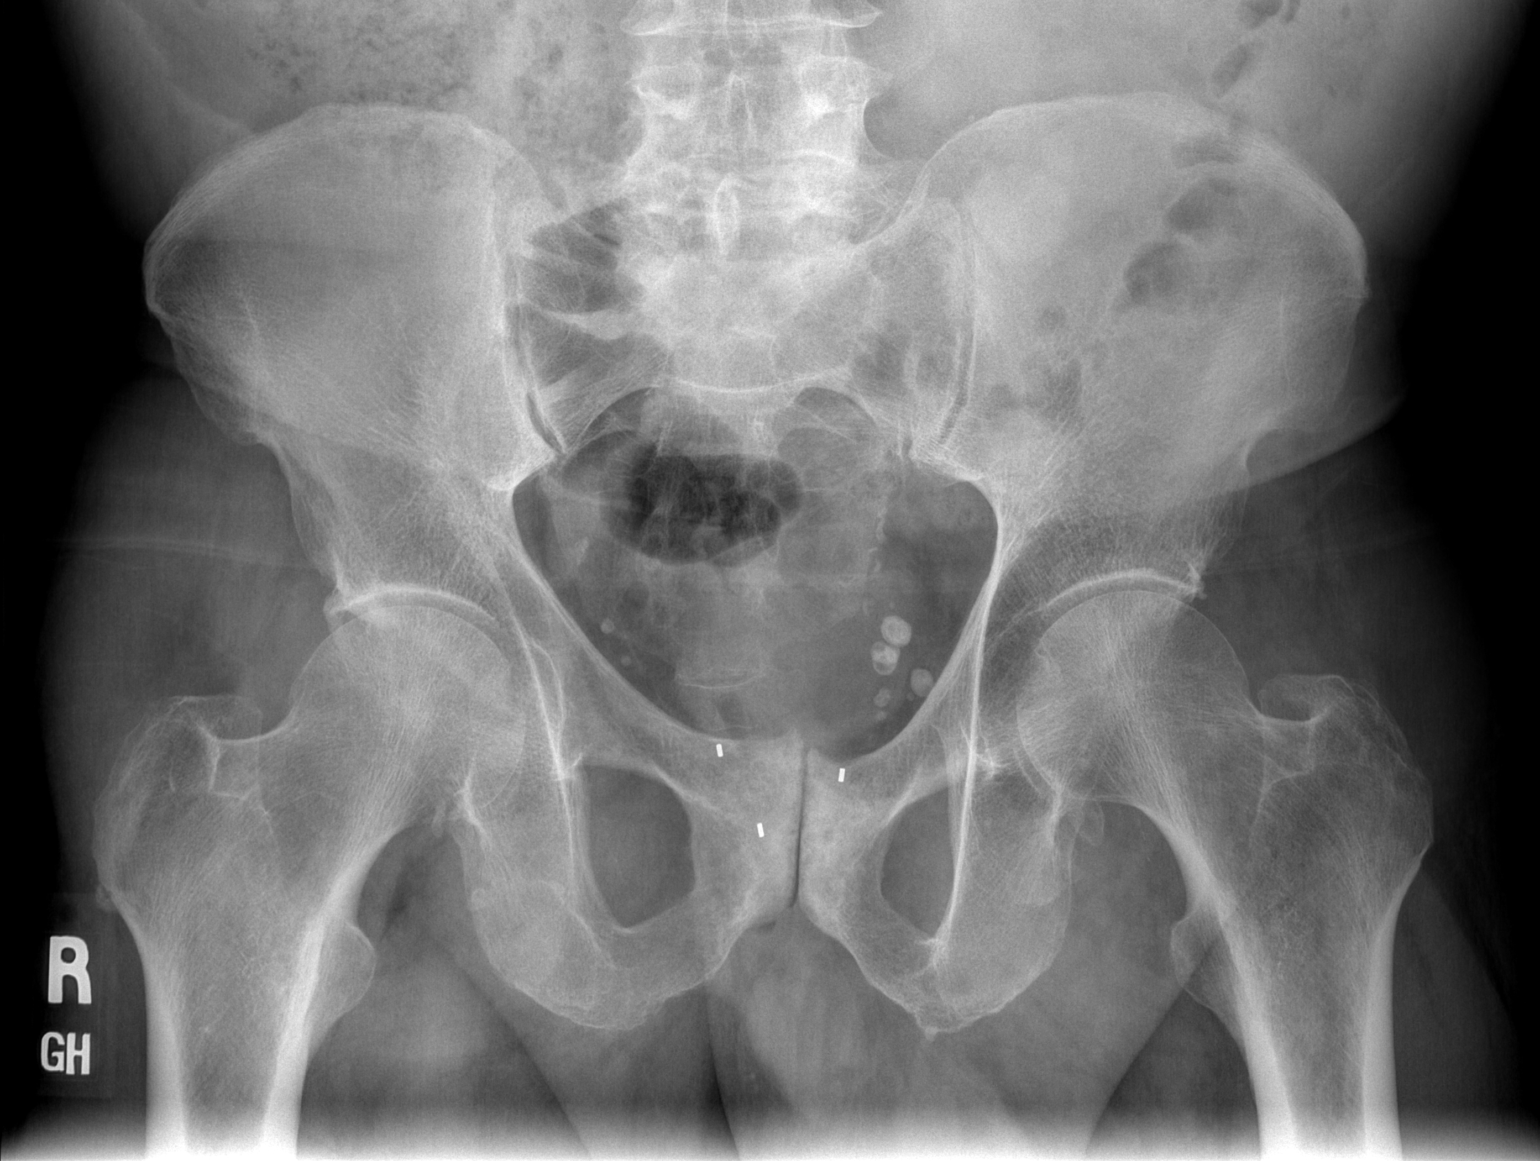

[t hip frog leg right]
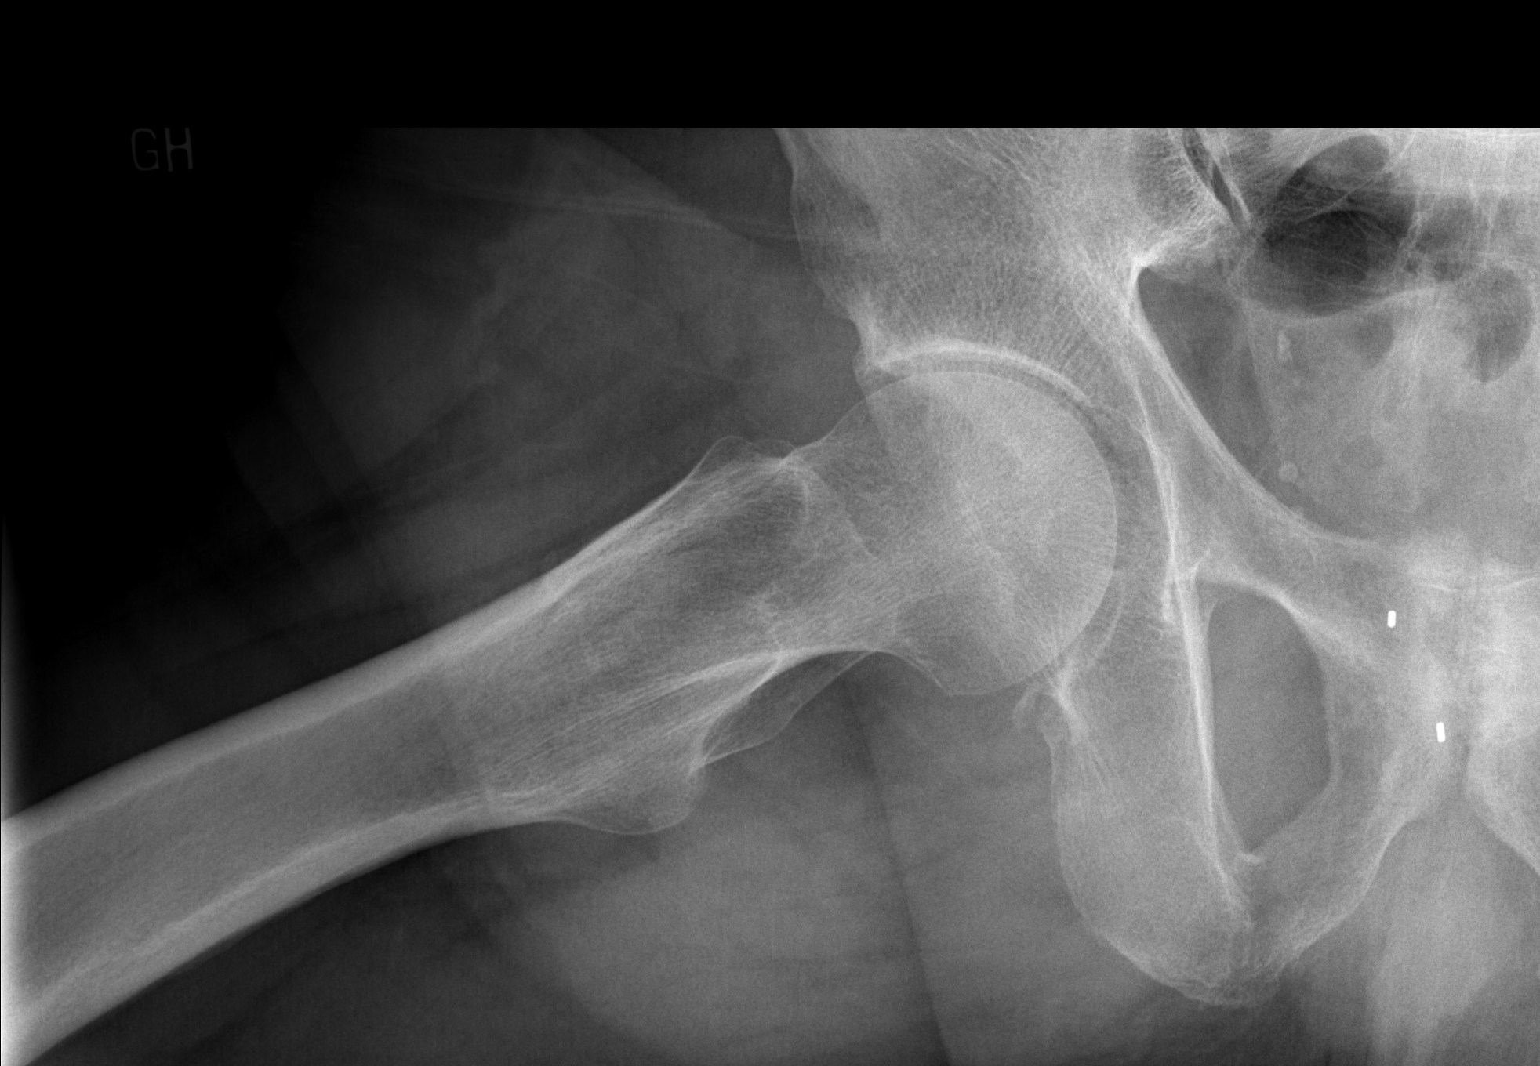

[2 of 2 positions shown; findings below may reference images not displayed]

FINDINGS: Degenerative changes of the pubic symphysis are noted. No acute
fracture or dislocation is seen. No soft tissue abnormality is
noted.
IMPRESSION: No acute abnormality seen.

## 2023-07-28 DIAGNOSIS — M4316 Spondylolisthesis, lumbar region: Secondary | ICD-10-CM | POA: Diagnosis not present

## 2023-07-28 DIAGNOSIS — M48062 Spinal stenosis, lumbar region with neurogenic claudication: Secondary | ICD-10-CM | POA: Diagnosis not present

## 2023-07-28 DIAGNOSIS — Z6829 Body mass index (BMI) 29.0-29.9, adult: Secondary | ICD-10-CM | POA: Diagnosis not present

## 2023-07-30 DIAGNOSIS — J439 Emphysema, unspecified: Secondary | ICD-10-CM | POA: Diagnosis not present

## 2023-07-30 DIAGNOSIS — N1832 Chronic kidney disease, stage 3b: Secondary | ICD-10-CM | POA: Diagnosis not present

## 2023-07-30 DIAGNOSIS — I2510 Atherosclerotic heart disease of native coronary artery without angina pectoris: Secondary | ICD-10-CM | POA: Diagnosis not present

## 2023-07-30 DIAGNOSIS — I10 Essential (primary) hypertension: Secondary | ICD-10-CM | POA: Diagnosis not present

## 2023-08-01 IMAGING — CT CT CHEST W/O CM
2 of 4 series · 15 of 36 positions shown, 18 images · non-contrast
Comparison: CT 12/11/2020 and 09/04/2020

CLINICAL DATA: Non-small cell lung cancer. Assess treatment
response.



[Series 2: thorax · axial · 0.88mm/px · z∈[-40,+204]mm · 12 of 146 slices shown, 15 images]
[im 12/146  mediastinal]
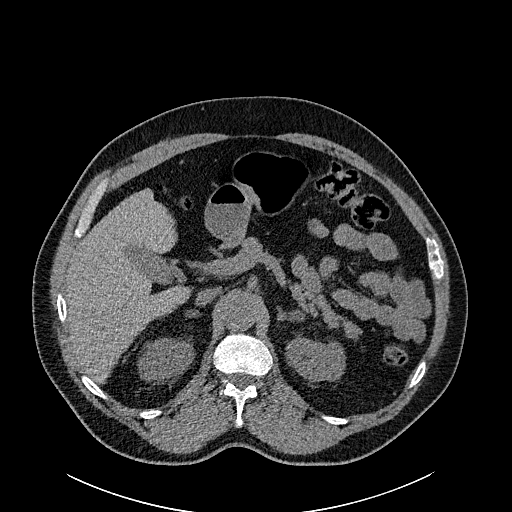
[im 12/146  lung]
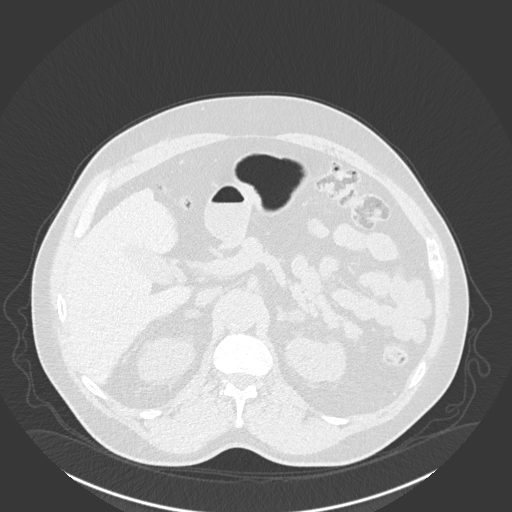
[im 23/146  lung]
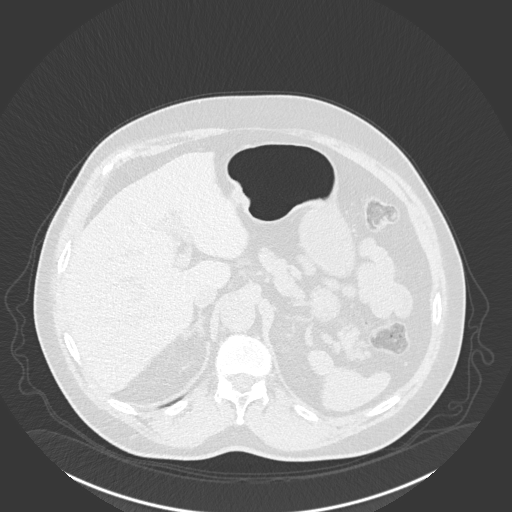
[im 34/146  lung]
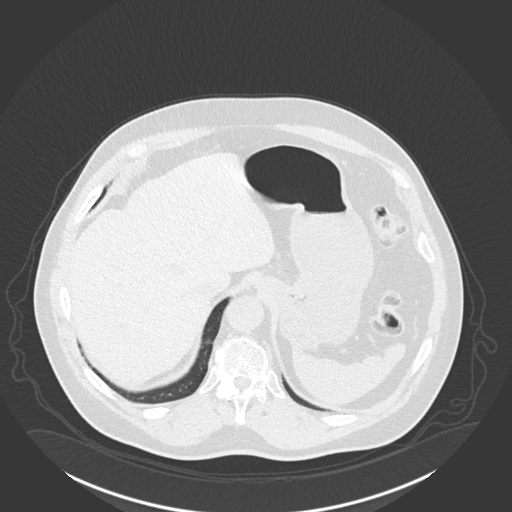
[im 45/146  lung]
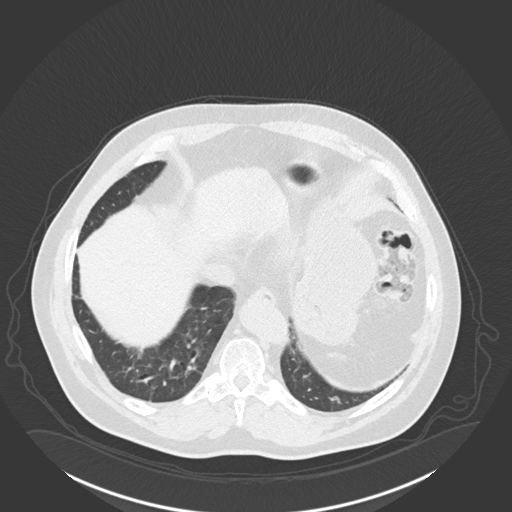
[im 56/146  mediastinal]
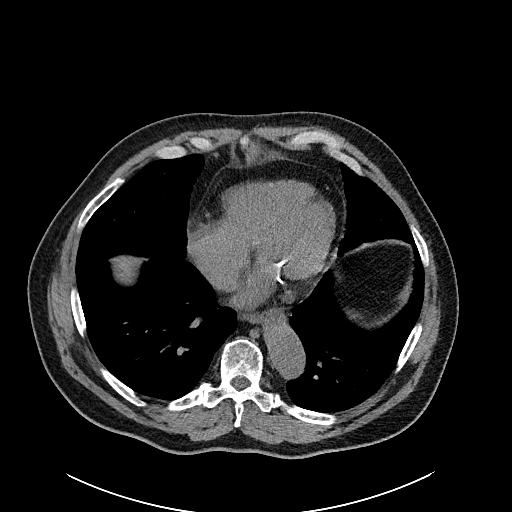
[im 56/146  lung]
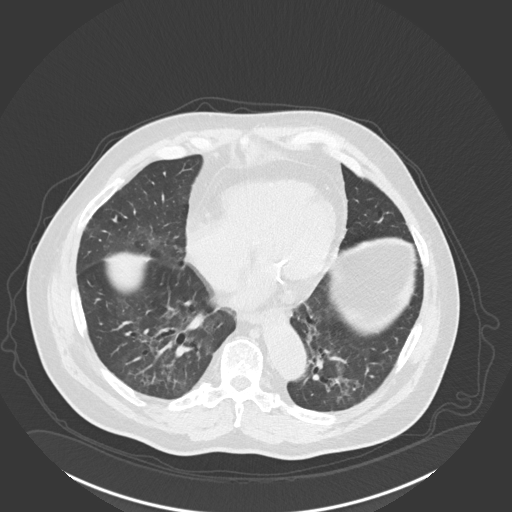
[im 67/146  lung]
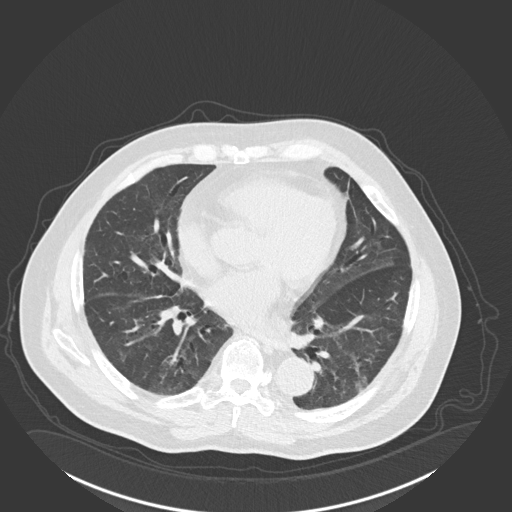
[im 79/146  lung]
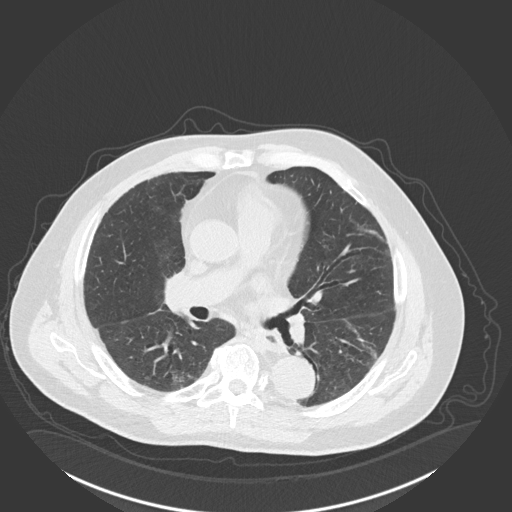
[im 90/146  lung]
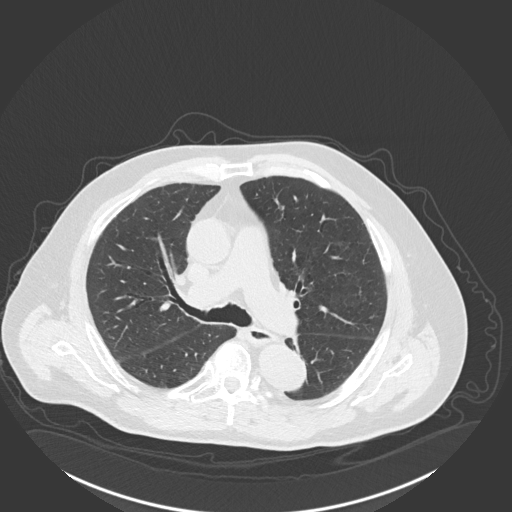
[im 101/146  mediastinal]
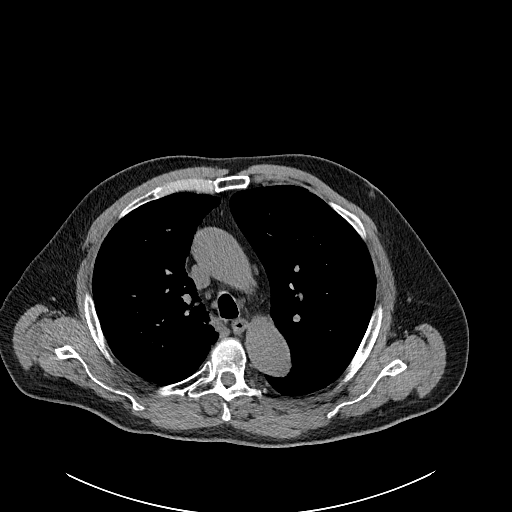
[im 101/146  lung]
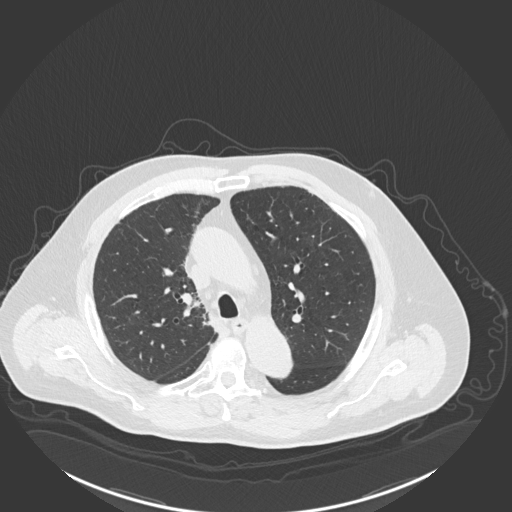
[im 112/146  lung]
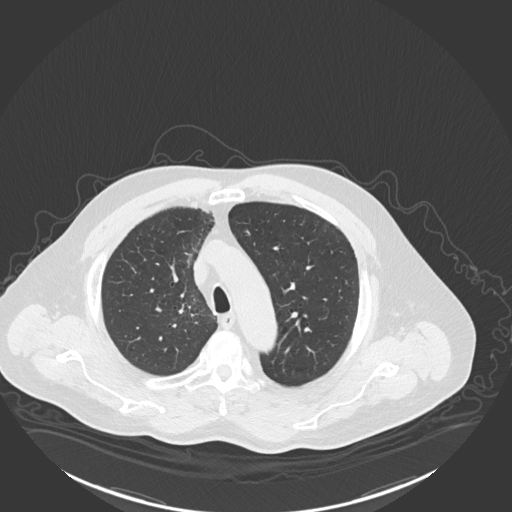
[im 123/146  lung]
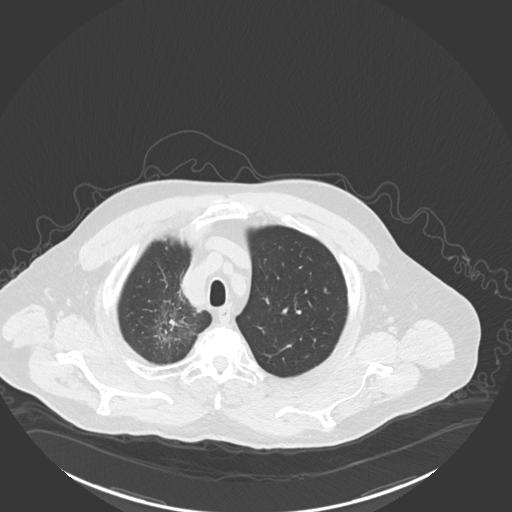
[im 134/146  lung]
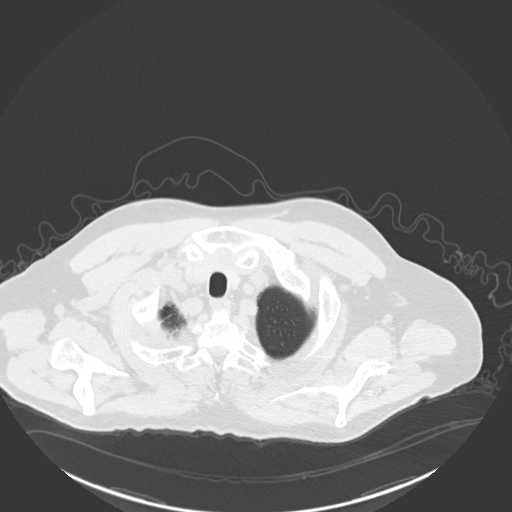

[Series 6: coronal · coronal · 0.62mm/px · 3 of 138 slices shown]
[im 28/138  lung]
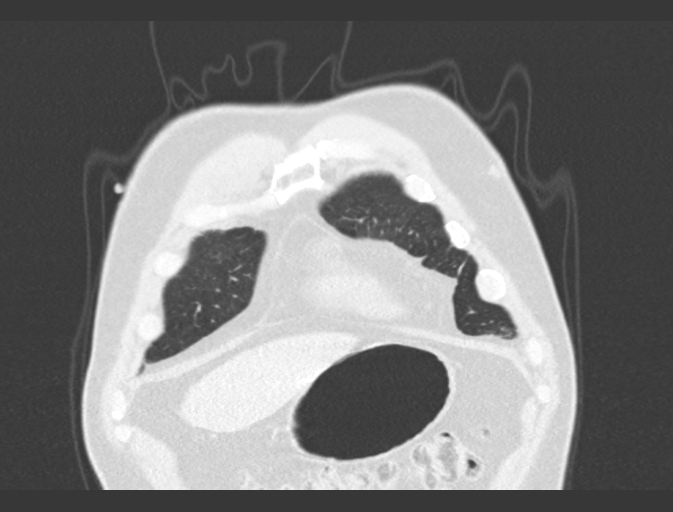
[im 55/138  lung]
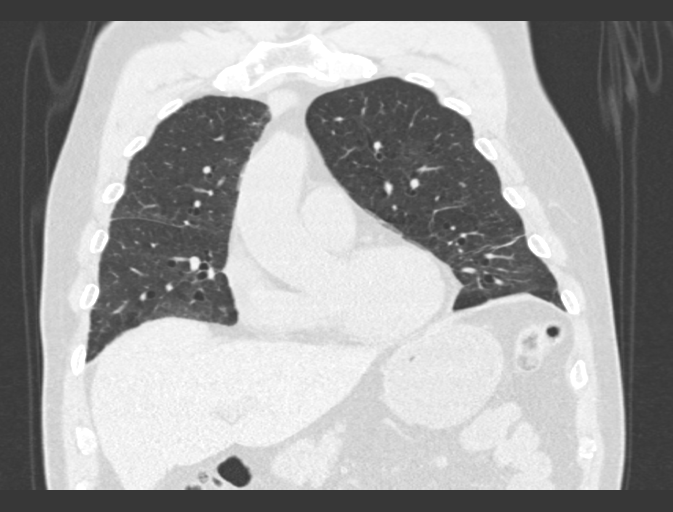
[im 83/138  lung]
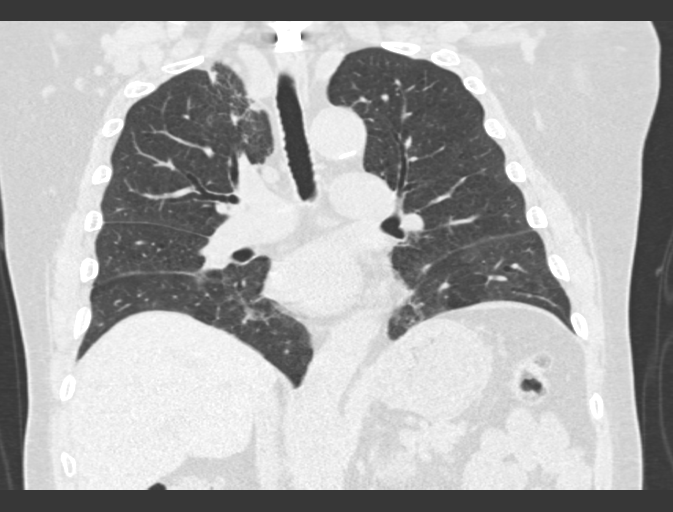

[15 of 36 positions shown; findings below may reference images not displayed]

FINDINGS: Cardiovascular: Stable minimal aortic and coronary artery
atherosclerosis. No acute vascular findings. The heart size is
normal. There is no pericardial effusion.

Mediastinum/Nodes: There are no enlarged mediastinal, hilar or
axillary lymph nodes.Small mediastinal lymph nodes are stable. Hilar
assessment is limited by the lack of intravenous contrast, although
the hilar contours appear unchanged. The thyroid gland, trachea and
esophagus demonstrate no significant findings.

Lungs/Pleura: There is no pleural effusion. Stable chronic lung
disease with moderate centrilobular and paraseptal emphysema and
scattered subpleural reticulation. The treated right apical lesion
is partly obscured by adjacent radiation changes, measuring
approximately 1.4 x 1.0 cm on image [DATE], similar to the prior
study. No new or enlarging pulmonary nodules are identified.

Upper abdomen: The visualized upper abdomen appears stable without
significant findings.

Musculoskeletal/Chest wall: There is no chest wall mass or
suspicious osseous finding. Multilevel thoracic spondylosis.
Previous lower cervical fusion.
IMPRESSION: 1. Similar size of treated right apical lesion with increased
surrounding opacities attributed to external beam radiation.
2. No evidence of local progression of disease or metastatic
disease.
3. Stable chronic lung disease with Emphysema ([id number]). and
scattered subpleural reticulation.
4. No acute findings.

## 2023-08-04 DIAGNOSIS — J439 Emphysema, unspecified: Secondary | ICD-10-CM | POA: Diagnosis not present

## 2023-08-04 DIAGNOSIS — N1832 Chronic kidney disease, stage 3b: Secondary | ICD-10-CM | POA: Diagnosis not present

## 2023-08-04 DIAGNOSIS — I2510 Atherosclerotic heart disease of native coronary artery without angina pectoris: Secondary | ICD-10-CM | POA: Diagnosis not present

## 2023-08-04 DIAGNOSIS — I10 Essential (primary) hypertension: Secondary | ICD-10-CM | POA: Diagnosis not present

## 2023-08-05 DIAGNOSIS — J069 Acute upper respiratory infection, unspecified: Secondary | ICD-10-CM | POA: Diagnosis not present

## 2023-08-05 DIAGNOSIS — M48061 Spinal stenosis, lumbar region without neurogenic claudication: Secondary | ICD-10-CM | POA: Diagnosis not present

## 2023-08-28 DIAGNOSIS — I10 Essential (primary) hypertension: Secondary | ICD-10-CM | POA: Diagnosis not present

## 2023-08-28 DIAGNOSIS — C61 Malignant neoplasm of prostate: Secondary | ICD-10-CM | POA: Diagnosis not present

## 2023-08-28 DIAGNOSIS — J439 Emphysema, unspecified: Secondary | ICD-10-CM | POA: Diagnosis not present

## 2023-08-28 DIAGNOSIS — E7800 Pure hypercholesterolemia, unspecified: Secondary | ICD-10-CM | POA: Diagnosis not present

## 2023-08-28 DIAGNOSIS — C3491 Malignant neoplasm of unspecified part of right bronchus or lung: Secondary | ICD-10-CM | POA: Diagnosis not present

## 2023-08-28 DIAGNOSIS — I2510 Atherosclerotic heart disease of native coronary artery without angina pectoris: Secondary | ICD-10-CM | POA: Diagnosis not present

## 2023-08-28 DIAGNOSIS — N1832 Chronic kidney disease, stage 3b: Secondary | ICD-10-CM | POA: Diagnosis not present

## 2023-09-04 NOTE — Progress Notes (Addendum)
 Mr. Afshin Chrystal  [id number] [birth year] (age: 73 y.o.)  Initial Visit:  Chief Complaint:  Chief Complaint  Patient presents with  . Consult    Lower back pain    History of Present Illness:  Mr. Hottel is a 73 year old male w/PMH of lung cancer, prostate cancer and cervial spine surgery (1990's) seen today for initial evaluation of lumbar spinal stenosis. He developed lumbar pain around 2-3 years ago. Describes pain as constant,  dull and achy. Rated 4-10/10. He has radiating pain into his buttocks, around the hips and into the anterior/lateral thighs. He has groin pain on the right.  At times his pain on the right is so severe he limps. Pain disrupts his sleep. Denies focal weakness. 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    He underwent L4-L5 ESI a few weeks ago with 4 days of relief. He uses Tylenol  as needed. No other OTC or prescription medications for this pain. He has not participated in PT.   The following portions of the patient's history were reviewed and updated as appropriate: allergies, current medications, past family history, past medical history, past social history, past surgical history and problem list.  Past Medical History:  Diagnosis Date  . High cholesterol   . Hypertension    Past Surgical History:  Procedure Laterality Date  . BACK SURGERY     Procedure: BACK SURGERY  . COLONOSCOPY     Procedure: COLONOSCOPY  . SHOULDER SURGERY Right    Procedure: SHOULDER SURGERY  . WRIST SURGERY Right    Procedure: WRIST SURGERY   Review of Systems:   Review of Systems  Musculoskeletal:  Positive for back pain. Negative for gait problem.  Neurological:  Positive for numbness. Negative for weakness.    Home Medications           * atorvastatin (LIPITOR) 10 mg tablet   * cholecalciferol (VITAMIN D3) 2,000 unit tablet   * fluticasone propionate (FLONASE) 50 mcg/spray nasal spray   * ketoconazole (NIZORAL) 2 % shampoo   *  valsartan-hydroCHLOROthiazide 320-25 mg tab      Physical Exam: Physical Exam Vitals reviewed.  Constitutional:      Appearance: Normal appearance.  HENT:     Head: Normocephalic.   Eyes:     Extraocular Movements: Extraocular movements intact.     Conjunctiva/sclera: Conjunctivae normal.   Pulmonary:     Effort: Pulmonary effort is normal.   Skin:    General: Skin is warm and dry.   Neurological:     General: No focal deficit present.     Mental Status: He is alert and oriented to person, place, and time.     Motor: Motor strength is normal.  Psychiatric:        Mood and Affect: Mood normal.        Speech: Speech normal.        Behavior: Behavior normal.        Judgment: Judgment normal.    Neurological Exam Mental Status Alert. Oriented to person, place, time and situation. Oriented to person, place, and time. Speech is normal. Language is fluent with no aphasia. Attention and concentration are normal.  Cranial Nerves CN III, IV, VI: Extraocular movements intact bilaterally.  Motor Normal muscle bulk throughout. No fasciculations present. Normal muscle tone. Strength is 5/5 throughout all four extremities.  Sensory Light touch is normal in upper and lower extremities.   Gait Casual gait is normal including stance, stride, and arm swing.  Vitals:  Vitals:   09/05/23 0854  BP: 114/75  Pulse: 82  Temp: 97.9 F (36.6 C)  SpO2: 96%   Radiographic Studies:  Supine and upright lumbar spine x-rays obtained today and personally reviewed.  There is grade 1 anterolisthesis of L4 on L5, accentuated when upright.  There is multilevel disc height loss and facet disease.   MR SPINE LUMBAR 07/17/2023 FINDINGS:  Segmentation: Standard.  Alignment:  Grade 1 anterolisthesis of L4 on L5.  Vertebrae: Degenerative endplate marrow changes at a few levels. No  compression fractures. Partially visualized T1 hypointensity and  STIR hyperintensity involving the sacral ala  bilaterally,  anteriorly.  Conus medullaris and cauda equina: The conus medullaris terminates  at the level of L1-L2. The distal spinal cord signal intensity is  normal.  Paraspinal and other soft tissues: The visualized abdomen and pelvis  show no soft tissue abnormality. The visualized aorta is normal.  Disc levels:  L1-L2: Mild disc bulge. Mild bilateral facet arthropathy. Mild  bilateral neuroforaminal stenosis. No spinal canal stenosis.  L2-L3: Disc bulge. Moderate bilateral facet arthropathy. Moderate  bilateral neuroforaminal stenosis. Mild spinal canal stenosis.  L3-L4: Disc bulge. Mild bilateral facet arthropathy. Severe  bilateral neuroforaminal stenosis. Mild spinal canal stenosis.  L4-L5: Disc bulge. Severe bilateral facet arthropathy. Severe  bilateral neuroforaminal stenosis. Severe spinal canal stenosis.  L5-S1: Disc bulge. Severe bilateral facet arthropathy. Severe right  and moderate left neuroforaminal stenosis. Moderate to severe spinal  canal stenosis.  IMPRESSION:  1. Partially visualized abnormal signal in the sacral ala  bilaterally anteriorly. Findings may represent subacute sacral  insufficiency fractures.  2. Moderate to severe canal stenosis at L4-L5 and L5-S1 secondary to  disc bulging and facet arthropathy. Severe foraminal stenoses at  these levels as well as at L3-L4.    Assessment:  1. Spinal stenosis of lumbar region, unspecified whether neurogenic claudication present  XR Spine Lumbar Complete 4+ Views        Plan: Mr. Ambrosini is a pleasant 73 year old male seen today for initial evaluation of lumbar spinal stenosis. He is neurologically intact on exam. We reviewed his lumbar MRI which demonstrates severe stenosis and anterolisthesis of L4 on L5. He is interested in surgical options. I will consult with Dr. Michiel and follow-up with recommendations next week via telephone.      Follow up: No follow-ups on file.  Orders Placed This Encounter   Procedures  . XR Spine Lumbar Complete 4+ Views   Electronically signed by: Elvie Rosaline Lung, FNP 09/05/2023 9:17 AM. I have personally spent 50 minutes involved in face-to-face and non-face-to-face activities for this patient on the day of the visit. Professional time spent includes the following activities, in addition to those noted in the documentation: Reviewing outside records, reviewing vitals, reviewing prior and current imaging, physical examination, and counseling patient.   *Some images could not be shown.

## 2023-09-05 DIAGNOSIS — M4316 Spondylolisthesis, lumbar region: Secondary | ICD-10-CM | POA: Diagnosis not present

## 2023-09-05 DIAGNOSIS — M48061 Spinal stenosis, lumbar region without neurogenic claudication: Secondary | ICD-10-CM | POA: Diagnosis not present

## 2023-09-19 DIAGNOSIS — I288 Other diseases of pulmonary vessels: Secondary | ICD-10-CM | POA: Diagnosis not present

## 2023-09-19 DIAGNOSIS — M5431 Sciatica, right side: Secondary | ICD-10-CM | POA: Diagnosis not present

## 2023-09-19 DIAGNOSIS — J701 Chronic and other pulmonary manifestations due to radiation: Secondary | ICD-10-CM | POA: Diagnosis not present

## 2023-09-22 DIAGNOSIS — M5116 Intervertebral disc disorders with radiculopathy, lumbar region: Secondary | ICD-10-CM | POA: Diagnosis not present

## 2023-09-24 ENCOUNTER — Other Ambulatory Visit (HOSPITAL_COMMUNITY): Payer: Self-pay | Admitting: Family Medicine

## 2023-09-24 DIAGNOSIS — I288 Other diseases of pulmonary vessels: Secondary | ICD-10-CM

## 2023-09-27 DIAGNOSIS — I10 Essential (primary) hypertension: Secondary | ICD-10-CM | POA: Diagnosis not present

## 2023-09-27 DIAGNOSIS — N1832 Chronic kidney disease, stage 3b: Secondary | ICD-10-CM | POA: Diagnosis not present

## 2023-09-27 DIAGNOSIS — I2510 Atherosclerotic heart disease of native coronary artery without angina pectoris: Secondary | ICD-10-CM | POA: Diagnosis not present

## 2023-09-27 DIAGNOSIS — J439 Emphysema, unspecified: Secondary | ICD-10-CM | POA: Diagnosis not present

## 2023-09-28 DIAGNOSIS — C61 Malignant neoplasm of prostate: Secondary | ICD-10-CM | POA: Diagnosis not present

## 2023-09-28 DIAGNOSIS — C3491 Malignant neoplasm of unspecified part of right bronchus or lung: Secondary | ICD-10-CM | POA: Diagnosis not present

## 2023-09-28 DIAGNOSIS — I10 Essential (primary) hypertension: Secondary | ICD-10-CM | POA: Diagnosis not present

## 2023-09-28 DIAGNOSIS — E7800 Pure hypercholesterolemia, unspecified: Secondary | ICD-10-CM | POA: Diagnosis not present

## 2023-09-28 DIAGNOSIS — J439 Emphysema, unspecified: Secondary | ICD-10-CM | POA: Diagnosis not present

## 2023-09-28 DIAGNOSIS — I2510 Atherosclerotic heart disease of native coronary artery without angina pectoris: Secondary | ICD-10-CM | POA: Diagnosis not present

## 2023-09-28 DIAGNOSIS — N1832 Chronic kidney disease, stage 3b: Secondary | ICD-10-CM | POA: Diagnosis not present

## 2023-09-30 DIAGNOSIS — M5431 Sciatica, right side: Secondary | ICD-10-CM | POA: Diagnosis not present

## 2023-09-30 DIAGNOSIS — I288 Other diseases of pulmonary vessels: Secondary | ICD-10-CM | POA: Diagnosis not present

## 2023-10-02 ENCOUNTER — Telehealth: Payer: Self-pay | Admitting: Medical Oncology

## 2023-10-02 NOTE — Telephone Encounter (Signed)
 FYI-Wife call and said pt is willing to talk about his cancer journey to/help others. Nothing  specific, but that he wanted to consider it later as he may be having back surgery.

## 2023-10-13 NOTE — Telephone Encounter (Signed)
 I called and spoke with Mr and Ms Jason [name].  I introduced myself and why I would be involved in his upcoming ALIF with Dr. Michiel.  I described anterior extraperitoneal spine exposure and the associated risks.  They ask that I plan to assist with their surgery.

## 2023-10-14 NOTE — Telephone Encounter (Signed)
 Incoming call from patient's wife Jason [name] regarding the CONSULT appointment that was offered and scheduled for today. She reports that the patient accepted the appointment however they are not able to make it as they have other appointments today. Jason [name] has requested to cancel the appointment and reschedule the CONSULT appointment on 11/4 that the patient was originally scheduled for.    Appointment has been canceled and rescheduled per her request. No further action is needed at this time.

## 2023-10-16 NOTE — Telephone Encounter (Signed)
    Division of Pharmacy Services  Medication History Completion Note  Name/DOB/Age of Patient: Jason [name] / 1950-10-14 / 73 y.o.  Location: [facility]  Type: Presurgical & Modality: Phone  Confirmed two patient identifiers: Yes  Confirmed patient is alert & oriented: Yes  Medication History Source (Med History Informants):  Patient Other Dr. Annemarie   Asked about any missing medications (such as pumps, injectable meds, TPN, OTC, etc): Yes  PTA Med List:  Prior to Admission Medications     Reviewed by Kelly Dorothyann Dolly, CPhT on 10/16/23 at 1619    Medication Sig Last Dose Informant Taking? Status  alpha tocopherol (VITAMIN E) 268 mg (400 unit) capsule Take 400 Units by mouth daily. 10/16/2023 Self, Other Yes Active  ascorbic acid (VITAMIN C) 500 mg tablet Take 500 mg by mouth daily. 10/16/2023 Self, Other Yes Active  atorvastatin (LIPITOR) 10 mg tablet Take 10 mg by mouth daily. 10/16/2023 Self, Other Yes Active  azelastine (ASTELIN) 137 mcg (0.1 %) nasal spray Administer 1 spray into each nostril 2 (two) times a day. Use in each nostril as directed 10/16/2023 Self, Other Yes Active  cholecalciferol (VITAMIN D3) 2,000 unit tablet Take 2,000 Units by mouth daily. 10/16/2023 Self, Other Yes Active  fluticasone propionate (FLONASE) 50 mcg/spray nasal spray Administer 2 sprays into each nostril daily. 10/16/2023 Self, Other Yes Active  gabapentin (NEURONTIN) 300 mg capsule Take 300 mg by mouth 2 (two) times a day. 10/16/2023 Self, Other Yes Active         Med Note (BENNETT, ELISE C   Thu Oct 16, 2023  4:15 PM) Medication last fill date 09/19/2023 for #60/30 from [facility]   Patient not taking:    Discontinued 10/16/23 1616 (Med List Clean up - No Rx eCancel Message Sent)  levocetirizine (XYZAL) 5 mg tablet Take 5 mg by mouth daily. 10/16/2023 Self, Other Yes Active  methocarbamoL  (ROBAXIN ) 500 mg tablet Take 500 mg by mouth daily. 10/16/2023 Self, Other Yes Active  montelukast  (SINGULAIR) 10 mg tablet Take 10 mg by mouth at bedtime. 10/15/2023 Bedtime Self, Other Yes Active    Discontinued 10/16/23 1616 (Med List Clean up - No Rx eCancel Message Sent)    Discontinued 10/16/23 1616 (Med List Clean up - No Rx eCancel Message Sent)  valsartan-hydroCHLOROthiazide (DIOVAN HCT) 160-12.5 mg per tablet Take 1 tablet by mouth daily. 10/16/2023 Self, Other Yes Active  venlafaxine (EFFEXOR) 25 mg tablet Take 25 mg by mouth 2 (two) times a day. 10/16/2023 Self, Other Yes Active            Selected Pharmacy:  [facility]  Comments:  All medications and allergies were verified over the phone by the patient who is a good historian, and last fills/doses were verified by Dr. Annemarie.  Medications removed: Ketoconazole, Prednisone, and Tramadol (therapy complete)  Reviewed by Certified Medication List Pharmacy Technician Electronically signed by: Kelly Dorothyann Dolly, CPhT 10/16/2023 4:17 PM  Anticipated Procedure Date: 10/28/2023  DPS enrolled for delivery. Please call [phone] with questions for patients bedded in [facility] and [phone] for all other locations.  Medications reconciled by provider: No   Signature/Co-signature, if required: Kelly Dorothyann Dolly, CPhT   Date/Time: 10/16/2023 4:21 PM

## 2023-10-17 DIAGNOSIS — Z01818 Encounter for other preprocedural examination: Secondary | ICD-10-CM | POA: Diagnosis not present

## 2023-10-17 DIAGNOSIS — M5416 Radiculopathy, lumbar region: Secondary | ICD-10-CM | POA: Diagnosis not present

## 2023-10-17 NOTE — Unmapped External Note (Signed)
 PREOPERATIVE OPTIMIZATION EVALUATION Encounter Date: 10/17/23   PROCEDURE PLANNED   Jason [name]. ([id number]) is a 73 y.o. male scheduled for a  Procedure Information     Date/Time: 10/28/23 0645   Procedures:      L4-5 ALIF,   O-Arm; Medtronic for both stages, BMP, (Spine Cervical) - L4-5 ALIF, L4-5 posterior pedicle fusion. 5 hours total O-Arm; Medtronic for both stages, BMP, Dr. Okey Moats to assist     L4-5 posterior pedicle fusion.  O-Arm; Medtronic; BMP (Spine Thoracic) - O-Arm, Medtronic, BMP     LAPAROTOMY EXPLORATORY WITH MEDIAN ARCUATE LIGAMENT RELEASE   Location: [facility] OR 15 / [facility] OR   Surgeons: Darryle Earthly, MD; Okey Moats, MD       HPI: Jason [name]. is an ASA 3, 95.6 kg,  Body mass index is 30.23 kg/m., who presents today for medical optimization and to ensure stabilization, of the following sPMH: HTN, hyperlipidemia, tobacco smoker, lung cancer, prostate cancer, CKD 3, anemia, s/p cervical spine sx 1990s, anxiety, lumbar radiculopathy .  Jason [name]. has a h/o lumbar radiculopathy that requires surgical intervention. For full details regarding this pt's reason for surgery please review their surgeon's H&P.      ASSESSMENT AND PLAN  Anesthesia History: Pt denies history of PONV or other anesthesia complications; No family history of adverse reaction to anesthesia.   06/06/2020: mask vent w/o diff, MIL and 3, Gr I view, ETT 8.5, stylet, 1 attempt  Surgical History: Surgical History[1]  Assessment of Medical Problems: -------------------------------------------------------------------------------------------------- 1.) Surgical Complaint  -- LUMBAR RADICULOPATHY: as discussed in HPI; reason for surgery  -- continue Gabapentin and Robaxin  as usual  -- reports that he finished Pred over a week ago -------------------------------------------------------------------------------------------------- 2.) CARDIAC:  -- HTN - hold  Valsartan-hydrochlorothiazide DOS BP Readings from Last 3 Encounters:  10/17/23 123/73  09/22/23 128/79  09/05/23 114/75     -- HYPERLIPIDEMIA - continue Lipitor as usual   -- EKG today   -- FUNCTIONAL CAPACITY: limited 2/2 back pain; walks around in the house, uses the stairs at home, walked from parking deck to [facility] - denies cp/sob -------------------------------------------------------------------------------------------------- 3.) RESPIRATORY:   -- LUNG CANCER (2022) - RT  -- quit smoking in ~ 2021   -- ALLERGIES - continue Singulair, Astelin spray as usual. Hold Xyzal DOS -------------------------------------------------------------------------------------------------- 4.) GU:  -- PROSTATE CANCER 2022  -- tx: (+) chemo/rad tx   -- CKD 3  -- last creat 2.20, GFR 31 (06/09/2023)  -- f/b Nephro -------------------------------------------------------------------------------------------------- 5.) HEMATOLOGY:  -- ANEMIA   -- last Hgb 12.3 (06/09/2023) -------------------------------------------------------------------------------------------------- 6.) MSK:  -- S/P CERVICAL SPINE SX 1990s  -- residual sxs: none per pt -------------------------------------------------------------------------------------------------- 7.) PSYCH:  -- ANXIETY- continue Effexor as usual --------------------------------------------------------------------------------------------------   Diagnostics pending: EKG, CBC, BMP, and T&S   -- ADDENDUM 10-17-2023: Lab Results  Component Value Date   WBC 5.10 10/17/2023   HGB 11.9 (L) 10/17/2023   HCT 35.0 (L) 10/17/2023   MCV 86.8 10/17/2023   PLT 288 10/17/2023   Lab Results  Component Value Date   GLUCOSE 89 10/17/2023   CALCIUM 9.3 10/17/2023   NA 140 10/17/2023   K 4.6 10/17/2023   CO2 27 10/17/2023   CL 106 10/17/2023   BUN 33 (H) 10/17/2023   CREATININE 2.14 (H) 10/17/2023     Diagnostics Review: BP Readings from Last 3 Encounters:   10/17/23 123/73  09/22/23 128/79  09/05/23 114/75    Wt Readings from Last 3 Encounters:  10/17/23 95.6 kg (210 lb 11.2 oz)  09/22/23 96.1 kg (211 lb 12.8 oz)  09/05/23 93.4 kg (206 lb)     For pt's complete list of medication instructions please review separate AVS note.  -- Addendum for Additional Evaluation, Management, and Time ---  During the course of this preoperative evaluation, additional time and effort was required that extends beyond a routine presurgical assessment.  The assessment and plan that is detailed above which  identifies the extenuating medical or social circumstances that required  further diagnostic evaluation, planning  and active management in order to optimize this patient prior to surgery.   I have personally spent 40 minutes involved in face-to-face and non-face-to-face activities for this patient on the day of the visit.  Professional time spent includes the following activities, in addition to those noted in the documentation: preparing to see the patient by review of history and previous tests, obtaining and reviewing separately obtained history, performing medically appropriate examination and evaluation, counseling and engaging in shared decision making with the patient and family available, and ordering further tests with independent interpretation       [facility] Activity Status Index: DASI Total Score: 18.95  DASI METs: 5.07   Anesthesia Plan/Consents:  Anesthesia Plan  Plan ASA score: 3     PAC Attestation:  Anesthesia was discussed with notations that the final disposition regarding anesthetic management will be addressed by the anesthesiologist on the day of surgery.   I have reviewed the patient's presurgical instructions and preop medication instructions. The patient verbalized understanding of this information. Please refer to the surgeon's clinic notes for documentation of the reason for surgery, details of the surgical pathology, and consents  related to the procedure.  Patient instructed to bring photo id the day of surgery, remain NPO after 11PM except for clear liquids 2 hours prior to arrival time, and to call for interval change mandating surgery cancellation.   Electronically signed by: Fredie Dasie Goods, FNP, 10/17/2023 11:39 AM   Supervised by Dr. Athena    HISTORY/ROS/OBJECTIVE  Active Problem list: Patient Active Problem List   Diagnosis Date Noted   . HTN (hypertension) 10/17/2023  . Intervertebral disc disorder with radiculopathy of lumbar region 09/22/2023  . Current every day smoker 06/24/2019  . Seasonal allergic rhinitis due to pollen 06/24/2019    Resolved Problems  No resolved problems to display.   Past Medical History: Medical History[2] Social History:   Social History   Tobacco Use  . Smoking status: Former    Types: Cigarettes  . Smokeless tobacco: Never  Substance Use Topics  . Alcohol use: Not Currently    Family History: Family History[3] ROS:  Review of Systems  Constitutional: Positive for fever (low grade, ~ 1 month ago). Negative for chills, malaise/fatigue and weight loss.  HENT:  Negative for hearing loss and tinnitus.   Eyes:  Positive for redness (d/t sinus congestion). Negative for blurred vision and double vision.  Cardiovascular:  Negative for chest pain, leg swelling, orthopnea, palpitations and syncope.       Denies hx CHF/MI  Respiratory:  Positive for snoring and wheezing (occ, uses Albuterol and it helps). Negative for shortness of breath.   Endocrine:       Denies hx thyroid  disease/DM  Hematologic/Lymphatic:       Denies hx DVT  Skin:  Negative for itching and rash.  Musculoskeletal:  Positive for back pain and myalgias. Negative for joint pain and neck pain.  Gastrointestinal:  Negative for  abdominal pain, constipation, diarrhea, heartburn, nausea and vomiting.       Denies hx liver disease  Genitourinary:  Negative for frequency and urgency.   Neurological:  Positive for focal weakness (RLE d/t spine issue). Negative for dizziness, headaches, light-headedness, seizures and weakness.       Denies hx CVA  Psychiatric/Behavioral:  Negative for depression and substance abuse. The patient is nervous/anxious. The patient does not have insomnia.    Vitals: Vitals:   10/17/23 1158  BP: 123/73  Pulse: 91  Temp: 97.2 F (36.2 C)  TempSrc: Temporal  SpO2: 97%  Weight: 95.6 kg (210 lb 11.2 oz)  Height: 1.778 m (5' 10)   Physical Exam:  Physical Exam  Airway  Mallampati: III TM Distance (FB): 3 Oral Aperture (FB): 3 Neck ROM: full ROM Cardiovascular  Rhythm: regular Rate: normal no murmur, no carotid bruits and no peripheral edema Dental  upper dentures Pulmonary  breath sounds clear to auscultation Neurological  Neurological: alert   Allergies: Allergies[4] Medications: Current Medications[5]  Labs: No results found for: WBC, HGB, HCT, PLT No results found for: NA, K, CL, CO2, CREATININE, GLU, CALCIUM, PROT, ALBUMIN, BILITOT, AST, ALT No components found for: HBA1C No results found for: INR No results found for: TSH       [1] Past Surgical History: Procedure Laterality Date  . BUNIONECTOMY Right   . CERVICAL SPINE SURGERY     1990s  . COLONOSCOPY     Procedure: COLONOSCOPY  . OTHER SURGICAL HISTORY     REMOVAL OF SHRAPNEL - right shoulder, rt inguinal area  . ROTATOR CUFF REPAIR Right   . WRIST FRACTURE SURGERY Right    x 2  [2] Past Medical History: Diagnosis Date  . High cholesterol   . Hypertension   [3] No family history on file. [4] No Known Allergies [5]  Current Outpatient Medications:  .  alpha tocopherol (VITAMIN E) 268 mg (400 unit) capsule, Take 400 Units by mouth daily., Disp: , Rfl:  .  ascorbic acid (VITAMIN C) 500 mg tablet, Take 500 mg by mouth daily., Disp: , Rfl:  .  atorvastatin (LIPITOR) 10 mg tablet, Take 10 mg by mouth daily.,  Disp: , Rfl:  .  azelastine (ASTELIN) 137 mcg (0.1 %) nasal spray, Administer 1 spray into each nostril 2 (two) times a day. Use in each nostril as directed, Disp: , Rfl:  .  cholecalciferol (VITAMIN D3) 2,000 unit tablet, Take 2,000 Units by mouth daily., Disp: , Rfl:  .  fluticasone propionate (FLONASE) 50 mcg/spray nasal spray, Administer 2 sprays into each nostril daily., Disp: , Rfl:  .  gabapentin (NEURONTIN) 300 mg capsule, Take 300 mg by mouth 2 (two) times a day., Disp: , Rfl:  .  levocetirizine (XYZAL) 5 mg tablet, Take 5 mg by mouth daily., Disp: , Rfl:  .  methocarbamoL  (ROBAXIN ) 500 mg tablet, Take 500 mg by mouth daily., Disp: , Rfl:  .  montelukast (SINGULAIR) 10 mg tablet, Take 10 mg by mouth at bedtime., Disp: , Rfl:  .  valsartan-hydroCHLOROthiazide (DIOVAN HCT) 160-12.5 mg per tablet, Take 1 tablet by mouth daily., Disp: , Rfl:  .  venlafaxine (EFFEXOR) 25 mg tablet, Take 25 mg by mouth 2 (two) times a day., Disp: , Rfl:

## 2023-10-23 ENCOUNTER — Telehealth: Payer: Self-pay | Admitting: Medical Oncology

## 2023-10-23 ENCOUNTER — Ambulatory Visit (HOSPITAL_COMMUNITY)
Admission: RE | Admit: 2023-10-23 | Discharge: 2023-10-23 | Disposition: A | Discharge status: Home / Self Care | Source: Ambulatory Visit | Attending: Cardiology | Admitting: Cardiology

## 2023-10-23 DIAGNOSIS — I10 Essential (primary) hypertension: Secondary | ICD-10-CM | POA: Insufficient documentation

## 2023-10-23 DIAGNOSIS — Z87891 Personal history of nicotine dependence: Secondary | ICD-10-CM | POA: Insufficient documentation

## 2023-10-23 DIAGNOSIS — I288 Other diseases of pulmonary vessels: Secondary | ICD-10-CM | POA: Diagnosis not present

## 2023-10-23 DIAGNOSIS — I77810 Thoracic aortic ectasia: Secondary | ICD-10-CM | POA: Diagnosis not present

## 2023-10-23 DIAGNOSIS — Z8249 Family history of ischemic heart disease and other diseases of the circulatory system: Secondary | ICD-10-CM | POA: Insufficient documentation

## 2023-10-23 LAB — ECHOCARDIOGRAM COMPLETE
Area-P 1/2: 3.09 cm2
S' Lateral: 3.1 cm

## 2023-10-23 NOTE — Telephone Encounter (Signed)
 LVM to return my call re pt talking about his lung cancer journey.   I told her Dr. Sherrod said someone from the organization may have contacted him about this. I gave her the information about the LUNGE forward walk and celebration on October 4th. I expressed that I hope he is doing well.

## 2023-10-27 DIAGNOSIS — Z01810 Encounter for preprocedural cardiovascular examination: Secondary | ICD-10-CM | POA: Diagnosis not present

## 2023-10-28 DIAGNOSIS — Z85118 Personal history of other malignant neoplasm of bronchus and lung: Secondary | ICD-10-CM | POA: Diagnosis not present

## 2023-10-28 DIAGNOSIS — M5416 Radiculopathy, lumbar region: Secondary | ICD-10-CM | POA: Diagnosis not present

## 2023-10-28 DIAGNOSIS — J439 Emphysema, unspecified: Secondary | ICD-10-CM | POA: Diagnosis not present

## 2023-10-28 DIAGNOSIS — C61 Malignant neoplasm of prostate: Secondary | ICD-10-CM | POA: Diagnosis not present

## 2023-10-28 DIAGNOSIS — J810 Acute pulmonary edema: Secondary | ICD-10-CM | POA: Diagnosis not present

## 2023-10-28 DIAGNOSIS — J441 Chronic obstructive pulmonary disease with (acute) exacerbation: Secondary | ICD-10-CM | POA: Diagnosis not present

## 2023-10-28 DIAGNOSIS — R0602 Shortness of breath: Secondary | ICD-10-CM | POA: Diagnosis not present

## 2023-10-28 DIAGNOSIS — M48061 Spinal stenosis, lumbar region without neurogenic claudication: Secondary | ICD-10-CM | POA: Diagnosis not present

## 2023-10-28 DIAGNOSIS — R0902 Hypoxemia: Secondary | ICD-10-CM | POA: Diagnosis not present

## 2023-10-28 DIAGNOSIS — J449 Chronic obstructive pulmonary disease, unspecified: Secondary | ICD-10-CM | POA: Diagnosis not present

## 2023-10-28 DIAGNOSIS — R9431 Abnormal electrocardiogram [ECG] [EKG]: Secondary | ICD-10-CM | POA: Diagnosis not present

## 2023-10-28 DIAGNOSIS — C3491 Malignant neoplasm of unspecified part of right bronchus or lung: Secondary | ICD-10-CM | POA: Diagnosis not present

## 2023-10-28 DIAGNOSIS — R791 Abnormal coagulation profile: Secondary | ICD-10-CM | POA: Diagnosis not present

## 2023-10-28 DIAGNOSIS — Z87891 Personal history of nicotine dependence: Secondary | ICD-10-CM | POA: Diagnosis not present

## 2023-10-28 DIAGNOSIS — E7800 Pure hypercholesterolemia, unspecified: Secondary | ICD-10-CM | POA: Diagnosis not present

## 2023-10-28 DIAGNOSIS — I10 Essential (primary) hypertension: Secondary | ICD-10-CM | POA: Diagnosis not present

## 2023-10-28 DIAGNOSIS — I2510 Atherosclerotic heart disease of native coronary artery without angina pectoris: Secondary | ICD-10-CM | POA: Diagnosis not present

## 2023-10-28 DIAGNOSIS — N1832 Chronic kidney disease, stage 3b: Secondary | ICD-10-CM | POA: Diagnosis not present

## 2023-10-28 DIAGNOSIS — M4316 Spondylolisthesis, lumbar region: Secondary | ICD-10-CM | POA: Diagnosis not present

## 2023-10-28 DIAGNOSIS — Z79899 Other long term (current) drug therapy: Secondary | ICD-10-CM | POA: Diagnosis not present

## 2023-10-29 DIAGNOSIS — R0902 Hypoxemia: Secondary | ICD-10-CM | POA: Diagnosis not present

## 2023-11-03 DIAGNOSIS — I10 Essential (primary) hypertension: Secondary | ICD-10-CM | POA: Diagnosis not present

## 2023-11-03 DIAGNOSIS — R0602 Shortness of breath: Secondary | ICD-10-CM | POA: Diagnosis not present

## 2023-11-03 DIAGNOSIS — N1832 Chronic kidney disease, stage 3b: Secondary | ICD-10-CM | POA: Diagnosis not present

## 2023-11-03 DIAGNOSIS — I2510 Atherosclerotic heart disease of native coronary artery without angina pectoris: Secondary | ICD-10-CM | POA: Diagnosis not present

## 2023-11-03 DIAGNOSIS — J449 Chronic obstructive pulmonary disease, unspecified: Secondary | ICD-10-CM | POA: Diagnosis not present

## 2023-11-03 DIAGNOSIS — J439 Emphysema, unspecified: Secondary | ICD-10-CM | POA: Diagnosis not present

## 2023-11-03 DIAGNOSIS — C3491 Malignant neoplasm of unspecified part of right bronchus or lung: Secondary | ICD-10-CM | POA: Diagnosis not present

## 2023-11-10 DIAGNOSIS — R400 Somnolence: Secondary | ICD-10-CM | POA: Diagnosis not present

## 2023-11-10 DIAGNOSIS — R29818 Other symptoms and signs involving the nervous system: Secondary | ICD-10-CM | POA: Diagnosis not present

## 2023-11-10 DIAGNOSIS — R0683 Snoring: Secondary | ICD-10-CM | POA: Diagnosis not present

## 2023-11-10 DIAGNOSIS — Z85118 Personal history of other malignant neoplasm of bronchus and lung: Secondary | ICD-10-CM | POA: Diagnosis not present

## 2023-11-10 DIAGNOSIS — J329 Chronic sinusitis, unspecified: Secondary | ICD-10-CM | POA: Diagnosis not present

## 2023-11-10 DIAGNOSIS — J432 Centrilobular emphysema: Secondary | ICD-10-CM | POA: Diagnosis not present

## 2023-11-10 DIAGNOSIS — Z8546 Personal history of malignant neoplasm of prostate: Secondary | ICD-10-CM | POA: Diagnosis not present

## 2023-12-02 DIAGNOSIS — R801 Persistent proteinuria, unspecified: Secondary | ICD-10-CM | POA: Diagnosis not present

## 2023-12-02 DIAGNOSIS — N183 Chronic kidney disease, stage 3 unspecified: Secondary | ICD-10-CM | POA: Diagnosis not present

## 2023-12-02 DIAGNOSIS — I129 Hypertensive chronic kidney disease with stage 1 through stage 4 chronic kidney disease, or unspecified chronic kidney disease: Secondary | ICD-10-CM | POA: Diagnosis not present

## 2023-12-08 ENCOUNTER — Ambulatory Visit (HOSPITAL_COMMUNITY)
Admission: RE | Admit: 2023-12-08 | Discharge: 2023-12-08 | Disposition: A | Discharge status: Home / Self Care | Source: Ambulatory Visit | Attending: Internal Medicine | Admitting: Internal Medicine

## 2023-12-08 ENCOUNTER — Inpatient Hospital Stay: Attending: Internal Medicine

## 2023-12-08 DIAGNOSIS — C3490 Malignant neoplasm of unspecified part of unspecified bronchus or lung: Secondary | ICD-10-CM

## 2023-12-08 DIAGNOSIS — C61 Malignant neoplasm of prostate: Secondary | ICD-10-CM | POA: Insufficient documentation

## 2023-12-08 DIAGNOSIS — J432 Centrilobular emphysema: Secondary | ICD-10-CM | POA: Diagnosis not present

## 2023-12-08 DIAGNOSIS — Z79899 Other long term (current) drug therapy: Secondary | ICD-10-CM | POA: Diagnosis not present

## 2023-12-08 DIAGNOSIS — C3411 Malignant neoplasm of upper lobe, right bronchus or lung: Secondary | ICD-10-CM | POA: Insufficient documentation

## 2023-12-08 DIAGNOSIS — J8410 Pulmonary fibrosis, unspecified: Secondary | ICD-10-CM | POA: Diagnosis not present

## 2023-12-08 DIAGNOSIS — N189 Chronic kidney disease, unspecified: Secondary | ICD-10-CM | POA: Diagnosis not present

## 2023-12-08 DIAGNOSIS — I700 Atherosclerosis of aorta: Secondary | ICD-10-CM | POA: Diagnosis not present

## 2023-12-08 LAB — CBC WITH DIFFERENTIAL (CANCER CENTER ONLY)
Abs Immature Granulocytes: 0.01 K/uL (ref 0.00–0.07)
Basophils Absolute: 0.1 K/uL (ref 0.0–0.1)
Basophils Relative: 1 %
Eosinophils Absolute: 0.4 K/uL (ref 0.0–0.5)
Eosinophils Relative: 6 %
HCT: 36.9 % — ABNORMAL LOW (ref 39.0–52.0)
Hemoglobin: 12.2 g/dL — ABNORMAL LOW (ref 13.0–17.0)
Immature Granulocytes: 0 %
Lymphocytes Relative: 32 %
Lymphs Abs: 2.3 K/uL (ref 0.7–4.0)
MCH: 29.1 pg (ref 26.0–34.0)
MCHC: 33.1 g/dL (ref 30.0–36.0)
MCV: 88.1 fL (ref 80.0–100.0)
Monocytes Absolute: 0.8 K/uL (ref 0.1–1.0)
Monocytes Relative: 11 %
Neutro Abs: 3.7 K/uL (ref 1.7–7.7)
Neutrophils Relative %: 50 %
Platelet Count: 292 K/uL (ref 150–400)
RBC: 4.19 MIL/uL — ABNORMAL LOW (ref 4.22–5.81)
RDW: 14.5 % (ref 11.5–15.5)
WBC Count: 7.4 K/uL (ref 4.0–10.5)
nRBC: 0 % (ref 0.0–0.2)

## 2023-12-08 LAB — CMP (CANCER CENTER ONLY)
ALT: 12 U/L (ref 0–44)
AST: 15 U/L (ref 15–41)
Albumin: 3.8 g/dL (ref 3.5–5.0)
Alkaline Phosphatase: 83 U/L (ref 38–126)
Anion gap: 7 (ref 5–15)
BUN: 19 mg/dL (ref 8–23)
CO2: 25 mmol/L (ref 22–32)
Calcium: 9.4 mg/dL (ref 8.9–10.3)
Chloride: 109 mmol/L (ref 98–111)
Creatinine: 1.58 mg/dL — ABNORMAL HIGH (ref 0.61–1.24)
GFR, Estimated: 46 mL/min — ABNORMAL LOW (ref >60)
Glucose, Bld: 88 mg/dL (ref 70–99)
Potassium: 4 mmol/L (ref 3.5–5.1)
Sodium: 141 mmol/L (ref 135–145)
Total Bilirubin: 0.3 mg/dL (ref 0.0–1.2)
Total Protein: 7.9 g/dL (ref 6.5–8.1)

## 2023-12-09 ENCOUNTER — Ambulatory Visit (HOSPITAL_COMMUNITY)

## 2023-12-09 ENCOUNTER — Inpatient Hospital Stay

## 2023-12-15 DIAGNOSIS — J432 Centrilobular emphysema: Secondary | ICD-10-CM | POA: Diagnosis not present

## 2023-12-16 ENCOUNTER — Inpatient Hospital Stay: Admitting: Internal Medicine

## 2023-12-16 VITALS — BP 131/87 | HR 95 | Temp 98.2°F | Resp 17 | Ht 67.0 in | Wt 212.0 lb

## 2023-12-16 DIAGNOSIS — C61 Malignant neoplasm of prostate: Secondary | ICD-10-CM | POA: Diagnosis not present

## 2023-12-16 DIAGNOSIS — C3411 Malignant neoplasm of upper lobe, right bronchus or lung: Secondary | ICD-10-CM | POA: Diagnosis not present

## 2023-12-16 DIAGNOSIS — Z79899 Other long term (current) drug therapy: Secondary | ICD-10-CM | POA: Diagnosis not present

## 2023-12-16 DIAGNOSIS — C3490 Malignant neoplasm of unspecified part of unspecified bronchus or lung: Secondary | ICD-10-CM | POA: Diagnosis not present

## 2023-12-16 DIAGNOSIS — N189 Chronic kidney disease, unspecified: Secondary | ICD-10-CM | POA: Diagnosis not present

## 2023-12-16 NOTE — Progress Notes (Signed)
 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   PRIOR THERAPY: 1) Concurrent chemoradiation with weekly carboplatin  for AUC of 2 and paclitaxel  45 Mg/M2. Status post 7 cycles. First dose on 07/04/20 and last cycle was giving August 14, 2020 with partial response.  2) Consolidation treatment with immunotherapy with Imfinzi  1500 Mg IV every 4 weeks. Last dose 08/23/21. Status post 13 cycles.   CURRENT THERAPY: Observation  INTERVAL HISTORY: Jason [name]. 73 y.o. male returns to the clinic today for follow-up visit.Discussed the use of AI scribe software for clinical note transcription with the patient, who gave verbal consent to proceed.  History of Present Illness Jason [name]. is a 73 year old male with lung cancer who presents for evaluation and repeat CT scan of the chest for restaging of his disease.  He completed a course of concurrent chemoradiation with weekly carboplatin  and paclitaxel  followed by one year of consolidation treatment with immunotherapy, which was completed in June 2024. He has been on observation since that time.  Recently, his scheduled back surgery was canceled due to concerns about his oxygen saturation, which was noted to be in the 80s. However, subsequent checks in the emergency department showed his oxygen saturation at 100%, and he remained there for three hours with stable readings.  He received a flu shot and pneumonia shot approximately one and a half weeks ago, after which he developed significant chest congestion. The  congestion has since improved.  He reports fluctuations in his weight, stating 'I lose, I gain,' but overall describes his weight as stable.     MEDICAL HISTORY: Past Medical History:  Diagnosis Date   Arthritis    back   Hypertension    Prostate cancer (HCC)    Tobacco use     ALLERGIES:  has no known allergies.  MEDICATIONS:  Current Outpatient Medications  Medication Sig Dispense Refill   acetaminophen  (TYLENOL ) 325 MG tablet Take 650 mg by mouth every 6 (six) hours as needed. (Patient not taking: Reported on 08/07/2022)     atorvastatin (LIPITOR) 10 MG tablet Take 10 mg by mouth daily.     azelastine (ASTELIN) 0.1 % nasal spray Place 1 spray into both nostrils 2 (two) times daily as needed for rhinitis. Use in each nostril as directed     fluticasone (FLONASE) 50 MCG/ACT nasal spray Place 2 sprays into both nostrils daily.     levocetirizine (XYZAL) 5 MG tablet Take 5 mg by mouth every evening.     methocarbamol  (ROBAXIN ) 500 MG tablet Take 1 tablet (500 mg total) by mouth 2 (two) times daily. 20 tablet 0   montelukast (SINGULAIR) 10 MG tablet Take 10 mg by mouth at bedtime.     oxyCODONE -acetaminophen  (PERCOCET/ROXICET) 5-325 MG tablet Take 1 tablet by mouth every 6 (six) hours as needed for severe pain. 15 tablet 0   valsartan-hydrochlorothiazide (DIOVAN-HCT) 320-25 MG tablet Take 1 tablet by mouth daily.     venlafaxine (EFFEXOR) 25 MG tablet 1 tablet with food     No current facility-administered medications for this visit.  SURGICAL HISTORY:  Past Surgical History:  Procedure Laterality Date   BACK SURGERY     BRONCHIAL BIOPSY  06/06/2020   Procedure: BRONCHIAL BIOPSIES;  Surgeon: Brenna Adine CROME, DO;  Location: [facility];  Service: Pulmonary;;   BRONCHIAL BRUSHINGS  06/06/2020   Procedure: BRONCHIAL BRUSHINGS;  Surgeon: Brenna Adine CROME, DO;  Location: [facility];  Service: Pulmonary;;   BRONCHIAL NEEDLE ASPIRATION BIOPSY  06/06/2020   Procedure: BRONCHIAL  NEEDLE ASPIRATION BIOPSIES;  Surgeon: Brenna Adine CROME, DO;  Location: [facility];  Service: Pulmonary;;   BRONCHIAL WASHINGS  06/06/2020   Procedure: BRONCHIAL WASHINGS;  Surgeon: Brenna Adine CROME, DO;  Location: [facility];  Service: Pulmonary;;   COLONOSCOPY     OTHER SURGICAL HISTORY     fluid drawn off his testicle   PROSTATE BIOPSY     ROTATOR CUFF REPAIR Right    VIDEO BRONCHOSCOPY WITH ENDOBRONCHIAL NAVIGATION Right 06/06/2020   Procedure: VIDEO BRONCHOSCOPY WITH ENDOBRONCHIAL NAVIGATION;  Surgeon: Brenna Adine CROME, DO;  Location: [facility];  Service: Pulmonary;  Laterality: Right;   VIDEO BRONCHOSCOPY WITH ENDOBRONCHIAL ULTRASOUND Bilateral 06/06/2020   Procedure: VIDEO BRONCHOSCOPY WITH ENDOBRONCHIAL ULTRASOUND;  Surgeon: Brenna Adine CROME, DO;  Location: [facility];  Service: Pulmonary;  Laterality: Bilateral;   WRIST SURGERY      REVIEW OF SYSTEMS:  A comprehensive review of systems was negative except for: Constitutional: positive for fatigue Respiratory: positive for dyspnea on exertion   PHYSICAL EXAMINATION: General appearance: alert, cooperative, and no distress Head: Normocephalic, without obvious abnormality, atraumatic Neck: no adenopathy, no JVD, supple, symmetrical, trachea midline, and thyroid  not enlarged, symmetric, no tenderness/mass/nodules Lymph nodes: Cervical, supraclavicular, and axillary nodes normal. Resp: clear to auscultation bilaterally Back: symmetric, no curvature. ROM normal. No CVA tenderness. Cardio: regular rate and rhythm, S1, S2 normal, no murmur, click, rub or gallop GI: soft, non-tender; bowel sounds normal; no masses,  no organomegaly Extremities: extremities normal, atraumatic, no cyanosis or edema  ECOG PERFORMANCE STATUS: 1 - Symptomatic but completely ambulatory  Blood pressure 131/87, pulse 95, temperature 98.2 F (36.8 C), temperature source Temporal, resp. rate 17, height 5' 7 (1.702 m), weight 212 lb (96.2 kg), SpO2  99%.  LABORATORY DATA: Lab Results  Component Value Date   WBC 7.4 12/08/2023   HGB 12.2 (L) 12/08/2023   HCT 36.9 (L) 12/08/2023   MCV 88.1 12/08/2023   PLT 292 12/08/2023      Chemistry      Component Value Date/Time   NA 141 12/08/2023 1523   K 4.0 12/08/2023 1523   CL 109 12/08/2023 1523   CO2 25 12/08/2023 1523   BUN 19 12/08/2023 1523   CREATININE 1.58 (H) 12/08/2023 1523      Component Value Date/Time   CALCIUM 9.4 12/08/2023 1523   ALKPHOS 83 12/08/2023 1523   AST 15 12/08/2023 1523   ALT 12 12/08/2023 1523   BILITOT 0.3 12/08/2023 1523       RADIOGRAPHIC STUDIES: CT Chest Wo Contrast Result Date: 12/11/2023 CLINICAL DATA:  Non-small cell lung cancer, staging. * Tracking Code: BO * EXAM: CT CHEST WITHOUT CONTRAST TECHNIQUE: Multidetector CT imaging of the chest was performed following the standard protocol without IV contrast. RADIATION DOSE REDUCTION: This exam was performed according to the departmental dose-optimization program which includes automated exposure control, adjustment of the mA and/or kV according to patient size and/or use of iterative reconstruction technique. COMPARISON:  Prior CTs 06/09/2023 and 12/05/2022. FINDINGS: Cardiovascular: Mild aortic and coronary artery atherosclerosis. Stable central  enlargement of the pulmonary arteries. The heart size is normal. There is no pericardial effusion. Mediastinum/Nodes: Stable mildly prominent mediastinal lymph nodes. No progressive thoracic adenopathy. The thyroid  gland, trachea and esophagus demonstrate no significant findings. Lungs/Pleura: No pleural effusion or pneumothorax. Centrilobular and paraseptal emphysema with diffuse central airway thickening. There are stable radiation changes medially in the right upper lobe without evidence of local recurrence. Small thick-walled cavitary lesion in the right lower lobe measuring 8 x 7 mm on image 95/6 appears slightly more conspicuous with slight wall thickening,  although is grossly unchanged in overall size from multiple prior studies dating back to at least 03/05/2021. No associated significant soft tissue components. There are additional scattered tiny pulmonary nodules bilaterally with stable scarring at both lung bases. Upper abdomen:  Appears stable, without significant findings. Musculoskeletal/Chest wall: There is no chest wall mass or suspicious osseous finding. Previous lower cervical fusion. Multilevel thoracic spondylosis. IMPRESSION: 1. Stable radiation changes medially in the right upper lobe without evidence of local recurrence. 2. Small thick-walled cavitary lesion in the right lower lobe appears slightly more conspicuous with slight wall thickening, although is grossly unchanged in overall size from multiple prior studies dating back to at least 03/05/2021. This favors a benign etiology with subtle changes potentially secondary to superimposed inflammation. Recommend attention on follow-up. 3. No evidence of progressive metastatic disease. 4. Stable pulmonary fibrotic changes and findings of pulmonary arterial hypertension. 5. Aortic Atherosclerosis (ICD10-I70.0) and Emphysema (ICD10-J43.9). Electronically Signed   By: Elsie Perone M.D.   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  The patient is status post a course of concurrent chemoradiation with weekly carboplatin  and paclitaxel  for 7 cycles with partial response followed by 1 year treatment with consolidation immunotherapy with Imfinzi  1500 Mg IV every 4 weeks completed on August 23, 2021.  The patient is currently on observation and he is feeling fine. He had repeat CT scan of the chest performed recently.   I personally independently reviewed the scan and discussed the result with the patient today.  His scan showed no concerning findings for disease progression. Assessment and Plan Assessment & Plan Lung cancer, post-treatment, under surveillance Completed concurrent chemoradiation with weekly carboplatin  and paclitaxel  followed by one year of consolidation immunotherapy in June 2024. Currently under surveillance with no evidence of disease progression on recent chest CT scan. - Continue surveillance with follow-up in six months.  Pulmonary inflammation Recent pulmonary inflammation likely secondary to recent flu and pneumonia vaccinations, presenting with chest congestion. Symptoms have improved. The patient was advised to call immediately if he has any concerning symptoms in the interval.  The patient voices understanding of current disease status and treatment options and is in agreement with the current care plan.  All questions were answered. The patient knows to call the clinic with any problems, questions or concerns. We can certainly see the patient much sooner if necessary. The total time spent in the appointment was 20 minutes including review of chart and various tests results, discussions about plan of care and coordination of care plan.   Disclaimer: This note was dictated with voice recognition software. Similar sounding words can inadvertently be transcribed and may not be corrected upon review.

## 2023-12-19 DIAGNOSIS — G4733 Obstructive sleep apnea (adult) (pediatric): Secondary | ICD-10-CM | POA: Diagnosis not present

## 2023-12-19 DIAGNOSIS — J439 Emphysema, unspecified: Secondary | ICD-10-CM | POA: Diagnosis not present

## 2023-12-19 DIAGNOSIS — C3491 Malignant neoplasm of unspecified part of right bronchus or lung: Secondary | ICD-10-CM | POA: Diagnosis not present

## 2023-12-19 DIAGNOSIS — M5431 Sciatica, right side: Secondary | ICD-10-CM | POA: Diagnosis not present

## 2023-12-22 DIAGNOSIS — R29818 Other symptoms and signs involving the nervous system: Secondary | ICD-10-CM | POA: Diagnosis not present

## 2023-12-22 DIAGNOSIS — J432 Centrilobular emphysema: Secondary | ICD-10-CM | POA: Diagnosis not present

## 2023-12-22 DIAGNOSIS — Z85118 Personal history of other malignant neoplasm of bronchus and lung: Secondary | ICD-10-CM | POA: Diagnosis not present

## 2023-12-22 DIAGNOSIS — J302 Other seasonal allergic rhinitis: Secondary | ICD-10-CM | POA: Diagnosis not present

## 2023-12-22 DIAGNOSIS — R0602 Shortness of breath: Secondary | ICD-10-CM | POA: Diagnosis not present

## 2023-12-31 DIAGNOSIS — G4733 Obstructive sleep apnea (adult) (pediatric): Secondary | ICD-10-CM | POA: Diagnosis not present
